# Patient Record
Sex: Female | Born: 1985 | Race: Black or African American | Hispanic: No | Marital: Single | Smoking: Former smoker
Health system: Southern US, Community
[De-identification: ages and names within clinical notes are randomized; demographics above are authoritative.]

## PROBLEM LIST (undated history)

## (undated) ENCOUNTER — Inpatient Hospital Stay (HOSPITAL_COMMUNITY): Payer: No Typology Code available for payment source

## (undated) DIAGNOSIS — G43909 Migraine, unspecified, not intractable, without status migrainosus: Secondary | ICD-10-CM

## (undated) DIAGNOSIS — D649 Anemia, unspecified: Secondary | ICD-10-CM

## (undated) DIAGNOSIS — Z8709 Personal history of other diseases of the respiratory system: Secondary | ICD-10-CM

## (undated) DIAGNOSIS — A630 Anogenital (venereal) warts: Secondary | ICD-10-CM

## (undated) DIAGNOSIS — A749 Chlamydial infection, unspecified: Secondary | ICD-10-CM

## (undated) DIAGNOSIS — I10 Essential (primary) hypertension: Secondary | ICD-10-CM

## (undated) DIAGNOSIS — K219 Gastro-esophageal reflux disease without esophagitis: Secondary | ICD-10-CM

## (undated) DIAGNOSIS — F329 Major depressive disorder, single episode, unspecified: Secondary | ICD-10-CM

## (undated) DIAGNOSIS — F32A Depression, unspecified: Secondary | ICD-10-CM

## (undated) DIAGNOSIS — K469 Unspecified abdominal hernia without obstruction or gangrene: Secondary | ICD-10-CM

## (undated) DIAGNOSIS — Z8669 Personal history of other diseases of the nervous system and sense organs: Secondary | ICD-10-CM

## (undated) DIAGNOSIS — O139 Gestational [pregnancy-induced] hypertension without significant proteinuria, unspecified trimester: Secondary | ICD-10-CM

## (undated) DIAGNOSIS — F419 Anxiety disorder, unspecified: Secondary | ICD-10-CM

## (undated) DIAGNOSIS — D573 Sickle-cell trait: Secondary | ICD-10-CM

## (undated) DIAGNOSIS — A599 Trichomoniasis, unspecified: Secondary | ICD-10-CM

## (undated) DIAGNOSIS — G479 Sleep disorder, unspecified: Secondary | ICD-10-CM

## (undated) DIAGNOSIS — E785 Hyperlipidemia, unspecified: Secondary | ICD-10-CM

## (undated) DIAGNOSIS — R87619 Unspecified abnormal cytological findings in specimens from cervix uteri: Secondary | ICD-10-CM

## (undated) DIAGNOSIS — B009 Herpesviral infection, unspecified: Secondary | ICD-10-CM

## (undated) DIAGNOSIS — R7989 Other specified abnormal findings of blood chemistry: Secondary | ICD-10-CM

## (undated) HISTORY — DX: Migraine, unspecified, not intractable, without status migrainosus: G43.909

## (undated) HISTORY — DX: Sleep disorder, unspecified: G47.9

## (undated) HISTORY — DX: Unspecified abnormal cytological findings in specimens from cervix uteri: R87.619

## (undated) HISTORY — DX: Trichomoniasis, unspecified: A59.9

## (undated) HISTORY — PX: INTRAUTERINE DEVICE (IUD) INSERTION: SHX5877

## (undated) HISTORY — DX: Other specified abnormal findings of blood chemistry: R79.89

## (undated) HISTORY — DX: Gastro-esophageal reflux disease without esophagitis: K21.9

## (undated) HISTORY — DX: Anogenital (venereal) warts: A63.0

## (undated) HISTORY — DX: Unspecified abdominal hernia without obstruction or gangrene: K46.9

## (undated) HISTORY — DX: Hyperlipidemia, unspecified: E78.5

## (undated) HISTORY — DX: Anemia, unspecified: D64.9

## (undated) HISTORY — PX: EYE SURGERY: SHX253

## (undated) HISTORY — DX: Chlamydial infection, unspecified: A74.9

## (undated) HISTORY — DX: Herpesviral infection, unspecified: B00.9

---
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
## 2006-01-19 ENCOUNTER — Emergency Department (HOSPITAL_COMMUNITY): Admission: EM | Admit: 2006-01-19 | Discharge: 2006-01-19 | Payer: Self-pay | Admitting: Emergency Medicine

## 2006-05-12 ENCOUNTER — Other Ambulatory Visit: Admission: RE | Admit: 2006-05-12 | Discharge: 2006-05-12 | Payer: Self-pay | Admitting: Obstetrics and Gynecology

## 2006-06-18 ENCOUNTER — Emergency Department (HOSPITAL_COMMUNITY): Admission: EM | Admit: 2006-06-18 | Discharge: 2006-06-18 | Payer: Self-pay | Admitting: Emergency Medicine

## 2006-08-15 ENCOUNTER — Ambulatory Visit (HOSPITAL_COMMUNITY): Admission: RE | Admit: 2006-08-15 | Discharge: 2006-08-15 | Payer: Self-pay | Admitting: Obstetrics and Gynecology

## 2006-10-28 ENCOUNTER — Ambulatory Visit (HOSPITAL_COMMUNITY): Admission: RE | Admit: 2006-10-28 | Discharge: 2006-10-28 | Payer: Self-pay | Admitting: Obstetrics and Gynecology

## 2006-11-10 ENCOUNTER — Ambulatory Visit (HOSPITAL_COMMUNITY): Admission: RE | Admit: 2006-11-10 | Discharge: 2006-11-10 | Payer: Self-pay | Admitting: Obstetrics and Gynecology

## 2006-12-14 ENCOUNTER — Emergency Department (HOSPITAL_COMMUNITY): Admission: EM | Admit: 2006-12-14 | Discharge: 2006-12-15 | Payer: Self-pay | Admitting: Emergency Medicine

## 2007-01-03 ENCOUNTER — Inpatient Hospital Stay (HOSPITAL_COMMUNITY): Admission: RE | Admit: 2007-01-03 | Discharge: 2007-01-10 | Payer: Self-pay | Admitting: Obstetrics and Gynecology

## 2008-01-27 ENCOUNTER — Emergency Department (HOSPITAL_COMMUNITY): Admission: EM | Admit: 2008-01-27 | Discharge: 2008-01-27 | Payer: Self-pay | Admitting: Emergency Medicine

## 2008-08-02 ENCOUNTER — Emergency Department (HOSPITAL_COMMUNITY): Admission: EM | Admit: 2008-08-02 | Discharge: 2008-08-02 | Payer: Self-pay | Admitting: Emergency Medicine

## 2008-12-02 ENCOUNTER — Emergency Department (HOSPITAL_COMMUNITY): Admission: EM | Admit: 2008-12-02 | Discharge: 2008-12-02 | Payer: Self-pay | Admitting: Emergency Medicine

## 2008-12-26 ENCOUNTER — Inpatient Hospital Stay (HOSPITAL_COMMUNITY): Admission: AD | Admit: 2008-12-26 | Discharge: 2008-12-27 | Payer: Self-pay | Admitting: Family Medicine

## 2008-12-28 ENCOUNTER — Ambulatory Visit (HOSPITAL_COMMUNITY): Admission: AD | Admit: 2008-12-28 | Discharge: 2008-12-28 | Payer: Self-pay | Admitting: Obstetrics & Gynecology

## 2008-12-29 ENCOUNTER — Ambulatory Visit (HOSPITAL_COMMUNITY): Admission: AD | Admit: 2008-12-29 | Discharge: 2008-12-29 | Payer: Self-pay | Admitting: Obstetrics & Gynecology

## 2009-01-08 ENCOUNTER — Inpatient Hospital Stay (HOSPITAL_COMMUNITY): Admission: RE | Admit: 2009-01-08 | Discharge: 2009-01-08 | Payer: Self-pay | Admitting: Obstetrics & Gynecology

## 2009-03-09 ENCOUNTER — Inpatient Hospital Stay (HOSPITAL_COMMUNITY): Admission: AD | Admit: 2009-03-09 | Discharge: 2009-03-09 | Payer: Self-pay | Admitting: Obstetrics and Gynecology

## 2009-04-13 ENCOUNTER — Inpatient Hospital Stay (HOSPITAL_COMMUNITY): Admission: AD | Admit: 2009-04-13 | Discharge: 2009-04-13 | Payer: Self-pay | Admitting: Obstetrics and Gynecology

## 2009-07-20 ENCOUNTER — Ambulatory Visit: Payer: Self-pay | Admitting: Physician Assistant

## 2009-07-20 ENCOUNTER — Inpatient Hospital Stay (HOSPITAL_COMMUNITY): Admission: AD | Admit: 2009-07-20 | Discharge: 2009-07-20 | Payer: Self-pay | Admitting: Obstetrics and Gynecology

## 2009-08-27 ENCOUNTER — Inpatient Hospital Stay (HOSPITAL_COMMUNITY): Admission: AD | Admit: 2009-08-27 | Discharge: 2009-08-27 | Payer: Self-pay | Admitting: Obstetrics & Gynecology

## 2009-08-27 ENCOUNTER — Inpatient Hospital Stay (HOSPITAL_COMMUNITY): Admission: AD | Admit: 2009-08-27 | Discharge: 2009-08-30 | Payer: Self-pay | Admitting: Obstetrics and Gynecology

## 2009-08-31 ENCOUNTER — Inpatient Hospital Stay (HOSPITAL_COMMUNITY): Admission: AD | Admit: 2009-08-31 | Discharge: 2009-09-03 | Payer: Self-pay | Admitting: Obstetrics and Gynecology

## 2009-09-22 ENCOUNTER — Inpatient Hospital Stay (HOSPITAL_COMMUNITY): Admission: AD | Admit: 2009-09-22 | Discharge: 2009-09-22 | Payer: Self-pay | Admitting: Obstetrics & Gynecology

## 2009-11-11 ENCOUNTER — Encounter: Admission: RE | Admit: 2009-11-11 | Discharge: 2009-11-11 | Payer: Self-pay | Admitting: Gastroenterology

## 2010-02-15 HISTORY — PX: OVARIAN CYST REMOVAL: SHX89

## 2010-03-01 ENCOUNTER — Inpatient Hospital Stay (HOSPITAL_COMMUNITY)
Admission: AD | Admit: 2010-03-01 | Discharge: 2010-03-01 | Payer: Self-pay | Source: Home / Self Care | Attending: Obstetrics & Gynecology | Admitting: Obstetrics & Gynecology

## 2010-03-02 LAB — URINALYSIS, ROUTINE W REFLEX MICROSCOPIC
Bilirubin Urine: NEGATIVE
Hgb urine dipstick: NEGATIVE
Ketones, ur: NEGATIVE mg/dL
Nitrite: NEGATIVE
Protein, ur: NEGATIVE mg/dL
Specific Gravity, Urine: 1.02 (ref 1.005–1.030)
Urine Glucose, Fasting: NEGATIVE mg/dL
Urobilinogen, UA: 0.2 mg/dL (ref 0.0–1.0)
pH: 5.5 (ref 5.0–8.0)

## 2010-03-02 LAB — POCT PREGNANCY, URINE: Preg Test, Ur: NEGATIVE

## 2010-03-26 ENCOUNTER — Encounter (HOSPITAL_COMMUNITY)
Admission: RE | Admit: 2010-03-26 | Discharge: 2010-03-26 | Disposition: A | Discharge status: Home / Self Care | Payer: Medicaid Other | Source: Ambulatory Visit | Attending: Obstetrics and Gynecology | Admitting: Obstetrics and Gynecology

## 2010-03-26 DIAGNOSIS — Z01812 Encounter for preprocedural laboratory examination: Secondary | ICD-10-CM | POA: Insufficient documentation

## 2010-03-26 LAB — SURGICAL PCR SCREEN
MRSA, PCR: NEGATIVE
Staphylococcus aureus: NEGATIVE

## 2010-03-26 LAB — COMPREHENSIVE METABOLIC PANEL
ALT: 14 U/L (ref 0–35)
AST: 16 U/L (ref 0–37)
Albumin: 3.6 g/dL (ref 3.5–5.2)
Alkaline Phosphatase: 62 U/L (ref 39–117)
BUN: 14 mg/dL (ref 6–23)
CO2: 25 mEq/L (ref 19–32)
Calcium: 8.7 mg/dL (ref 8.4–10.5)
Chloride: 108 mEq/L (ref 96–112)
Creatinine, Ser: 0.74 mg/dL (ref 0.4–1.2)
GFR calc Af Amer: >60 mL/min (ref >60)
GFR calc non Af Amer: >60 mL/min (ref >60)
Glucose, Bld: 75 mg/dL (ref 70–99)
Potassium: 3.9 mEq/L (ref 3.5–5.1)
Sodium: 138 mEq/L (ref 135–145)
Total Bilirubin: 0.5 mg/dL (ref 0.3–1.2)
Total Protein: 6.7 g/dL (ref 6.0–8.3)

## 2010-03-26 LAB — CBC
HCT: 35.9 % — ABNORMAL LOW (ref 36.0–46.0)
Hemoglobin: 11.7 g/dL — ABNORMAL LOW (ref 12.0–15.0)
MCH: 26.9 pg (ref 26.0–34.0)
MCHC: 32.6 g/dL (ref 30.0–36.0)
MCV: 82.5 fL (ref 78.0–100.0)
Platelets: 279 10*3/uL (ref 150–400)
RBC: 4.35 MIL/uL (ref 3.87–5.11)
RDW: 15.5 % (ref 11.5–15.5)
WBC: 5.4 10*3/uL (ref 4.0–10.5)

## 2010-03-26 LAB — URINALYSIS, ROUTINE W REFLEX MICROSCOPIC
Bilirubin Urine: NEGATIVE
Hgb urine dipstick: NEGATIVE
Ketones, ur: NEGATIVE mg/dL
Nitrite: NEGATIVE
Protein, ur: NEGATIVE mg/dL
Specific Gravity, Urine: 1.01 (ref 1.005–1.030)
Urine Glucose, Fasting: NEGATIVE mg/dL
Urobilinogen, UA: 0.2 mg/dL (ref 0.0–1.0)
pH: 6.5 (ref 5.0–8.0)

## 2010-03-27 ENCOUNTER — Inpatient Hospital Stay (INDEPENDENT_AMBULATORY_CARE_PROVIDER_SITE_OTHER)
Admission: RE | Admit: 2010-03-27 | Discharge: 2010-03-27 | Disposition: A | Discharge status: Home / Self Care | Payer: Medicaid Other | Source: Ambulatory Visit | Attending: Family Medicine | Admitting: Family Medicine

## 2010-03-27 ENCOUNTER — Inpatient Hospital Stay (HOSPITAL_COMMUNITY)
Admission: RE | Admit: 2010-03-27 | Discharge: 2010-03-27 | Disposition: A | Discharge status: Home / Self Care | Payer: Medicaid Other | Source: Ambulatory Visit

## 2010-03-27 DIAGNOSIS — J029 Acute pharyngitis, unspecified: Secondary | ICD-10-CM

## 2010-03-27 LAB — POCT RAPID STREP A (OFFICE): Streptococcus, Group A Screen (Direct): NEGATIVE

## 2010-03-30 ENCOUNTER — Ambulatory Visit (HOSPITAL_COMMUNITY)
Admission: RE | Admit: 2010-03-30 | Payer: Medicaid Other | Source: Ambulatory Visit | Admitting: Obstetrics and Gynecology

## 2010-04-07 ENCOUNTER — Ambulatory Visit (HOSPITAL_COMMUNITY)
Admission: RE | Admit: 2010-04-07 | Discharge: 2010-04-07 | Disposition: A | Discharge status: Home / Self Care | Payer: Medicaid Other | Source: Ambulatory Visit | Attending: Obstetrics and Gynecology | Admitting: Obstetrics and Gynecology

## 2010-04-07 ENCOUNTER — Other Ambulatory Visit: Payer: Self-pay | Admitting: Obstetrics and Gynecology

## 2010-04-07 DIAGNOSIS — D279 Benign neoplasm of unspecified ovary: Secondary | ICD-10-CM | POA: Insufficient documentation

## 2010-04-07 LAB — PREGNANCY, URINE: Preg Test, Ur: NEGATIVE

## 2010-04-24 NOTE — Op Note (Signed)
NAME:  Toni Warren, SPAID NO.:  [id number]  MEDICAL RECORD NO.:  [id number]           PATIENT TYPE:  O  LOCATION:  [facility]                          [facility]  PHYSICIAN:  Randye Lobo, M.D.   DATE OF BIRTH:  07-13-1985  DATE OF PROCEDURE:  04/07/2010 DATE OF DISCHARGE:  04/07/2010                              OPERATIVE REPORT   PREOPERATIVE DIAGNOSIS:  Right ovarian dermoid cyst.  POSTOPERATIVE DIAGNOSIS:  Right ovarian dermoid cyst.  PROCEDURE:  Laparoscopic right ovarian cystectomy.  SURGEON:  Randye Lobo, MD  ASSISTANT:  Luvenia Redden, MD  ANESTHESIA:  General endotracheal, local with 0.25% Marcaine.  IV FLUIDS:  1100 mL Ringer lactate.  ESTIMATED BLOOD LOSS:  100 mL.  URINE OUTPUT:  200 mL.  COMPLICATIONS:  None.  INDICATIONS FOR THE PROCEDURE:  The patient is a 25 year old para 2 African American female, who presents with right lower quadrant pain and a CT scan and a pelvic ultrasound demonstrating a complex right adnexal mass consistent with a 5-cm dermoid cyst.  The patient has had intermittent pain, and she now presents for removal of the dermoid cyst laparoscopically after risks, benefits, and alternatives have been reviewed.  FINDINGS:  Laparoscopy demonstrated a 5-cm right ovarian dermoid cyst. There was also a 1-cm right fallopian tube hydatid cyst.  The uterus, left tube and ovary, appendix, liver, gallbladder all appeared to be normal.  There was no evidence of any adhesive disease nor endometriosis in the abdomen or pelvis.  SPECIMENS:  The right ovarian cyst was sent to Pathology.  PROCEDURE IN DETAIL:  The patient was reidentified in the preoperative hold area.  She received clindamycin 900 mg IV for antibiotic prophylaxis and TED hose for DVT prophylaxis.  In the operating room, general endotracheal anesthesia was induced and the patient was then placed in the dorsal lithotomy position.  The abdomen, vagina, and  perineum were sterilely prepped and a Foley catheter was placed inside the bladder.  A speculum was placed inside the vagina and the single-tooth tenaculum was placed on the anterior cervical lip.  This was replaced with a Hulka tenaculum and the remaining vaginal instruments were removed.  The procedure began by creating a 1-cm infraumbilical incision with a scalpel.  A 10-mm trocar was inserted directly into the peritoneal cavity without difficulty.  The laparoscope confirmed proper placement. A CO2 pneumoperitoneum was achieved, and the patient was placed in the Trendelenburg position.  A 5-mm right and left lower quadrant incisions were then created with a scalpel and 5-mm trocars were placed under direct visualization of the laparoscope.  An inspection of the pelvic and abdominal organs was performed.  The procedure began by grasping the utero-ovarian ligament and then using monopolar cautery to score the ovarian cortex overlying the ovarian cyst.  With a combination of sharp and blunt dissection and monopolar cautery, the ovarian cyst was then shelled out of the normal ovarian tissue.  The cyst remained intact.  Hemostasis at the hilum was created with the Kleppinger bipolar forceps.  The right lower quadrant incision was then extended to a 10 and 11 mm length and  10 and 11-mm disposable trocar was then placed in the right lower quadrant under visualization of the laparoscope.  An EndoCatch bag was placed inside the peritoneal cavity and the specimen was placed inside the bag and was then drawn up and out through the right lower quadrant incision after the cyst was opened to drain the sebaceous material.  The right lower quadrant trocar was then replaced under visualization of the laparoscope and the abdomen was then irrigated and suctioned and hemostasis was good at the operative site.  Intercede was then placed intraperitoneally and wrapped around the right adnexal  structures.  The lower abdominal trocars were removed under visualization of the laparoscope.  The pneumoperitoneum was released, and the umbilical trocar and the 10-mm laparoscope were removed from the umbilicus simultaneously.  The umbilical and right lower quadrant fascial closures were performed with figure-of-eight sutures of 0 Vicryl.  All of the skin incisions were then closed with a subcuticular sutures of 3-0 plain.  Marcaine 0.25% was injected at all of the incisions.  Octylseal was then placed over the right and left lower quadrant incisions and a pressure bandage was placed over the umbilical trocar site.  The tenaculum and the Foley catheter were removed.  I left the operating room at this point when the Anesthesia team began waking up the patient.  I was called back as the patient was noted to have vaginal bleeding.  I placed in a sterile speculum inside the vagina and wiped any remaining blood.  There was no evidence of any active bleeding.  I believe that the bleeding may have been from the tenaculum site, but this was now not showing any signs of active bleeding.  I removed all of these vaginal instruments.  There were no complications to the procedure.  Needle, instrument, sponge counts were correct.  The patient was escorted to the recovery room in stable and awake condition.     Randye Lobo, M.D.     BES/MEDQ  D:  04/07/2010  T:  04/08/2010  Job:  [id number]  Electronically Signed by Conley Simmonds M.D. on 04/23/2010 10:58:15 AM

## 2010-05-01 LAB — URINALYSIS, ROUTINE W REFLEX MICROSCOPIC
Bilirubin Urine: NEGATIVE
Glucose, UA: NEGATIVE mg/dL
Ketones, ur: NEGATIVE mg/dL
Nitrite: NEGATIVE
Protein, ur: NEGATIVE mg/dL
Specific Gravity, Urine: 1.02 (ref 1.005–1.030)
Urobilinogen, UA: 0.2 mg/dL (ref 0.0–1.0)
pH: 5.5 (ref 5.0–8.0)

## 2010-05-01 LAB — URINE MICROSCOPIC-ADD ON

## 2010-05-02 LAB — COMPREHENSIVE METABOLIC PANEL
ALT: 93 U/L — ABNORMAL HIGH (ref 0–35)
ALT: 93 U/L — ABNORMAL HIGH (ref 0–35)
ALT: 94 U/L — ABNORMAL HIGH (ref 0–35)
AST: 122 U/L — ABNORMAL HIGH (ref 0–37)
AST: 133 U/L — ABNORMAL HIGH (ref 0–37)
AST: 88 U/L — ABNORMAL HIGH (ref 0–37)
Albumin: 2.6 g/dL — ABNORMAL LOW (ref 3.5–5.2)
Albumin: 2.8 g/dL — ABNORMAL LOW (ref 3.5–5.2)
Albumin: 3 g/dL — ABNORMAL LOW (ref 3.5–5.2)
Alkaline Phosphatase: 226 U/L — ABNORMAL HIGH (ref 39–117)
Alkaline Phosphatase: 228 U/L — ABNORMAL HIGH (ref 39–117)
Alkaline Phosphatase: 246 U/L — ABNORMAL HIGH (ref 39–117)
BUN: 10 mg/dL (ref 6–23)
BUN: 11 mg/dL (ref 6–23)
BUN: 12 mg/dL (ref 6–23)
CO2: 22 mEq/L (ref 19–32)
CO2: 24 mEq/L (ref 19–32)
CO2: 26 mEq/L (ref 19–32)
Calcium: 6.4 mg/dL — CL (ref 8.4–10.5)
Calcium: 7.7 mg/dL — ABNORMAL LOW (ref 8.4–10.5)
Calcium: 8.7 mg/dL (ref 8.4–10.5)
Chloride: 105 mEq/L (ref 96–112)
Chloride: 106 mEq/L (ref 96–112)
Chloride: 107 mEq/L (ref 96–112)
Creatinine, Ser: 0.67 mg/dL (ref 0.4–1.2)
Creatinine, Ser: 0.68 mg/dL (ref 0.4–1.2)
Creatinine, Ser: 0.71 mg/dL (ref 0.4–1.2)
GFR calc Af Amer: >60 mL/min (ref >60)
GFR calc Af Amer: >60 mL/min (ref >60)
GFR calc Af Amer: >60 mL/min (ref >60)
GFR calc non Af Amer: >60 mL/min (ref >60)
GFR calc non Af Amer: >60 mL/min (ref >60)
GFR calc non Af Amer: >60 mL/min (ref >60)
Glucose, Bld: 103 mg/dL — ABNORMAL HIGH (ref 70–99)
Glucose, Bld: 80 mg/dL (ref 70–99)
Glucose, Bld: 86 mg/dL (ref 70–99)
Potassium: 3.9 mEq/L (ref 3.5–5.1)
Potassium: 3.9 mEq/L (ref 3.5–5.1)
Potassium: 4.2 mEq/L (ref 3.5–5.1)
Sodium: 137 mEq/L (ref 135–145)
Sodium: 137 mEq/L (ref 135–145)
Sodium: 138 mEq/L (ref 135–145)
Total Bilirubin: 0.3 mg/dL (ref 0.3–1.2)
Total Bilirubin: 0.3 mg/dL (ref 0.3–1.2)
Total Bilirubin: 0.5 mg/dL (ref 0.3–1.2)
Total Protein: 5.9 g/dL — ABNORMAL LOW (ref 6.0–8.3)
Total Protein: 6 g/dL (ref 6.0–8.3)
Total Protein: 6.4 g/dL (ref 6.0–8.3)

## 2010-05-02 LAB — CBC
HCT: 18.5 % — ABNORMAL LOW (ref 36.0–46.0)
HCT: 24.1 % — ABNORMAL LOW (ref 36.0–46.0)
HCT: 24.3 % — ABNORMAL LOW (ref 36.0–46.0)
HCT: 24.8 % — ABNORMAL LOW (ref 36.0–46.0)
Hemoglobin: 5.8 g/dL — CL (ref 12.0–15.0)
Hemoglobin: 7.7 g/dL — ABNORMAL LOW (ref 12.0–15.0)
Hemoglobin: 7.8 g/dL — ABNORMAL LOW (ref 12.0–15.0)
Hemoglobin: 7.9 g/dL — ABNORMAL LOW (ref 12.0–15.0)
MCH: 21.6 pg — ABNORMAL LOW (ref 26.0–34.0)
MCH: 22 pg — ABNORMAL LOW (ref 26.0–34.0)
MCH: 22.1 pg — ABNORMAL LOW (ref 26.0–34.0)
MCH: 22.3 pg — ABNORMAL LOW (ref 26.0–34.0)
MCHC: 31.3 g/dL (ref 30.0–36.0)
MCHC: 31.7 g/dL (ref 30.0–36.0)
MCHC: 32 g/dL (ref 30.0–36.0)
MCHC: 32.1 g/dL (ref 30.0–36.0)
MCV: 68.9 fL — ABNORMAL LOW (ref 78.0–100.0)
MCV: 69.1 fL — ABNORMAL LOW (ref 78.0–100.0)
MCV: 69.3 fL — ABNORMAL LOW (ref 78.0–100.0)
MCV: 69.8 fL — ABNORMAL LOW (ref 78.0–100.0)
Platelets: 272 10*3/uL (ref 150–400)
Platelets: 438 10*3/uL — ABNORMAL HIGH (ref 150–400)
Platelets: 444 10*3/uL — ABNORMAL HIGH (ref 150–400)
Platelets: 450 10*3/uL — ABNORMAL HIGH (ref 150–400)
RBC: 2.67 MIL/uL — ABNORMAL LOW (ref 3.87–5.11)
RBC: 3.48 MIL/uL — ABNORMAL LOW (ref 3.87–5.11)
RBC: 3.49 MIL/uL — ABNORMAL LOW (ref 3.87–5.11)
RBC: 3.59 MIL/uL — ABNORMAL LOW (ref 3.87–5.11)
RDW: 21.3 % — ABNORMAL HIGH (ref 11.5–15.5)
RDW: 21.8 % — ABNORMAL HIGH (ref 11.5–15.5)
RDW: 22.1 % — ABNORMAL HIGH (ref 11.5–15.5)
RDW: 23.1 % — ABNORMAL HIGH (ref 11.5–15.5)
WBC: 10.5 10*3/uL (ref 4.0–10.5)
WBC: 10.9 10*3/uL — ABNORMAL HIGH (ref 4.0–10.5)
WBC: 11.3 10*3/uL — ABNORMAL HIGH (ref 4.0–10.5)
WBC: 9.7 10*3/uL (ref 4.0–10.5)

## 2010-05-02 LAB — URINALYSIS, ROUTINE W REFLEX MICROSCOPIC
Bilirubin Urine: NEGATIVE
Glucose, UA: NEGATIVE mg/dL
Ketones, ur: NEGATIVE mg/dL
Nitrite: NEGATIVE
Protein, ur: NEGATIVE mg/dL
Specific Gravity, Urine: 1.01 (ref 1.005–1.030)
Urobilinogen, UA: 0.2 mg/dL (ref 0.0–1.0)
pH: 7.5 (ref 5.0–8.0)

## 2010-05-02 LAB — URINE MICROSCOPIC-ADD ON

## 2010-05-02 LAB — LACTATE DEHYDROGENASE
LDH: 339 U/L — ABNORMAL HIGH (ref 94–250)
LDH: 365 U/L — ABNORMAL HIGH (ref 94–250)
LDH: 384 U/L — ABNORMAL HIGH (ref 94–250)

## 2010-05-02 LAB — MAGNESIUM
Magnesium: 5.8 mg/dL — ABNORMAL HIGH (ref 1.5–2.5)
Magnesium: 7.1 mg/dL (ref 1.5–2.5)

## 2010-05-02 LAB — URIC ACID
Uric Acid, Serum: 5.7 mg/dL (ref 2.4–7.0)
Uric Acid, Serum: 6 mg/dL (ref 2.4–7.0)
Uric Acid, Serum: 6.2 mg/dL (ref 2.4–7.0)

## 2010-05-02 LAB — MRSA PCR SCREENING: MRSA by PCR: NEGATIVE

## 2010-05-03 LAB — PROTEIN / CREATININE RATIO, URINE
Creatinine, Urine: 72.5 mg/dL
Protein Creatinine Ratio: 0.25 — ABNORMAL HIGH (ref 0.00–0.15)
Total Protein, Urine: 18 mg/dL

## 2010-05-03 LAB — COMPREHENSIVE METABOLIC PANEL
ALT: 18 U/L (ref 0–35)
AST: 31 U/L (ref 0–37)
Albumin: 2.6 g/dL — ABNORMAL LOW (ref 3.5–5.2)
Alkaline Phosphatase: 242 U/L — ABNORMAL HIGH (ref 39–117)
BUN: 8 mg/dL (ref 6–23)
CO2: 21 mEq/L (ref 19–32)
Calcium: 8.6 mg/dL (ref 8.4–10.5)
Chloride: 108 mEq/L (ref 96–112)
Creatinine, Ser: 0.57 mg/dL (ref 0.4–1.2)
GFR calc Af Amer: >60 mL/min (ref >60)
GFR calc non Af Amer: >60 mL/min (ref >60)
Glucose, Bld: 93 mg/dL (ref 70–99)
Potassium: 4.2 mEq/L (ref 3.5–5.1)
Sodium: 136 mEq/L (ref 135–145)
Total Bilirubin: 0.6 mg/dL (ref 0.3–1.2)
Total Protein: 5.3 g/dL — ABNORMAL LOW (ref 6.0–8.3)

## 2010-05-03 LAB — URIC ACID: Uric Acid, Serum: 4.5 mg/dL (ref 2.4–7.0)

## 2010-05-03 LAB — URINALYSIS, ROUTINE W REFLEX MICROSCOPIC
Bilirubin Urine: NEGATIVE
Bilirubin Urine: NEGATIVE
Glucose, UA: NEGATIVE mg/dL
Glucose, UA: NEGATIVE mg/dL
Hgb urine dipstick: NEGATIVE
Hgb urine dipstick: NEGATIVE
Ketones, ur: NEGATIVE mg/dL
Ketones, ur: NEGATIVE mg/dL
Nitrite: NEGATIVE
Nitrite: NEGATIVE
Protein, ur: NEGATIVE mg/dL
Protein, ur: NEGATIVE mg/dL
Specific Gravity, Urine: 1.01 (ref 1.005–1.030)
Specific Gravity, Urine: 1.02 (ref 1.005–1.030)
Urobilinogen, UA: 0.2 mg/dL (ref 0.0–1.0)
Urobilinogen, UA: 0.2 mg/dL (ref 0.0–1.0)
pH: 6 (ref 5.0–8.0)
pH: 6 (ref 5.0–8.0)

## 2010-05-03 LAB — CBC
HCT: 25.3 % — ABNORMAL LOW (ref 36.0–46.0)
HCT: 25.7 % — ABNORMAL LOW (ref 36.0–46.0)
Hemoglobin: 8 g/dL — ABNORMAL LOW (ref 12.0–15.0)
Hemoglobin: 8.2 g/dL — ABNORMAL LOW (ref 12.0–15.0)
MCH: 21.4 pg — ABNORMAL LOW (ref 26.0–34.0)
MCH: 21.8 pg — ABNORMAL LOW (ref 26.0–34.0)
MCHC: 31.7 g/dL (ref 30.0–36.0)
MCHC: 32 g/dL (ref 30.0–36.0)
MCV: 67.6 fL — ABNORMAL LOW (ref 78.0–100.0)
MCV: 68.1 fL — ABNORMAL LOW (ref 78.0–100.0)
Platelets: 301 10*3/uL (ref 150–400)
Platelets: 305 10*3/uL (ref 150–400)
RBC: 3.74 MIL/uL — ABNORMAL LOW (ref 3.87–5.11)
RBC: 3.77 MIL/uL — ABNORMAL LOW (ref 3.87–5.11)
RDW: 21.3 % — ABNORMAL HIGH (ref 11.5–15.5)
RDW: 21.5 % — ABNORMAL HIGH (ref 11.5–15.5)
WBC: 6.6 10*3/uL (ref 4.0–10.5)
WBC: 6.6 10*3/uL (ref 4.0–10.5)

## 2010-05-03 LAB — LACTATE DEHYDROGENASE: LDH: 244 U/L (ref 94–250)

## 2010-05-03 LAB — RPR: RPR Ser Ql: NONREACTIVE

## 2010-05-04 LAB — URINALYSIS, ROUTINE W REFLEX MICROSCOPIC
Bilirubin Urine: NEGATIVE
Glucose, UA: NEGATIVE mg/dL
Hgb urine dipstick: NEGATIVE
Ketones, ur: NEGATIVE mg/dL
Nitrite: NEGATIVE
Protein, ur: NEGATIVE mg/dL
Specific Gravity, Urine: 1.015 (ref 1.005–1.030)
Urobilinogen, UA: 0.2 mg/dL (ref 0.0–1.0)
pH: 5.5 (ref 5.0–8.0)

## 2010-05-04 LAB — WET PREP, GENITAL
Clue Cells Wet Prep HPF POC: NONE SEEN
Trich, Wet Prep: NONE SEEN
Yeast Wet Prep HPF POC: NONE SEEN

## 2010-05-20 LAB — GC/CHLAMYDIA PROBE AMP, GENITAL
Chlamydia, DNA Probe: NEGATIVE
GC Probe Amp, Genital: NEGATIVE

## 2010-05-20 LAB — URINALYSIS, ROUTINE W REFLEX MICROSCOPIC
Bilirubin Urine: NEGATIVE
Glucose, UA: NEGATIVE mg/dL
Hgb urine dipstick: NEGATIVE
Ketones, ur: NEGATIVE mg/dL
Nitrite: NEGATIVE
Protein, ur: NEGATIVE mg/dL
Specific Gravity, Urine: 1.015 (ref 1.005–1.030)
Urobilinogen, UA: 0.2 mg/dL (ref 0.0–1.0)
pH: 5 (ref 5.0–8.0)

## 2010-05-20 LAB — WET PREP, GENITAL
Clue Cells Wet Prep HPF POC: NONE SEEN
Trich, Wet Prep: NONE SEEN
Yeast Wet Prep HPF POC: NONE SEEN

## 2010-05-20 LAB — HCG, QUANTITATIVE, PREGNANCY
hCG, Beta Chain, Quant, S: 2972 m[IU]/mL — ABNORMAL HIGH (ref <5)
hCG, Beta Chain, Quant, S: 947 m[IU]/mL — ABNORMAL HIGH (ref <5)

## 2010-07-03 NOTE — Discharge Summary (Signed)
Toni Warren, Toni Warren                ACCOUNT NO.:  [id number]   MEDICAL RECORD NO.:  [id number]          PATIENT TYPE:  INP   LOCATION:  9107                          [facility]ILITY:  [facility]   PHYSICIAN:  Rudy Jew. [name], M.D.DATE OF BIRTH:  [birth year]   DATE OF ADMISSION:  01/03/2007  DATE OF DISCHARGE:  01/10/2007                               DISCHARGE SUMMARY   DISCHARGE DIAGNOSES:  1. Intrauterine pregnancy at 25 weeks' gestation, delivered.  2. Sickle trait.  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  5. Preeclampsia - improved.   OPERATIONS AND PROCEDURES:  OB delivery with episiotomy, episiorrhaphy.   CONSULTATIONS:  Dr. Eustaquio Boyden. (MFM).   DISCHARGE MEDICATIONS:  1. Labetalol 300 mg p.o. b.i.d.  2. Prenatal vitamins.   HISTORY AND PHYSICAL:  This is a 25 year old gravida 2, para 0, AB 1 at  [redacted] weeks gestation.  Prenatal care was complicate by the aforementioned  diagnoses.  The patient admitted for induction, secondary to advanced  cervical changes, musculoskeletal discomfort.  Initial blood pressures  were in the 149 to 153 systolic over 88 diastolic range.  Initial  cervical examination by me revealed a cervical dilatation of 4 cm with  75% effacement, -2 station, vertex presentation.   HOSPITAL COURSE:  The patient was admitted to [facility] of  [place].  Admission laboratory studies were drawn.  Artificial  rupture of membranes was accomplished and intrauterine pressure catheter  placed.  The patient was given Pitocin.  She went on to labor and  deliver on January 03, 2007.  The infant was a 7-pound, 2-ounce female,  Apgars 8 at 1 minute, 9 at 5 minutes,  sent to the newborn nursery.  Delivery was accomplished by Dr. Sylvester Harder over second-degree  midline episiotomy.  There were no lacerations.  On January 05, 2007,  the patient was noted to have some blood pressures which were borderline  elevated.  PIH panel was obtained and revealed  modestly-elevated SGOT  and SGPT.  The patient was given IV magnesium sulfate prophylaxis.  After approximately 24 hours, the magnesium sulfate was discontinued.  Blood pressures continued to be modestly elevated, and the SGOT and PT  continued to rise modestly.  The patient was maintained in the hospital,  and labetalol was given an effort to optimize the blood pressure.  The  case was discussed with Dr. Eustaquio Boyden on January 08, 2007.  By January 10, 2007, the SGOT and SGPT were noted to be falling.  The blood  pressure was 134/93.  The patient was felt to be a candidate for  discharge and was discharged home on labetalol 300 mg b.i.d., afebrile  and in satisfactory condition.   DISPOSITION:  The patient is to return to [facility] gynecology and  obstetrics on December 1, for further evaluation therapy.      Toni Warren, M.D.  Electronically Signed     JAM/MEDQ  D:  01/25/2007  T:  01/26/2007  Job:  [id number]

## 2010-08-17 ENCOUNTER — Encounter (INDEPENDENT_AMBULATORY_CARE_PROVIDER_SITE_OTHER): Admitting: Surgery

## 2010-11-24 LAB — COMPREHENSIVE METABOLIC PANEL
ALT: 25
ALT: 42 — ABNORMAL HIGH
ALT: 51 — ABNORMAL HIGH
ALT: 52 — ABNORMAL HIGH
ALT: 71 — ABNORMAL HIGH
ALT: 83 — ABNORMAL HIGH
AST: 116 — ABNORMAL HIGH
AST: 35
AST: 57 — ABNORMAL HIGH
AST: 59 — ABNORMAL HIGH
AST: 62 — ABNORMAL HIGH
AST: 88 — ABNORMAL HIGH
Albumin: 2.4 — ABNORMAL LOW
Albumin: 2.5 — ABNORMAL LOW
Albumin: 2.5 — ABNORMAL LOW
Albumin: 2.5 — ABNORMAL LOW
Albumin: 2.7 — ABNORMAL LOW
Albumin: 3 — ABNORMAL LOW
Alkaline Phosphatase: 147 — ABNORMAL HIGH
Alkaline Phosphatase: 148 — ABNORMAL HIGH
Alkaline Phosphatase: 154 — ABNORMAL HIGH
Alkaline Phosphatase: 154 — ABNORMAL HIGH
Alkaline Phosphatase: 157 — ABNORMAL HIGH
Alkaline Phosphatase: 165 — ABNORMAL HIGH
BUN: 10
BUN: 10
BUN: 5 — ABNORMAL LOW
BUN: 8
BUN: 9
BUN: 9
CO2: 20
CO2: 23
CO2: 23
CO2: 24
CO2: 24
CO2: 27
Calcium: 7.7 — ABNORMAL LOW
Calcium: 8.1 — ABNORMAL LOW
Calcium: 8.9
Calcium: 8.9
Calcium: 9
Calcium: 9.1
Chloride: 105
Chloride: 105
Chloride: 105
Chloride: 107
Chloride: 108
Chloride: 108
Creatinine, Ser: 0.65
Creatinine, Ser: 0.72
Creatinine, Ser: 0.73
Creatinine, Ser: 0.75
Creatinine, Ser: 0.79
Creatinine, Ser: 0.81
GFR calc Af Amer: >60
GFR calc Af Amer: >60
GFR calc Af Amer: >60
GFR calc Af Amer: >60
GFR calc Af Amer: >60
GFR calc Af Amer: >60
GFR calc non Af Amer: >60
GFR calc non Af Amer: >60
GFR calc non Af Amer: >60
GFR calc non Af Amer: >60
GFR calc non Af Amer: >60
GFR calc non Af Amer: >60
Glucose, Bld: 107 — ABNORMAL HIGH
Glucose, Bld: 69 — ABNORMAL LOW
Glucose, Bld: 76
Glucose, Bld: 82
Glucose, Bld: 90
Glucose, Bld: 92
Potassium: 3.9
Potassium: 4
Potassium: 4.1
Potassium: 4.2
Potassium: 4.2
Potassium: 4.3
Sodium: 135
Sodium: 137
Sodium: 138
Sodium: 139
Sodium: 139
Sodium: 140
Total Bilirubin: 0.5
Total Bilirubin: 0.5
Total Bilirubin: 0.6
Total Bilirubin: 0.6
Total Bilirubin: 0.8
Total Bilirubin: 0.8
Total Protein: 5.2 — ABNORMAL LOW
Total Protein: 5.3 — ABNORMAL LOW
Total Protein: 5.5 — ABNORMAL LOW
Total Protein: 5.8 — ABNORMAL LOW
Total Protein: 6.2
Total Protein: 6.7

## 2010-11-24 LAB — CBC
HCT: 27.6 — ABNORMAL LOW
HCT: 29.2 — ABNORMAL LOW
HCT: 29.7 — ABNORMAL LOW
HCT: 29.7 — ABNORMAL LOW
HCT: 30.2 — ABNORMAL LOW
HCT: 33.2 — ABNORMAL LOW
HCT: 35.2 — ABNORMAL LOW
Hemoglobin: 10.9 — ABNORMAL LOW
Hemoglobin: 11.5 — ABNORMAL LOW
Hemoglobin: 9.1 — ABNORMAL LOW
Hemoglobin: 9.5 — ABNORMAL LOW
Hemoglobin: 9.6 — ABNORMAL LOW
Hemoglobin: 9.9 — ABNORMAL LOW
Hemoglobin: 9.9 — ABNORMAL LOW
MCHC: 32.3
MCHC: 32.6
MCHC: 32.7
MCHC: 32.7
MCHC: 32.9
MCHC: 32.9
MCHC: 33.1
MCV: 73.5 — ABNORMAL LOW
MCV: 74 — ABNORMAL LOW
MCV: 74.1 — ABNORMAL LOW
MCV: 74.4 — ABNORMAL LOW
MCV: 74.4 — ABNORMAL LOW
MCV: 74.7 — ABNORMAL LOW
MCV: 74.7 — ABNORMAL LOW
Platelets: 350
Platelets: 389
Platelets: 394
Platelets: 423 — ABNORMAL HIGH
Platelets: 468 — ABNORMAL HIGH
Platelets: 472 — ABNORMAL HIGH
Platelets: 491 — ABNORMAL HIGH
RBC: 3.73 — ABNORMAL LOW
RBC: 3.9
RBC: 3.97
RBC: 3.99
RBC: 4.06
RBC: 4.52
RBC: 4.75
RDW: 18.1 — ABNORMAL HIGH
RDW: 18.4 — ABNORMAL HIGH
RDW: 18.5 — ABNORMAL HIGH
RDW: 18.7 — ABNORMAL HIGH
RDW: 18.7 — ABNORMAL HIGH
RDW: 18.9 — ABNORMAL HIGH
RDW: 19 — ABNORMAL HIGH
WBC: 12.5 — ABNORMAL HIGH
WBC: 6.1
WBC: 7.7
WBC: 7.7
WBC: 8.1
WBC: 9.1
WBC: 9.6

## 2010-11-24 LAB — HEPATIC FUNCTION PANEL
ALT: 74 — ABNORMAL HIGH
AST: 91 — ABNORMAL HIGH
Albumin: 2.8 — ABNORMAL LOW
Alkaline Phosphatase: 141 — ABNORMAL HIGH
Bilirubin, Direct: <0.1
Total Bilirubin: 0.7
Total Protein: 6.4

## 2010-11-24 LAB — DIFFERENTIAL
Basophils Absolute: 0
Basophils Absolute: 0
Basophils Relative: 0
Basophils Relative: 1
Eosinophils Absolute: 0.3
Eosinophils Absolute: 0.4
Eosinophils Relative: 4
Eosinophils Relative: 6 — ABNORMAL HIGH
Lymphocytes Relative: 24
Lymphocytes Relative: 25
Lymphs Abs: 1.8
Lymphs Abs: 1.9
Monocytes Absolute: 0.7
Monocytes Absolute: 0.7
Monocytes Relative: 9
Monocytes Relative: 9
Neutro Abs: 4.7
Neutro Abs: 4.8
Neutrophils Relative %: 61
Neutrophils Relative %: 63

## 2010-11-24 LAB — HEPATITIS PANEL, ACUTE
HCV Ab: NEGATIVE
Hep A IgM: NEGATIVE
Hep B C IgM: NEGATIVE
Hepatitis B Surface Ag: NEGATIVE

## 2010-11-24 LAB — URINE MICROSCOPIC-ADD ON

## 2010-11-24 LAB — LACTATE DEHYDROGENASE
LDH: 243
LDH: 245
LDH: 246
LDH: 260 — ABNORMAL HIGH
LDH: 284 — ABNORMAL HIGH
LDH: 298 — ABNORMAL HIGH

## 2010-11-24 LAB — URINALYSIS, ROUTINE W REFLEX MICROSCOPIC
Bilirubin Urine: NEGATIVE
Glucose, UA: NEGATIVE
Ketones, ur: NEGATIVE
Leukocytes, UA: NEGATIVE
Nitrite: NEGATIVE
Protein, ur: NEGATIVE
Specific Gravity, Urine: <1.005 — ABNORMAL LOW
Urobilinogen, UA: 0.2
pH: 7

## 2010-11-24 LAB — URIC ACID
Uric Acid, Serum: 3.7
Uric Acid, Serum: 5
Uric Acid, Serum: 5.1
Uric Acid, Serum: 5.5
Uric Acid, Serum: 5.6
Uric Acid, Serum: 5.8

## 2010-11-24 LAB — ABO/RH: ABO/RH(D): O POS

## 2010-11-24 LAB — URINALYSIS, DIPSTICK ONLY
Bilirubin Urine: NEGATIVE
Glucose, UA: NEGATIVE
Ketones, ur: NEGATIVE
Nitrite: NEGATIVE
Protein, ur: NEGATIVE
Specific Gravity, Urine: <1.005 — ABNORMAL LOW
Urobilinogen, UA: 0.2
pH: 6

## 2010-11-24 LAB — RPR: RPR Ser Ql: NONREACTIVE

## 2010-11-24 LAB — TYPE AND SCREEN
ABO/RH(D): O POS
Antibody Screen: NEGATIVE

## 2010-11-25 LAB — CBC
HCT: 30.1 — ABNORMAL LOW
Hemoglobin: 9.8 — ABNORMAL LOW
MCHC: 32.7
MCV: 75.4 — ABNORMAL LOW
Platelets: 438 — ABNORMAL HIGH
RBC: 3.99
RDW: 18 — ABNORMAL HIGH
WBC: 6.9

## 2010-11-25 LAB — URINALYSIS, ROUTINE W REFLEX MICROSCOPIC
Bilirubin Urine: NEGATIVE
Glucose, UA: NEGATIVE
Hgb urine dipstick: NEGATIVE
Ketones, ur: NEGATIVE
Nitrite: NEGATIVE
Protein, ur: NEGATIVE
Specific Gravity, Urine: 1.02
Urobilinogen, UA: 0.2
pH: 6

## 2010-11-25 LAB — COMPREHENSIVE METABOLIC PANEL
ALT: <8
AST: 26
Albumin: 2.6 — ABNORMAL LOW
Alkaline Phosphatase: 151 — ABNORMAL HIGH
BUN: 10
CO2: 25
Calcium: 9.3
Chloride: 107
Creatinine, Ser: 0.61
GFR calc Af Amer: >60
GFR calc non Af Amer: >60
Glucose, Bld: 103 — ABNORMAL HIGH
Potassium: 4.1
Sodium: 137
Total Bilirubin: 0.4
Total Protein: 5.6 — ABNORMAL LOW

## 2010-11-25 LAB — URINE MICROSCOPIC-ADD ON

## 2010-11-25 LAB — PROTIME-INR
INR: 1
Prothrombin Time: 13.4

## 2010-11-25 LAB — APTT: aPTT: 26

## 2011-05-21 ENCOUNTER — Telehealth (INDEPENDENT_AMBULATORY_CARE_PROVIDER_SITE_OTHER): Payer: Self-pay | Admitting: Surgery

## 2011-05-21 NOTE — Telephone Encounter (Signed)
Patient is now ready to schedule surgery with Dr. Luisa Hart and Dr. Kelly Splinter for diastasis. Please call

## 2011-06-14 ENCOUNTER — Encounter (INDEPENDENT_AMBULATORY_CARE_PROVIDER_SITE_OTHER): Payer: Self-pay

## 2011-06-15 ENCOUNTER — Ambulatory Visit (INDEPENDENT_AMBULATORY_CARE_PROVIDER_SITE_OTHER): Admitting: Surgery

## 2011-06-15 ENCOUNTER — Encounter (INDEPENDENT_AMBULATORY_CARE_PROVIDER_SITE_OTHER): Payer: Self-pay | Admitting: Surgery

## 2011-06-15 VITALS — BP 132/84 | HR 71 | Temp 97.2°F | Resp 16 | Ht 62.0 in | Wt 151.2 lb

## 2011-06-15 DIAGNOSIS — K439 Ventral hernia without obstruction or gangrene: Secondary | ICD-10-CM

## 2011-06-15 NOTE — Progress Notes (Signed)
Patient ID: Toni Warren, female   DOB: [birth year], 26 y.o.   MRN: [id number]  No chief complaint on file.   HPI Toni Warren is a 25 y.o. female.   HPI The patient returns to clinic for follow up of her abdominal wall diastases. She is having no abdominal distention and abdominal discomfort now. She denies any nausea or vomiting. The pain is gone nature made worse when she stands and putspressure her abdominal wall.  Past Medical History  Diagnosis Date  . Anemia   . GERD (gastroesophageal reflux disease)   . Hyperlipidemia   . Gestational HTN     Past Surgical History  Procedure Date  . Ovarian cyst removal 2012  . Eye surgery     No family history on file.  Social History History  Substance Use Topics  . Smoking status: Passive Smoker  . Smokeless tobacco: Not on file  . Alcohol Use: No    Allergies  Allergen Reactions  . Percocet (Oxycodone-Acetaminophen) Itching    Current Outpatient Prescriptions  Medication Sig Dispense Refill  . ALPRAZolam (XANAX) 0.25 MG tablet Take 0.25 mg by mouth at bedtime as needed.      . citalopram (CELEXA) 10 MG tablet Take 10 mg by mouth daily.      Marland Kitchen levonorgestrel-ethinyl estradiol (SEASONALE,INTROVALE,JOLESSA) 0.15-0.03 MG tablet Take 1 tablet by mouth daily.        Review of Systems Review of Systems  Constitutional: Negative for fever, chills and unexpected weight change.  HENT: Negative for hearing loss, congestion, sore throat, trouble swallowing and voice change.   Eyes: Negative for visual disturbance.  Respiratory: Negative for cough and wheezing.   Cardiovascular: Negative for chest pain, palpitations and leg swelling.  Gastrointestinal: Positive for abdominal pain and abdominal distention. Negative for nausea, vomiting, diarrhea, constipation, blood in stool and anal bleeding.  Genitourinary: Negative for hematuria, vaginal bleeding and difficulty urinating.  Musculoskeletal: Negative for arthralgias.  Skin:  Negative for rash and wound.  Neurological: Negative for seizures, syncope and headaches.  Hematological: Negative for adenopathy. Does not bruise/bleed easily.  Psychiatric/Behavioral: Negative for confusion.    Blood pressure 132/84, pulse 71, temperature 97.2 F (36.2 C), temperature source Temporal, resp. rate 16, height 5\' 2"  (1.575 m), weight 151 lb 3.2 oz (68.584 kg).  Physical Exam Physical Exam  Constitutional: She is oriented to person, place, and time. She appears well-developed and well-nourished.  HENT:  Head: Normocephalic and atraumatic.  Eyes: EOM are normal. Pupils are equal, round, and reactive to light.  Neck: Normal range of motion. Neck supple.  Cardiovascular: Normal rate and regular rhythm.   Pulmonary/Chest: Effort normal and breath sounds normal.  Abdominal:    Musculoskeletal: Normal range of motion.  Neurological: She is alert and oriented to person, place, and time.  Skin: Skin is warm and dry.  Psychiatric: She has a normal mood and affect. Her behavior is normal. Judgment and thought content normal.    Data Reviewed   Assessment    Ventral hernia and diastasis recti    Plan    Will need complex repair of hernia with plastic surgery.  She is seeing Dr Kelly Splinter today to discuss further but her condition has worsened since I last saw her.  She now has a hernia and more pain.The risk of hernia repair include bleeding,  Infection,   Recurrence of the hernia,  Mesh use, chronic pain,  Organ injury,  Bowel injury,  Bladder injury,   nerve injury with numbness  around the incision,  Death,  and worsening of preexisting  medical problems.  The alternatives to surgery have been discussed as well..  Long term expectations of both operative and non operative treatments have been discussed.   The patient agrees to proceed.       Manya Balash A. 06/15/2011, 10:16 AM

## 2011-06-15 NOTE — Patient Instructions (Signed)
Hernia  A hernia occurs when an internal organ pushes out through a weak spot in the abdominal wall. Hernias most commonly occur in the groin and around the navel. Hernias often can be pushed back into place (reduced). Most hernias tend to get worse over time. Some abdominal hernias can get stuck in the opening (irreducible or incarcerated hernia) and cannot be reduced. An irreducible abdominal hernia which is tightly squeezed into the opening is at risk for impaired blood supply (strangulated hernia). A strangulated hernia is a medical emergency. Because of the risk for an irreducible or strangulated hernia, surgery may be recommended to repair a hernia.  CAUSES    Heavy lifting.   Prolonged coughing.   Straining to have a bowel movement.   A cut (incision) made during an abdominal surgery.  HOME CARE INSTRUCTIONS    Bed rest is not required. You may continue your normal activities.   Avoid lifting more than 10 pounds (4.5 kg) or straining.   Cough gently. If you are a smoker it is best to stop. Even the best hernia repair can break down with the continual strain of coughing. Even if you do not have your hernia repaired, a cough will continue to aggravate the problem.   Do not wear anything tight over your hernia. Do not try to keep it in with an outside bandage or truss. These can damage abdominal contents if they are trapped within the hernia sac.   Eat a normal diet.   Avoid constipation. Straining over long periods of time will increase hernia size and encourage breakdown of repairs. If you cannot do this with diet alone, stool softeners may be used.  SEEK IMMEDIATE MEDICAL CARE IF:    You have a fever.   You develop increasing abdominal pain.   You feel nauseous or vomit.   Your hernia is stuck outside the abdomen, looks discolored, feels hard, or is tender.   You have any changes in your bowel habits or in the hernia that are unusual for you.   You have increased pain or swelling around the  hernia.   You cannot push the hernia back in place by applying gentle pressure while lying down.  MAKE SURE YOU:    Understand these instructions.   Will watch your condition.   Will get help right away if you are not doing well or get worse.  Document Released: 02/01/2005 Document Revised: 01/21/2011 Document Reviewed: 09/21/2007  [employer] Patient Information 2012 [employer].

## 2011-06-22 DIAGNOSIS — M6208 Separation of muscle (nontraumatic), other site: Secondary | ICD-10-CM | POA: Insufficient documentation

## 2011-07-22 ENCOUNTER — Telehealth (INDEPENDENT_AMBULATORY_CARE_PROVIDER_SITE_OTHER): Payer: Self-pay | Admitting: General Surgery

## 2011-07-22 NOTE — Telephone Encounter (Signed)
Pt asking general question about recovery and restrictions after surgery.  Her sister is to be married about 10 days after surgery and she is considering rescheduling the procedure.  Answered her questions and she will discuss with her family.

## 2011-08-08 IMAGING — US US PELVIS COMPLETE
1 series · 13 of 25 positions shown · non-contrast
Comparison: CT abdomen pelvis 11/11/2009.

CLINICAL DATA: 24-year-old G3 P2, LMP 02/06/2010, presenting with
pelvic pain and dysfunctional uterine bleeding.

TRANSABDOMINAL ULTRASOUND OF PELVIS 03/01/2010:
TECHNIQUE: Transabdominal ultrasound examination of the pelvis was
performed including evaluation of the uterus, ovaries, adnexal
regions, and pelvic cul-de-sac.

[Series 1: us pelvis complete · 13 of 75 slices shown]
[im 1/75]
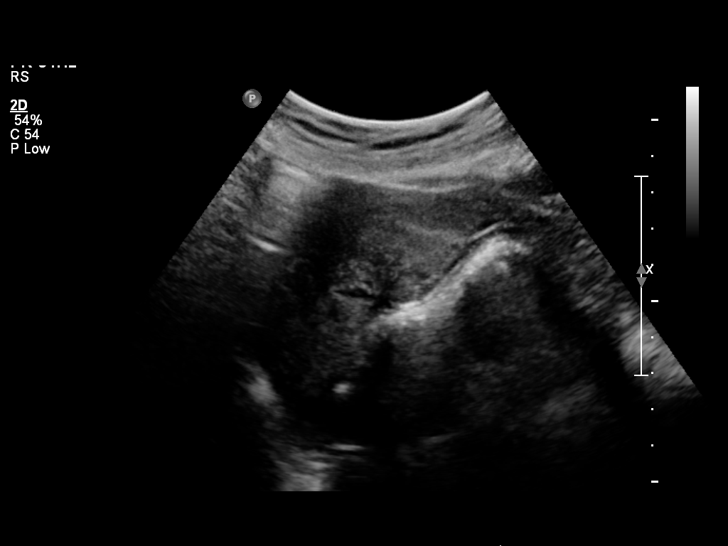
[im 7/75]
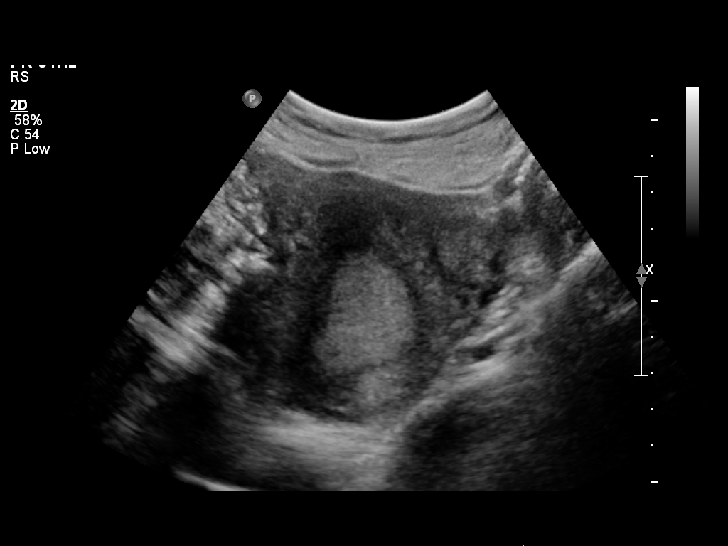
[im 13/75]
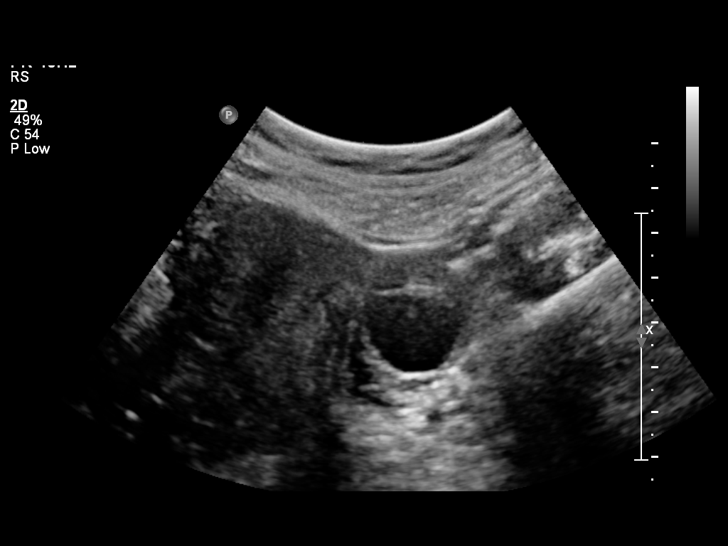
[im 19/75]
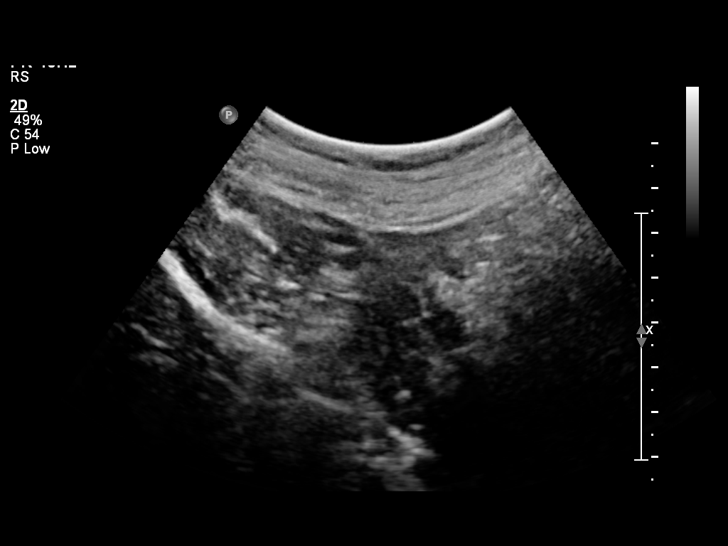
[im 25/75]
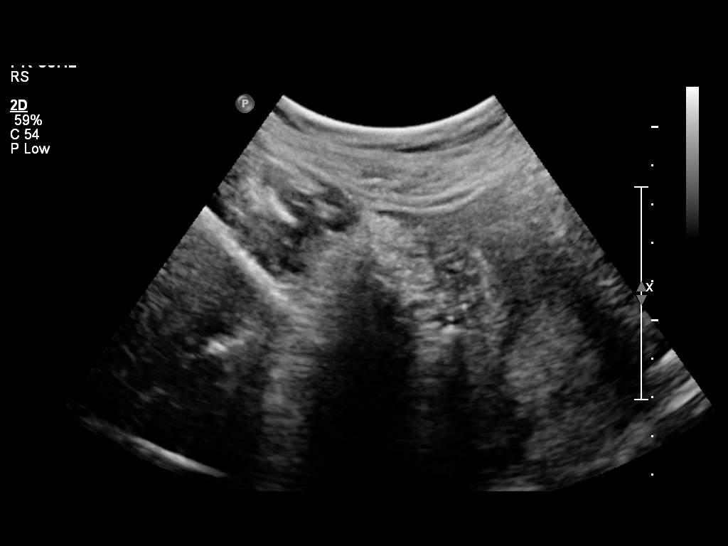
[im 31/75]
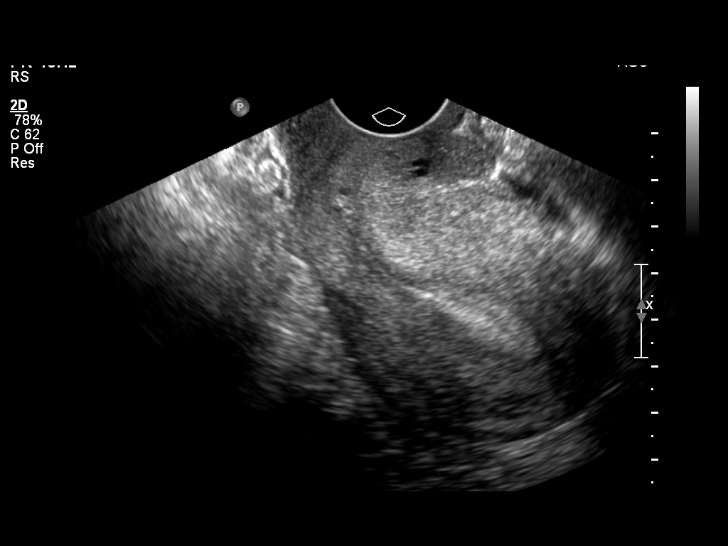
[im 38/75]
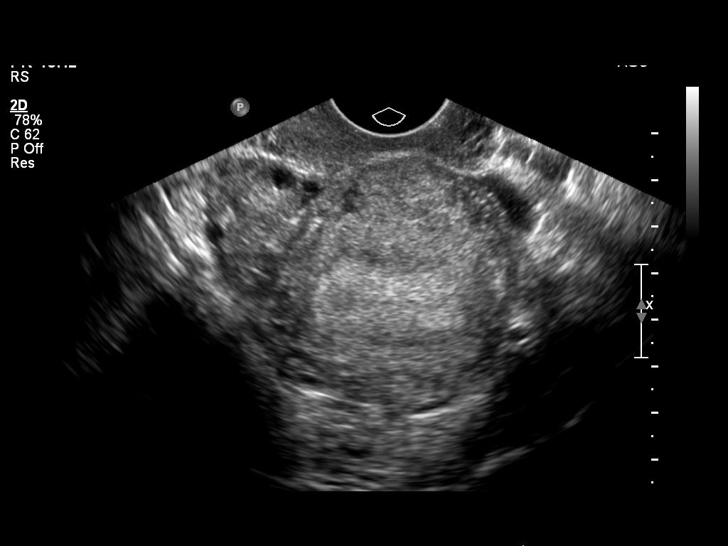
[im 44/75]
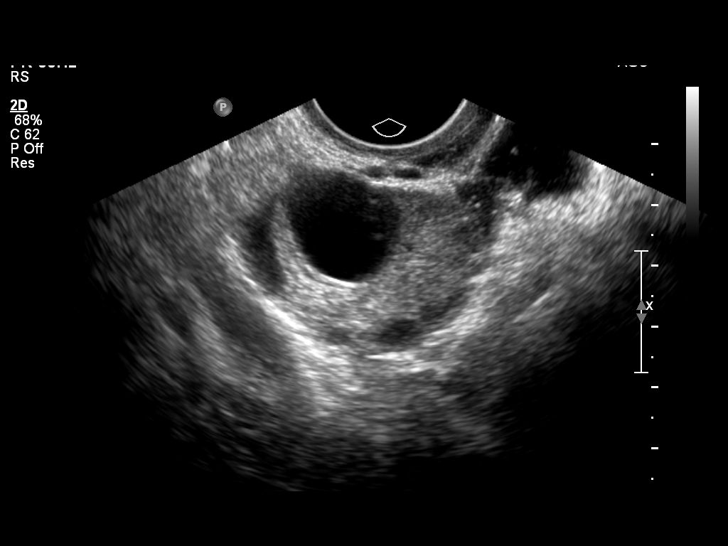
[im 50/75]
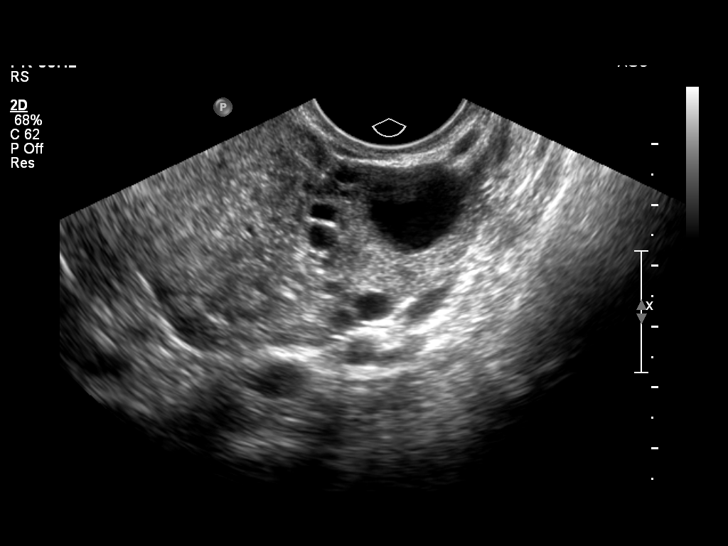
[im 56/75]
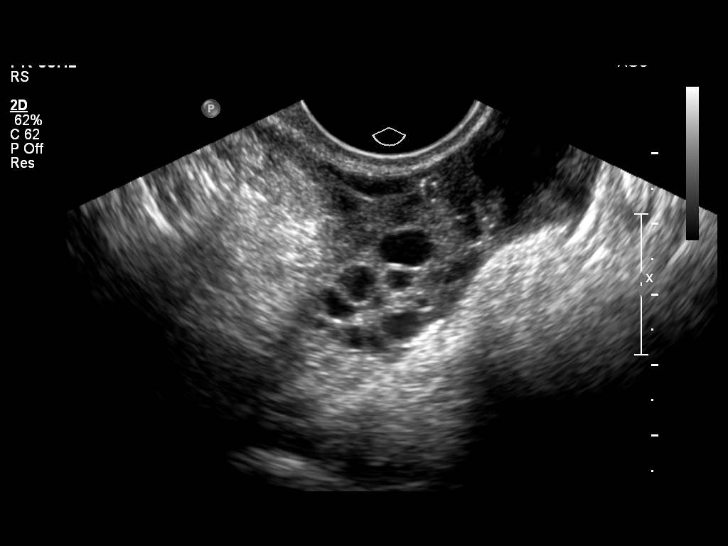
[im 62/75]
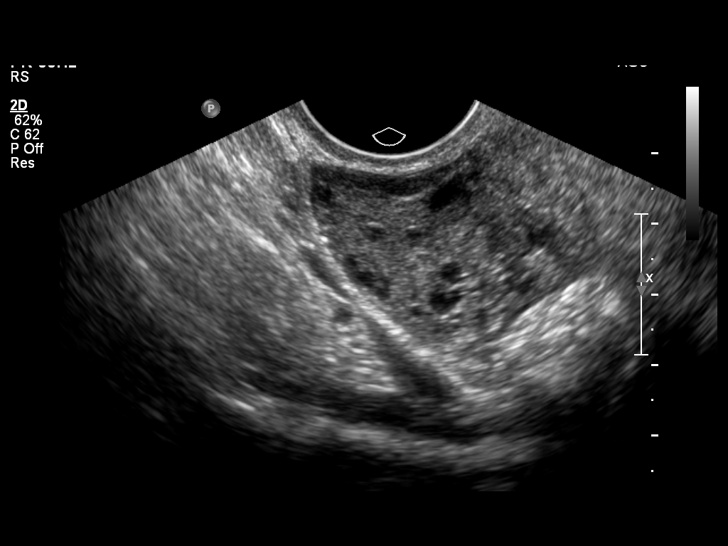
[im 68/75]
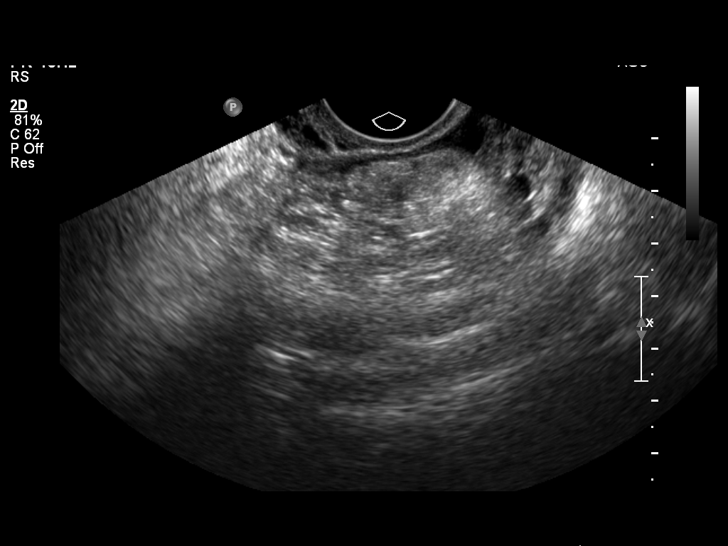
[im 75/75]
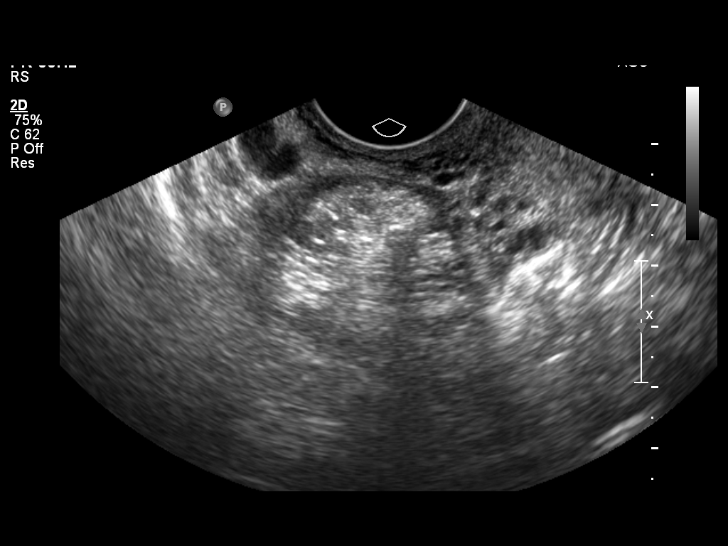

[13 of 25 positions shown; findings below may reference images not displayed]

FINDINGS: Uterus retroverted and normal in size measuring approximately 8.8 x
5.4 x 6.3 cm.  No focal myometrial abnormality.

Endometrium normal and trilaminar in appearance with no evidence of
endometrial fluid or mass.  Approximately 10 mm in thickness.

Right Ovary contains a large dermoid as noted on the prior CT,
measuring approximately 6.0 x 4.7 x 4.2 cm.  Small follicular cysts
on the remainder of the ovary which is normal in size and
appearance.

Left Ovary normal in size appearance, containing several follicles,
the largest of which measures approximately 2.0 cm.  No dominant
cyst or solid mass.

Other Findings:  No adnexal masses.  Minimal likely physiologic
free fluid in the cul-de-sac.
IMPRESSION: 1.  Right ovarian dermoid as identified on prior examinations.
2.  Otherwise normal pelvic ultrasound.

## 2011-09-09 ENCOUNTER — Inpatient Hospital Stay (HOSPITAL_COMMUNITY): Admission: RE | Admit: 2011-09-09 | Discharge: 2011-09-09 | Source: Ambulatory Visit

## 2011-09-09 NOTE — Pre-Procedure Instructions (Signed)
20 Toni Warren  09/09/2011   Your procedure is scheduled on:  August 14th  Report to [facility] at 0630 AM.  Call this number if you have problems the morning of surgery: [phone]   Remember:   Do not eat food or drink:After Midnight.  Take these medicines the morning of surgery with A SIP OF WATER: Celexa   Do not wear jewelry, make-up or nail polish.  Do not wear lotions, powders, or perfumes.   Do not shave 48 hours prior to surgery. Men may shave face and neck.  Do not bring valuables to the hospital.  Contacts, dentures or bridgework may not be worn into surgery.  Leave suitcase in the car. After surgery it may be brought to your room.  For patients admitted to the hospital, checkout time is 11:00 AM the day of discharge.   Patients discharged the day of surgery will not be allowed to drive home.  Special Instructions: CHG Shower Use Special Wash: 1/2 bottle night before surgery and 1/2 bottle morning of surgery.   Please read over the following fact sheets that you were given: Pain Booklet, Coughing and Deep Breathing, MRSA Information and Surgical Site Infection Prevention

## 2011-09-13 ENCOUNTER — Encounter (HOSPITAL_COMMUNITY): Payer: Self-pay | Admitting: Pharmacy Technician

## 2011-09-20 ENCOUNTER — Telehealth (INDEPENDENT_AMBULATORY_CARE_PROVIDER_SITE_OTHER): Payer: Self-pay

## 2011-09-20 ENCOUNTER — Encounter (HOSPITAL_COMMUNITY)
Admission: RE | Admit: 2011-09-20 | Discharge: 2011-09-20 | Disposition: A | Discharge status: Home / Self Care | Source: Ambulatory Visit | Attending: Surgery | Admitting: Surgery

## 2011-09-20 ENCOUNTER — Encounter (HOSPITAL_COMMUNITY): Payer: Self-pay

## 2011-09-20 HISTORY — DX: Depression, unspecified: F32.A

## 2011-09-20 HISTORY — DX: Essential (primary) hypertension: I10

## 2011-09-20 HISTORY — DX: Sickle-cell trait: D57.3

## 2011-09-20 HISTORY — DX: Personal history of other diseases of the respiratory system: Z87.09

## 2011-09-20 HISTORY — DX: Major depressive disorder, single episode, unspecified: F32.9

## 2011-09-20 HISTORY — DX: Anxiety disorder, unspecified: F41.9

## 2011-09-20 HISTORY — DX: Personal history of other diseases of the nervous system and sense organs: Z86.69

## 2011-09-20 LAB — SURGICAL PCR SCREEN
MRSA, PCR: NEGATIVE
Staphylococcus aureus: NEGATIVE

## 2011-09-20 LAB — CBC
HCT: 40.9 % (ref 36.0–46.0)
Hemoglobin: 14.1 g/dL (ref 12.0–15.0)
MCH: 29 pg (ref 26.0–34.0)
MCHC: 34.5 g/dL (ref 30.0–36.0)
MCV: 84 fL (ref 78.0–100.0)
Platelets: 369 10*3/uL (ref 150–400)
RBC: 4.87 MIL/uL (ref 3.87–5.11)
RDW: 13 % (ref 11.5–15.5)
WBC: 6.6 10*3/uL (ref 4.0–10.5)

## 2011-09-20 LAB — HCG, SERUM, QUALITATIVE: Preg, Serum: NEGATIVE

## 2011-09-20 NOTE — Progress Notes (Signed)
Pt doesn't have  A cardiologist  Denies ever having a stress test/echo/heart cath  Pt doesn't have a medical MD  Denies recent ekg or cxr

## 2011-09-20 NOTE — Telephone Encounter (Signed)
Pt needs pre-op appointment.

## 2011-09-20 NOTE — Telephone Encounter (Signed)
[facility] from short stay called regarding surgery orders of this patient. They need a consent and any other orders/labs patient needs for surgery.

## 2011-09-20 NOTE — Pre-Procedure Instructions (Signed)
20 NELANI SCHMELZLE  09/20/2011   Your procedure is scheduled on:  Wed, Aug 14 @ 8:30 AM  Report to [facility] at 6:30 AM.  Call this number if you have problems the morning of surgery: [phone]   Remember:   Do not eat food:After Midnight.               Do not wear jewelry, make-up or nail polish.  Do not wear lotions, powders, or perfumes.   Do not shave 48 hours prior to surgery.   Do not bring valuables to the hospital.  Contacts, dentures or bridgework may not be worn into surgery.  Leave suitcase in the car. After surgery it may be brought to your room.  For patients admitted to the hospital, checkout time is 11:00 AM the day of discharge.   Patients discharged the day of surgery will not be allowed to drive home.  Special Instructions: CHG Shower Use Special Wash: 1/2 bottle night before surgery and 1/2 bottle morning of surgery.   Please read over the following fact sheets that you were given: Pain Booklet, Coughing and Deep Breathing, MRSA Information and Surgical Site Infection Prevention

## 2011-09-22 ENCOUNTER — Telehealth (INDEPENDENT_AMBULATORY_CARE_PROVIDER_SITE_OTHER): Payer: Self-pay | Admitting: General Surgery

## 2011-09-22 NOTE — Progress Notes (Signed)
Spoke with nurse at [facility] requesting orders.

## 2011-09-22 NOTE — Telephone Encounter (Signed)
[place] from short stay was calling to get orders put in by Dr. Luisa Hart on this pt whose surgery is 8/14.  Informed them that I would relay the message to him.

## 2011-09-23 ENCOUNTER — Ambulatory Visit (INDEPENDENT_AMBULATORY_CARE_PROVIDER_SITE_OTHER): Admitting: Surgery

## 2011-09-23 ENCOUNTER — Encounter (INDEPENDENT_AMBULATORY_CARE_PROVIDER_SITE_OTHER): Payer: Self-pay | Admitting: Surgery

## 2011-09-23 VITALS — BP 126/68 | HR 96 | Temp 98.1°F | Resp 16 | Ht 62.0 in | Wt 150.2 lb

## 2011-09-23 DIAGNOSIS — K439 Ventral hernia without obstruction or gangrene: Secondary | ICD-10-CM

## 2011-09-23 NOTE — Progress Notes (Signed)
Patient ID: Toni Warren, female   DOB: Apr 22, 1985, 26 y.o.   MRN: [id number]  Chief Complaint  Patient presents with  . Pre-op Exam    vent hernia    HPI Toni Warren is a 26 y.o. female.   HPI The patient returns to clinic for follow up of her abdominal wall diastases. She is having no abdominal distention and abdominal discomfort now. She denies any nausea or vomiting. The pain is gone nature made worse when she stands and putspressure her abdominal wall.  Past Medical History  Diagnosis Date  . Anemia   . Hyperlipidemia     postpartum 2+yrs ago  . Hypertension     postpartum 2+yrs ago  . History of bronchitis 3+yrs ago  . History of migraine 09/20/11-last one  . GERD (gastroesophageal reflux disease)     only takes OTC meds  . Sickle cell trait   . Anxiety   . Depression     but doesn't require meds    Past Surgical History  Procedure Date  . Ovarian cyst removal 2012  . Eye surgery   . Eye surgery 1990's    History reviewed. No pertinent family history.  Social History History  Substance Use Topics  . Smoking status: Passive Smoker  . Smokeless tobacco: Not on file  . Alcohol Use: No    Allergies  Allergen Reactions  . Percocet (Oxycodone-Acetaminophen) Itching    Current Outpatient Prescriptions  Medication Sig Dispense Refill  . acetaminophen (TYLENOL) 500 MG tablet Take 1,000 mg by mouth every 6 (six) hours as needed. For pain      . aspirin-acetaminophen-caffeine (EXCEDRIN MIGRAINE) 250-250-65 MG per tablet Take 1 tablet by mouth daily as needed. For migraines        Review of Systems Review of Systems  Constitutional: Negative for fever, chills and unexpected weight change.  HENT: Negative for hearing loss, congestion, sore throat, trouble swallowing and voice change.   Eyes: Negative for visual disturbance.  Respiratory: Negative for cough and wheezing.   Cardiovascular: Negative for chest pain, palpitations and leg swelling.    Gastrointestinal: Positive for abdominal pain and abdominal distention. Negative for nausea, vomiting, diarrhea, constipation, blood in stool and anal bleeding.  Genitourinary: Negative for hematuria, vaginal bleeding and difficulty urinating.  Musculoskeletal: Negative for arthralgias.  Skin: Negative for rash and wound.  Neurological: Negative for seizures, syncope and headaches.  Hematological: Negative for adenopathy. Does not bruise/bleed easily.  Psychiatric/Behavioral: Negative for confusion.    Blood pressure 126/68, pulse 96, temperature 98.1 F (36.7 C), temperature source Temporal, resp. rate 16, height 5\' 2"  (1.575 m), weight 150 lb 3.2 oz (68.13 kg), last menstrual period 09/07/2011.  Physical Exam Physical Exam  Constitutional: She is oriented to person, place, and time. She appears well-developed and well-nourished.  HENT:  Head: Normocephalic and atraumatic.  Eyes: EOM are normal. Pupils are equal, round, and reactive to light.  Neck: Normal range of motion. Neck supple.  Cardiovascular: Normal rate and regular rhythm.   Pulmonary/Chest: Effort normal and breath sounds normal.  Abdominal: large midline diastases with periumbilical ventral hernia component measuring 4 x 6 cm reducible.   Musculoskeletal: Normal range of motion.  Neurological: She is alert and oriented to person, place, and time.  Skin: Skin is warm and dry.  Psychiatric: She has a normal mood and affect. Her behavior is normal. Judgment and thought content normal.       Assessment    Ventral hernia and diastasis recti  Plan    Will need complex repair of hernia with plastic surgery.  She is seeing Dr Kelly Splinter today to discuss further but her condition has worsened since I last saw her.  She now has a hernia and more pain.The risk of hernia repair include bleeding,  Infection,   Recurrence of the hernia,  Mesh use, chronic pain,  Organ injury,  Bowel injury,  Bladder injury,   nerve injury with  numbness around the incision,  Death,  and worsening of preexisting  medical problems.  The alternatives to surgery have been discussed as well..  Long term expectations of both operative and non operative treatments have been discussed.   The patient agrees to proceed.       CORNETT,THOMAS A. 09/23/2011, 11:03 AM

## 2011-09-23 NOTE — Patient Instructions (Signed)
Hernia Repair Care After These instructions give you information on caring for yourself after your procedure. Your doctor may also give you more specific instructions. Call your doctor if you have any problems or questions after your procedure. HOME CARE   You may have changes in your poops (bowel movements).   You may have loose or watery poop (diarrhea).   You may be not able to poop.   Your bowels will slowly get back to normal.   Do not eat any food that makes you sick to your stomach (nauseous). Eat small meals 4 to 6 times a day instead of 3 large ones.   Do not drink pop. It will give you gas.   Do not drink alcohol.   Do not lift anything heavier than 10 pounds. This is about the weight of a gallon of milk.   Do not do anything that makes you very tired for at least 6 weeks.   Do not get your wound wet for 2 days.   You may take a sponge bath during this time.   After 2 days you may take a shower. Gently pat your surgical cut (incision) dry with a towel. Do not rub it.   For men: You may have been given an athletic supporter (scrotal support) before you left the hospital. It holds your scrotum and testicles closer to your body so there is no strain on your wound. Wear the supporter until your doctor tells you that you do not need it anymore.  GET HELP RIGHT AWAY IF:  You have watery poop, or cannot poop for more than 3 days.   You feel sick to your stomach or throw up (vomit) more than 2 or 3 times.   You have temperature by mouth above 102 F (38.9 C).   You see redness or puffiness (swelling) around your wound.   You see yellowish white fluid (pus) coming from your wound.   You see a bulge or bump in your lower belly (abdomen) or near your groin.   You develop a rash, trouble breathing, or any other symptoms from medicines taken.  MAKE SURE YOU:  Understand these instructions.   Will watch your condition.   Will get help right away if your are not doing  well or get worse.  Document Released: 01/15/2008 Document Revised: 01/21/2011 Document Reviewed: 01/15/2008 Exit[employer] Patient Information 2012 [employer]. 

## 2011-09-24 ENCOUNTER — Other Ambulatory Visit: Payer: Self-pay | Admitting: Plastic Surgery

## 2011-09-28 MED ORDER — DEXTROSE 5 % IV SOLN
3.0000 g | INTRAVENOUS | Status: AC
  Administered 2011-09-29: 3 g via INTRAVENOUS
  Filled 2011-09-28 (×2): qty 3000

## 2011-09-29 ENCOUNTER — Ambulatory Visit (HOSPITAL_COMMUNITY)

## 2011-09-29 ENCOUNTER — Ambulatory Visit (HOSPITAL_COMMUNITY)
Admission: RE | Admit: 2011-09-29 | Discharge: 2011-09-30 | Disposition: A | Discharge status: Home / Self Care | Source: Ambulatory Visit | Attending: Plastic Surgery | Admitting: Plastic Surgery

## 2011-09-29 ENCOUNTER — Encounter (HOSPITAL_COMMUNITY): Payer: Self-pay | Admitting: Anesthesiology

## 2011-09-29 ENCOUNTER — Ambulatory Visit (HOSPITAL_COMMUNITY): Admitting: Anesthesiology

## 2011-09-29 ENCOUNTER — Encounter (HOSPITAL_COMMUNITY): Payer: Self-pay

## 2011-09-29 ENCOUNTER — Encounter (HOSPITAL_COMMUNITY)
Admission: RE | Disposition: A | Discharge status: Home / Self Care | Payer: Self-pay | Source: Ambulatory Visit | Attending: Surgery

## 2011-09-29 DIAGNOSIS — D573 Sickle-cell trait: Secondary | ICD-10-CM | POA: Insufficient documentation

## 2011-09-29 DIAGNOSIS — K439 Ventral hernia without obstruction or gangrene: Secondary | ICD-10-CM | POA: Insufficient documentation

## 2011-09-29 DIAGNOSIS — L909 Atrophic disorder of skin, unspecified: Secondary | ICD-10-CM | POA: Insufficient documentation

## 2011-09-29 DIAGNOSIS — K219 Gastro-esophageal reflux disease without esophagitis: Secondary | ICD-10-CM | POA: Insufficient documentation

## 2011-09-29 DIAGNOSIS — M6200 Separation of muscle (nontraumatic), unspecified site: Secondary | ICD-10-CM | POA: Insufficient documentation

## 2011-09-29 DIAGNOSIS — Z01812 Encounter for preprocedural laboratory examination: Secondary | ICD-10-CM | POA: Insufficient documentation

## 2011-09-29 DIAGNOSIS — K469 Unspecified abdominal hernia without obstruction or gangrene: Secondary | ICD-10-CM | POA: Diagnosis present

## 2011-09-29 HISTORY — PX: ABDOMINOPLASTY: SHX5355

## 2011-09-29 HISTORY — PX: VENTRAL HERNIA REPAIR: SHX424

## 2011-09-29 LAB — COMPREHENSIVE METABOLIC PANEL
ALT: 28 U/L (ref 0–35)
AST: 23 U/L (ref 0–37)
Albumin: 3.9 g/dL (ref 3.5–5.2)
Alkaline Phosphatase: 69 U/L (ref 39–117)
BUN: 13 mg/dL (ref 6–23)
CO2: 26 mEq/L (ref 19–32)
Calcium: 9.3 mg/dL (ref 8.4–10.5)
Chloride: 105 mEq/L (ref 96–112)
Creatinine, Ser: 0.75 mg/dL (ref 0.50–1.10)
GFR calc Af Amer: >90 mL/min (ref >90)
GFR calc non Af Amer: >90 mL/min (ref >90)
Glucose, Bld: 101 mg/dL — ABNORMAL HIGH (ref 70–99)
Potassium: 3.9 mEq/L (ref 3.5–5.1)
Sodium: 141 mEq/L (ref 135–145)
Total Bilirubin: 0.4 mg/dL (ref 0.3–1.2)
Total Protein: 7.1 g/dL (ref 6.0–8.3)

## 2011-09-29 SURGERY — REPAIR, HERNIA, VENTRAL
Anesthesia: General | Site: Abdomen | Wound class: Clean

## 2011-09-29 MED ORDER — DEXAMETHASONE SODIUM PHOSPHATE 4 MG/ML IJ SOLN
INTRAMUSCULAR | Status: DC | PRN
  Administered 2011-09-29: 4 mg via INTRAVENOUS

## 2011-09-29 MED ORDER — POTASSIUM CHLORIDE IN NACL 20-0.45 MEQ/L-% IV SOLN
INTRAVENOUS | Status: DC
  Administered 2011-09-30: 02:00:00 via INTRAVENOUS
  Filled 2011-09-29 (×3): qty 1000

## 2011-09-29 MED ORDER — LACTATED RINGERS IV SOLN
INTRAVENOUS | Status: DC | PRN
  Administered 2011-09-29 (×3): via INTRAVENOUS

## 2011-09-29 MED ORDER — ONDANSETRON HCL 4 MG/2ML IJ SOLN
4.0000 mg | Freq: Four times a day (QID) | INTRAMUSCULAR | Status: DC | PRN
  Administered 2011-09-29: 4 mg via INTRAVENOUS
  Filled 2011-09-29: qty 2

## 2011-09-29 MED ORDER — MIDAZOLAM HCL 5 MG/5ML IJ SOLN
INTRAMUSCULAR | Status: DC | PRN
  Administered 2011-09-29: 2 mg via INTRAVENOUS

## 2011-09-29 MED ORDER — HYDROMORPHONE HCL PF 1 MG/ML IJ SOLN
0.2500 mg | INTRAMUSCULAR | Status: DC | PRN
  Administered 2011-09-29 (×4): 0.5 mg via INTRAVENOUS

## 2011-09-29 MED ORDER — HYDROMORPHONE HCL PF 1 MG/ML IJ SOLN
INTRAMUSCULAR | Status: AC
  Filled 2011-09-29: qty 1

## 2011-09-29 MED ORDER — MORPHINE SULFATE 2 MG/ML IJ SOLN
1.0000 mg | INTRAMUSCULAR | Status: DC | PRN
  Administered 2011-09-29 – 2011-09-30 (×2): 1 mg via INTRAVENOUS
  Filled 2011-09-29 (×2): qty 1

## 2011-09-29 MED ORDER — NEOSTIGMINE METHYLSULFATE 1 MG/ML IJ SOLN
INTRAMUSCULAR | Status: DC | PRN
  Administered 2011-09-29: 4 mg via INTRAVENOUS

## 2011-09-29 MED ORDER — 0.9 % SODIUM CHLORIDE (POUR BTL) OPTIME
TOPICAL | Status: DC | PRN
  Administered 2011-09-29 (×2): 1000 mL

## 2011-09-29 MED ORDER — LIDOCAINE HCL (CARDIAC) 20 MG/ML IV SOLN
INTRAVENOUS | Status: DC | PRN
  Administered 2011-09-29: 100 mg via INTRAVENOUS

## 2011-09-29 MED ORDER — GLYCOPYRROLATE 0.2 MG/ML IJ SOLN
INTRAMUSCULAR | Status: DC | PRN
  Administered 2011-09-29: .5 mg via INTRAVENOUS

## 2011-09-29 MED ORDER — HYDROCODONE-ACETAMINOPHEN 5-325 MG PO TABS
1.0000 | ORAL_TABLET | Freq: Four times a day (QID) | ORAL | Status: DC | PRN
  Administered 2011-09-29 – 2011-09-30 (×4): 1 via ORAL
  Filled 2011-09-29 (×3): qty 1

## 2011-09-29 MED ORDER — PNEUMOCOCCAL VAC POLYVALENT 25 MCG/0.5ML IJ INJ
0.5000 mL | INJECTION | INTRAMUSCULAR | Status: DC
  Filled 2011-09-29: qty 0.5

## 2011-09-29 MED ORDER — SENNOSIDES-DOCUSATE SODIUM 8.6-50 MG PO TABS: 1.0000 | ORAL_TABLET | Freq: Every evening | ORAL | Status: DC | PRN

## 2011-09-29 MED ORDER — ONDANSETRON HCL 4 MG/2ML IJ SOLN
4.0000 mg | Freq: Once | INTRAMUSCULAR | Status: DC | PRN
Start: 2011-09-29 — End: 2011-09-29

## 2011-09-29 MED ORDER — BISACODYL 5 MG PO TBEC: 5.0000 mg | DELAYED_RELEASE_TABLET | Freq: Every day | ORAL | Status: DC | PRN

## 2011-09-29 MED ORDER — FENTANYL CITRATE 0.05 MG/ML IJ SOLN
INTRAMUSCULAR | Status: DC | PRN
  Administered 2011-09-29 (×2): 150 ug via INTRAVENOUS
  Administered 2011-09-29: 50 ug via INTRAVENOUS
  Administered 2011-09-29: 100 ug via INTRAVENOUS
  Administered 2011-09-29: 50 ug via INTRAVENOUS

## 2011-09-29 MED ORDER — SODIUM CHLORIDE 0.9 % IV SOLN
3.0000 g | Freq: Four times a day (QID) | INTRAVENOUS | Status: DC
  Administered 2011-09-30 (×2): 3 g via INTRAVENOUS
  Filled 2011-09-29 (×5): qty 3

## 2011-09-29 MED ORDER — SODIUM CHLORIDE 0.9 % IR SOLN
Status: DC | PRN
  Administered 2011-09-29: 11:00:00

## 2011-09-29 MED ORDER — ONDANSETRON HCL 4 MG/2ML IJ SOLN
INTRAMUSCULAR | Status: DC | PRN
  Administered 2011-09-29: 4 mg via INTRAVENOUS

## 2011-09-29 MED ORDER — DOCUSATE SODIUM 100 MG PO CAPS
100.0000 mg | ORAL_CAPSULE | Freq: Three times a day (TID) | ORAL | Status: DC
  Administered 2011-09-29: 100 mg via ORAL
  Filled 2011-09-29: qty 1

## 2011-09-29 MED ORDER — PANTOPRAZOLE SODIUM 40 MG IV SOLR
40.0000 mg | Freq: Every day | INTRAVENOUS | Status: DC
  Administered 2011-09-29: 40 mg via INTRAVENOUS
  Filled 2011-09-29 (×2): qty 40

## 2011-09-29 MED ORDER — VECURONIUM BROMIDE 10 MG IV SOLR
INTRAVENOUS | Status: DC | PRN
  Administered 2011-09-29: 7 mg via INTRAVENOUS
  Administered 2011-09-29: 2 mg via INTRAVENOUS
  Administered 2011-09-29: 1 mg via INTRAVENOUS
  Administered 2011-09-29: 2 mg via INTRAVENOUS

## 2011-09-29 MED ORDER — HYDROCODONE-ACETAMINOPHEN 5-325 MG PO TABS
ORAL_TABLET | ORAL | Status: AC
  Filled 2011-09-29: qty 1

## 2011-09-29 MED ORDER — PROPOFOL 10 MG/ML IV BOLUS
INTRAVENOUS | Status: DC | PRN
  Administered 2011-09-29: 150 mg via INTRAVENOUS

## 2011-09-29 SURGICAL SUPPLY — 86 items
ADH SKN CLS APL DERMABOND .7 (GAUZE/BANDAGES/DRESSINGS) ×1
APPLIER CLIP 9.375 MED OPEN (MISCELLANEOUS)
APR CLP MED 9.3 20 MLT OPN (MISCELLANEOUS)
BAG DECANTER FOR FLEXI CONT (MISCELLANEOUS) ×2 IMPLANT
BINDER BREAST MEDIUM (GAUZE/BANDAGES/DRESSINGS) ×2 IMPLANT
BLADE SURG ROTATE 9660 (MISCELLANEOUS) ×2 IMPLANT
CANISTER SUCTION 2500CC (MISCELLANEOUS) ×2 IMPLANT
CHLORAPREP W/TINT 26ML (MISCELLANEOUS) ×2 IMPLANT
CLIP APPLIE 9.375 MED OPEN (MISCELLANEOUS) IMPLANT
CLOTH BEACON ORANGE TIMEOUT ST (SAFETY) ×2 IMPLANT
CORDS BIPOLAR (ELECTRODE) IMPLANT
COVER SURGICAL LIGHT HANDLE (MISCELLANEOUS) ×4 IMPLANT
DERMABOND ADVANCED (GAUZE/BANDAGES/DRESSINGS) ×1
DERMABOND ADVANCED .7 DNX12 (GAUZE/BANDAGES/DRESSINGS) ×1 IMPLANT
DRAIN CHANNEL 19F RND (DRAIN) ×4 IMPLANT
DRAPE LAPAROSCOPIC ABDOMINAL (DRAPES) IMPLANT
DRAPE ORTHO SPLIT 77X108 STRL (DRAPES) ×4
DRAPE PROXIMA HALF (DRAPES) IMPLANT
DRAPE SURG 17X11 SM STRL (DRAPES) ×4 IMPLANT
DRAPE SURG ORHT 6 SPLT 77X108 (DRAPES) ×2 IMPLANT
DRAPE UTILITY 15X26 W/TAPE STR (DRAPE) ×4 IMPLANT
DRAPE WARM FLUID 44X44 (DRAPE) ×2 IMPLANT
DRSG PAD ABDOMINAL 8X10 ST (GAUZE/BANDAGES/DRESSINGS) ×8 IMPLANT
ELECT BLADE 6.5 EXT (BLADE) IMPLANT
ELECT CAUTERY BLADE 6.4 (BLADE) ×2 IMPLANT
ELECT REM PT RETURN 9FT ADLT (ELECTROSURGICAL) ×2
ELECTRODE REM PT RTRN 9FT ADLT (ELECTROSURGICAL) ×1 IMPLANT
EVACUATOR SILICONE 100CC (DRAIN) ×4 IMPLANT
GLOVE BIO SURGEON STRL SZ 6.5 (GLOVE) ×4 IMPLANT
GLOVE BIO SURGEON STRL SZ7 (GLOVE) ×2 IMPLANT
GLOVE BIO SURGEON STRL SZ7.5 (GLOVE) ×2 IMPLANT
GLOVE BIO SURGEON STRL SZ8 (GLOVE) ×2 IMPLANT
GLOVE BIOGEL PI IND STRL 7.0 (GLOVE) ×1 IMPLANT
GLOVE BIOGEL PI IND STRL 8 (GLOVE) ×1 IMPLANT
GLOVE BIOGEL PI INDICATOR 7.0 (GLOVE) ×1
GLOVE BIOGEL PI INDICATOR 8 (GLOVE) ×1
GLOVE SS BIOGEL STRL SZ 6.5 (GLOVE) ×1 IMPLANT
GLOVE SUPERSENSE BIOGEL SZ 6.5 (GLOVE) ×1
GLOVE SURG ORTHO 7.0 STRL STRW (GLOVE) ×2 IMPLANT
GLOVE SURG SS PI 6.5 STRL IVOR (GLOVE) ×4 IMPLANT
GOWN STRL NON-REIN LRG LVL3 (GOWN DISPOSABLE) ×10 IMPLANT
KIT BASIN OR (CUSTOM PROCEDURE TRAY) ×4 IMPLANT
KIT ROOM TURNOVER OR (KITS) ×2 IMPLANT
MARKER SKIN DUAL TIP RULER LAB (MISCELLANEOUS) ×2 IMPLANT
MICROMATRIX 500MG (Tissue) ×2 IMPLANT
NS IRRIG 1000ML POUR BTL (IV SOLUTION) ×4 IMPLANT
PACK GENERAL/GYN (CUSTOM PROCEDURE TRAY) ×2 IMPLANT
PAD ARMBOARD 7.5X6 YLW CONV (MISCELLANEOUS) ×2 IMPLANT
PEN SKIN MARKING BROAD (MISCELLANEOUS) IMPLANT
PIN SAFETY STERILE (MISCELLANEOUS) ×2 IMPLANT
SOLUTION PARTIC MCRMTRX 500MG (Tissue) ×1 IMPLANT
SPONGE GAUZE 4X4 12PLY (GAUZE/BANDAGES/DRESSINGS) IMPLANT
SPONGE LAP 18X18 X RAY DECT (DISPOSABLE) ×10 IMPLANT
STAPLER VISISTAT 35W (STAPLE) IMPLANT
STRIP CLOSURE SKIN 1/2X4 (GAUZE/BANDAGES/DRESSINGS) IMPLANT
SUT ETHIBOND 2 0 SH (SUTURE) IMPLANT
SUT ETHIBOND NAB CT1 #1 30IN (SUTURE) ×10 IMPLANT
SUT ETHIBOND NAB CTX #1 30IN (SUTURE) IMPLANT
SUT ETHILON 2 0 FS 18 (SUTURE) IMPLANT
SUT MNCRL AB 3-0 PS2 18 (SUTURE) IMPLANT
SUT MNCRL AB 4-0 PS2 18 (SUTURE) IMPLANT
SUT MON AB 5-0 PS2 18 (SUTURE) ×8 IMPLANT
SUT NOVA 1 T20/GS 25DT (SUTURE) IMPLANT
SUT PDS AB 1 TP1 96 (SUTURE) IMPLANT
SUT PDS AB 2-0 CT1 27 (SUTURE) IMPLANT
SUT PDS AB 3-0 SH 27 (SUTURE) IMPLANT
SUT PROLENE 1 CT (SUTURE) IMPLANT
SUT SILK 3 0 PS 1 (SUTURE) ×6 IMPLANT
SUT VIC AB 2-0 CT1 18 (SUTURE) ×2 IMPLANT
SUT VIC AB 3-0 54X BRD REEL (SUTURE) IMPLANT
SUT VIC AB 3-0 BRD 54 (SUTURE)
SUT VIC AB 3-0 PS2 18 (SUTURE)
SUT VIC AB 3-0 PS2 18XBRD (SUTURE) IMPLANT
SUT VIC AB 3-0 SH 18 (SUTURE) ×2 IMPLANT
SUT VIC AB 3-0 SH 27 (SUTURE)
SUT VIC AB 3-0 SH 27XBRD (SUTURE) IMPLANT
SUT VIC AB 3-0 SH 8-18 (SUTURE) ×6 IMPLANT
SUT VIC AB 4-0 PS2 27 (SUTURE) ×10 IMPLANT
SUT VIC AB 5-0 PS2 18 (SUTURE) ×10 IMPLANT
SUT VICRYL 4-0 PS2 18IN ABS (SUTURE) IMPLANT
SUT VICRYL AB 2 0 TIES (SUTURE) ×2 IMPLANT
SYR BULB IRRIGATION 50ML (SYRINGE) ×2 IMPLANT
TOWEL OR 17X24 6PK STRL BLUE (TOWEL DISPOSABLE) ×4 IMPLANT
TOWEL OR 17X26 10 PK STRL BLUE (TOWEL DISPOSABLE) ×4 IMPLANT
TRAY FOLEY CATH 14FRSI W/METER (CATHETERS) ×2 IMPLANT
WATER STERILE IRR 1000ML POUR (IV SOLUTION) IMPLANT

## 2011-09-29 NOTE — Anesthesia Procedure Notes (Signed)
Procedure Name: Intubation Date/Time: 09/29/2011 8:53 AM Performed by: Marena Chancy Pre-anesthesia Checklist: Emergency Drugs available, Patient identified, Timeout performed, Suction available and Patient being monitored Patient Re-evaluated:Patient Re-evaluated prior to inductionOxygen Delivery Method: Circle system utilized Preoxygenation: Pre-oxygenation with 100% oxygen Intubation Type: IV induction Ventilation: Mask ventilation without difficulty Laryngoscope Size: Miller and 2 Grade View: Grade I Tube type: Oral Number of attempts: 1 Placement Confirmation: ETT inserted through vocal cords under direct vision,  positive ETCO2 and breath sounds checked- equal and bilateral Secured at: 20 cm Tube secured with: Tape Dental Injury: Teeth and Oropharynx as per pre-operative assessment

## 2011-09-29 NOTE — Transfer of Care (Signed)
Immediate Anesthesia Transfer of Care Note  Patient: Toni Warren  Procedure(s) Performed: Procedure(s) (LRB): HERNIA REPAIR VENTRAL ADULT (N/A) ABDOMINOPLASTY (N/A)  Patient Location: PACU  Anesthesia Type: General  Level of Consciousness: awake, alert  and oriented  Airway & Oxygen Therapy: Patient Spontanous Breathing and Patient connected to nasal cannula oxygen  Post-op Assessment: Report given to PACU RN, Post -op Vital signs reviewed and stable and Patient moving all extremities X 4  Post vital signs: Reviewed and stable  Complications: No apparent anesthesia complications

## 2011-09-29 NOTE — H&P (Signed)
Toni Warren is an 26 y.o. female.   Chief Complaint: abdominal hernia HPI: The patient is a 26 yrs old bf here for repair of her abdominal hernia and rectus diastasis with abdominoplasty.    Past Medical History  Diagnosis Date  . Anemia   . Hyperlipidemia     postpartum 2+yrs ago  . Hypertension     postpartum 2+yrs ago  . History of bronchitis 3+yrs ago  . History of migraine 09/20/11-last one  . GERD (gastroesophageal reflux disease)     only takes OTC meds  . Sickle cell trait   . Anxiety   . Depression     but doesn't require meds    Past Surgical History  Procedure Date  . Ovarian cyst removal 2012  . Eye surgery   . Eye surgery 1990's    History reviewed. No pertinent family history. Social History:  reports that she has been passively smoking.  She has never used smokeless tobacco. She reports that she does not drink alcohol or use illicit drugs.  Allergies:  Allergies  Allergen Reactions  . Percocet (Oxycodone-Acetaminophen) Itching    Medications Prior to Admission  Medication Sig Dispense Refill  . acetaminophen (TYLENOL) 500 MG tablet Take 1,000 mg by mouth every 6 (six) hours as needed. For pain      . aspirin-acetaminophen-caffeine (EXCEDRIN MIGRAINE) 250-250-65 MG per tablet Take 1 tablet by mouth daily as needed. For migraines        Results for orders placed during the hospital encounter of 09/29/11 (from the past 48 hour(s))  COMPREHENSIVE METABOLIC PANEL     Status: Abnormal   Collection Time   09/29/11  7:20 AM      Component Value Range Comment   Sodium 141  135 - 145 mEq/L    Potassium 3.9  3.5 - 5.1 mEq/L    Chloride 105  96 - 112 mEq/L    CO2 26  19 - 32 mEq/L    Glucose, Bld 101 (*) 70 - 99 mg/dL    BUN 13  6 - 23 mg/dL    Creatinine, Ser 0.98  0.50 - 1.10 mg/dL    Calcium 9.3  8.4 - 11.9 mg/dL    Total Protein 7.1  6.0 - 8.3 g/dL    Albumin 3.9  3.5 - 5.2 g/dL    AST 23  0 - 37 U/L    ALT 28  0 - 35 U/L    Alkaline Phosphatase  69  39 - 117 U/L    Total Bilirubin 0.4  0.3 - 1.2 mg/dL    GFR calc non Af Amer >90  >90 mL/min    GFR calc Af Amer >90  >90 mL/min    Chest 2 View  09/29/2011  *RADIOLOGY REPORT*  Clinical Data:  Preoperative respiratory exam for ventral hernia repair and abdominoplasty.  CHEST - 2 VIEW  Comparison: None  Findings: The heart size and mediastinal contours are within normal limits.  Both lungs are clear.  The visualized skeletal structures are unremarkable.  IMPRESSION: No active disease.  Original Report Authenticated By: Reola Calkins, M.D.    Review of Systems  Constitutional: Negative.   HENT: Negative.   Eyes: Negative.   Respiratory: Negative.   Cardiovascular: Negative.   Gastrointestinal: Negative.   Genitourinary: Negative.   Musculoskeletal: Negative.   Skin: Negative.   Neurological: Negative.   Endo/Heme/Allergies: Negative.   Psychiatric/Behavioral: Negative.     Blood pressure 129/90, pulse 95, temperature 98.1  F (36.7 C), resp. rate 20, SpO2 99.00%. Physical Exam  Constitutional: She appears well-developed and well-nourished.  HENT:  Head: Normocephalic and atraumatic.  Right Ear: External ear normal.  Left Ear: External ear normal.  Eyes: EOM are normal. Pupils are equal, round, and reactive to light.  Cardiovascular: Normal rate.   Respiratory: Effort normal. No respiratory distress.  GI: Soft. There is no tenderness.  Skin: Skin is warm.  Psychiatric: She has a normal mood and affect. Her behavior is normal. Judgment and thought content normal.     Assessment/Plan Abdominoplasty and repair of rectus diastasis.  Consent was signed and risks and complications were reviewed and include bleeding, pain, scar and risk of anesthesia.  SANGER,CLAIRE 09/29/2011, 8:37 AM

## 2011-09-29 NOTE — Progress Notes (Signed)
Pt states she had been experiencing warm sensation on top of right foot lasting about 3 seconds and it reoccurs several times throughout the day.  Instructed her to let Dr. Luisa Hart and Dr. Kelly Splinter know about this when she speaks to them in holding area.

## 2011-09-29 NOTE — H&P (View-Only) (Signed)
Patient ID: Toni Warren, female   DOB: 04/04/1985, 26 y.o.   MRN: [id number]  Chief Complaint  Patient presents with  . Pre-op Exam    vent hernia    HPI Toni Warren is a 26 y.o. female.   HPI The patient returns to clinic for follow up of her abdominal wall diastases. She is having no abdominal distention and abdominal discomfort now. She denies any nausea or vomiting. The pain is gone nature made worse when she stands and putspressure her abdominal wall.  Past Medical History  Diagnosis Date  . Anemia   . Hyperlipidemia     postpartum 2+yrs ago  . Hypertension     postpartum 2+yrs ago  . History of bronchitis 3+yrs ago  . History of migraine 09/20/11-last one  . GERD (gastroesophageal reflux disease)     only takes OTC meds  . Sickle cell trait   . Anxiety   . Depression     but doesn't require meds    Past Surgical History  Procedure Date  . Ovarian cyst removal 2012  . Eye surgery   . Eye surgery 1990's    History reviewed. No pertinent family history.  Social History History  Substance Use Topics  . Smoking status: Passive Smoker  . Smokeless tobacco: Not on file  . Alcohol Use: No    Allergies  Allergen Reactions  . Percocet (Oxycodone-Acetaminophen) Itching    Current Outpatient Prescriptions  Medication Sig Dispense Refill  . acetaminophen (TYLENOL) 500 MG tablet Take 1,000 mg by mouth every 6 (six) hours as needed. For pain      . aspirin-acetaminophen-caffeine (EXCEDRIN MIGRAINE) 250-250-65 MG per tablet Take 1 tablet by mouth daily as needed. For migraines        Review of Systems Review of Systems  Constitutional: Negative for fever, chills and unexpected weight change.  HENT: Negative for hearing loss, congestion, sore throat, trouble swallowing and voice change.   Eyes: Negative for visual disturbance.  Respiratory: Negative for cough and wheezing.   Cardiovascular: Negative for chest pain, palpitations and leg swelling.    Gastrointestinal: Positive for abdominal pain and abdominal distention. Negative for nausea, vomiting, diarrhea, constipation, blood in stool and anal bleeding.  Genitourinary: Negative for hematuria, vaginal bleeding and difficulty urinating.  Musculoskeletal: Negative for arthralgias.  Skin: Negative for rash and wound.  Neurological: Negative for seizures, syncope and headaches.  Hematological: Negative for adenopathy. Does not bruise/bleed easily.  Psychiatric/Behavioral: Negative for confusion.    Blood pressure 126/68, pulse 96, temperature 98.1 F (36.7 C), temperature source Temporal, resp. rate 16, height 5' 2" (1.575 m), weight 150 lb 3.2 oz (68.13 kg), last menstrual period 09/07/2011.  Physical Exam Physical Exam  Constitutional: She is oriented to person, place, and time. She appears well-developed and well-nourished.  HENT:  Head: Normocephalic and atraumatic.  Eyes: EOM are normal. Pupils are equal, round, and reactive to light.  Neck: Normal range of motion. Neck supple.  Cardiovascular: Normal rate and regular rhythm.   Pulmonary/Chest: Effort normal and breath sounds normal.  Abdominal: large midline diastases with periumbilical ventral hernia component measuring 4 x 6 cm reducible.   Musculoskeletal: Normal range of motion.  Neurological: She is alert and oriented to person, place, and time.  Skin: Skin is warm and dry.  Psychiatric: She has a normal mood and affect. Her behavior is normal. Judgment and thought content normal.       Assessment    Ventral hernia and diastasis recti      Plan    Will need complex repair of hernia with plastic surgery.  She is seeing Dr Sanger today to discuss further but her condition has worsened since I last saw her.  She now has a hernia and more pain.The risk of hernia repair include bleeding,  Infection,   Recurrence of the hernia,  Mesh use, chronic pain,  Organ injury,  Bowel injury,  Bladder injury,   nerve injury with  numbness around the incision,  Death,  and worsening of preexisting  medical problems.  The alternatives to surgery have been discussed as well..  Long term expectations of both operative and non operative treatments have been discussed.   The patient agrees to proceed.       CORNETT,THOMAS A. 09/23/2011, 11:03 AM    

## 2011-09-29 NOTE — Anesthesia Preprocedure Evaluation (Signed)
Anesthesia Evaluation  Patient identified by MRN, date of birth, ID band Patient awake    Reviewed: Allergy & Precautions, H&P , NPO status , Patient's Chart, lab work & pertinent test results  Airway Mallampati: I TM Distance: >3 FB Neck ROM: Full    Dental  (+) Teeth Intact and Dental Advisory Given   Pulmonary  breath sounds clear to auscultation        Cardiovascular hypertension, Pt. on medications Rhythm:Regular Rate:Normal     Neuro/Psych    GI/Hepatic   Endo/Other    Renal/GU      Musculoskeletal   Abdominal   Peds  Hematology   Anesthesia Other Findings   Reproductive/Obstetrics                           Anesthesia Physical Anesthesia Plan  ASA: II  Anesthesia Plan: General   Post-op Pain Management:    Induction: Intravenous  Airway Management Planned: Oral ETT  Additional Equipment:   Intra-op Plan:   Post-operative Plan: Extubation in OR  Informed Consent: I have reviewed the patients History and Physical, chart, labs and discussed the procedure including the risks, benefits and alternatives for the proposed anesthesia with the patient or authorized representative who has indicated his/her understanding and acceptance.   Dental advisory given  Plan Discussed with: CRNA, Anesthesiologist and Surgeon  Anesthesia Plan Comments:         Anesthesia Quick Evaluation  

## 2011-09-29 NOTE — Preoperative (Signed)
Beta Blockers   Reason not to administer Beta Blockers:Not Applicable 

## 2011-09-29 NOTE — Anesthesia Postprocedure Evaluation (Signed)
  Anesthesia Post-op Note  Patient: Toni Warren  Procedure(s) Performed: Procedure(s) (LRB): HERNIA REPAIR VENTRAL ADULT (N/A) ABDOMINOPLASTY (N/A)  Patient Location: PACU  Anesthesia Type: General  Level of Consciousness: awake, alert  and oriented  Airway and Oxygen Therapy: Patient Spontanous Breathing and Patient connected to nasal cannula oxygen  Post-op Pain: mild  Post-op Assessment: Post-op Vital signs reviewed  Post-op Vital Signs: Reviewed  Complications: No apparent anesthesia complications

## 2011-09-29 NOTE — Op Note (Signed)
Preoperative diagnosis: 6 x 8 cm ventral  periumbilical hernia with abdominal wall diastases  Postoperative diagnosis: Same  Procedure: Primary repair of ventral  periumbilical hernia and diastases  Surgeon: Harriette Bouillon MD  Co-surgeon: Dr. Shella Spearing DO   Anesthesia: Gen. Endotracheal anesthesia  Indications for procedure: The patient presents with severe abdominal wall diastases and suspected ventral hernia in the periumbilical region. She is scheduled for an abdominal plasty and assessment of her abdominal wall with repair of hernia and plication of abdominal wall. She was seen in consultation by myself and Dr. Kelly Splinter. Risks, benefits and alternative therapies discussed with her.The risk of hernia repair include bleeding,  Infection,   Recurrence of the hernia,  Mesh use, chronic pain,  Organ injury,  Bowel injury,  Bladder injury,   nerve injury with numbness around the incision,  Death,  and worsening of preexisting  medical problems.  The alternatives to surgery have been discussed as well..  Long term expectations of both operative and non operative treatments have been discussed.   The patient agrees to proceed.  Description of procedure: The patient was met in the holding area. Questions were answered. She's also seen by Dr. Kelly Splinter in the abdominal wall was marked by her. She's taken back to the operating room and placed supine on the operating room table. After induction of general anesthesia, the abdomen was prepped and draped in sterile fashion and Foley catheter was placed under sterile conditions. Timeout was done. She received preoperative Ancef 2 g IV. A transverse abdominal incision was made in the inferior fold of her pannus as marked by Dr. Kelly Splinter. Dissection was carried down to the abdominal wall. The flap was then raised at this point by Dr. Kelly Splinter. Please see her operative details of this. Upon inspection of the area umbilical fascia there is significant thinning of the linea  alba and separation of both rectus muscles in this area by almost 7 cm. This abdominal wall defect was closed with interrupted #1 Ethibond. There was an extremely thin layer of fascia that was present therefore we felt that pulling The rectus abdominis muscles together in this area would be best. Acell t was used for the incision to prevent keloid formation. The remainder the procedure was performed by Dr. Kelly Splinter. Please see her note for details. At this point the case all counts are found to be correct.

## 2011-09-29 NOTE — Brief Op Note (Signed)
09/29/2011  12:26 PM  PATIENT:  Suzan Slick Oxendine  26 y.o. female  PRE-OPERATIVE DIAGNOSIS:  ventral hernia, abdominal diastasis  POST-OPERATIVE DIAGNOSIS:  ventral hernia, abdominal diastasis  PROCEDURE:  Procedure(s) (LRB): HERNIA REPAIR VENTRAL ADULT (N/A) ABDOMINOPLASTY (N/A)  SURGEON:  Surgeon(s) and Role: Panel 1:    * Thomas A. Cornett, MD - Primary  Panel 2:    * Claire Sanger, DO - Primary  PHYSICIAN ASSISTANT: none  ASSISTANTS: Harrie Foreman, LPN   ANESTHESIA:   general  EBL:  Total I/O In: 2000 [I.V.:2000] Out: 230 [Urine:140; Blood:90]  BLOOD ADMINISTERED:none  DRAINS: (2) Jackson-Pratt drain(s) with closed bulb suction in the abdominal wall   LOCAL MEDICATIONS USED:  NONE  SPECIMEN:  No Specimen  DISPOSITION OF SPECIMEN:  N/A  COUNTS:  YES  TOURNIQUET:  * No tourniquets in log *  DICTATION: dictated  PLAN OF CARE: Discharge to home after PACU  PATIENT DISPOSITION:  PACU - hemodynamically stable.   Delay start of Pharmacological VTE agent (>24hrs) due to surgical blood loss or risk of bleeding: no

## 2011-09-29 NOTE — Interval H&P Note (Signed)
History and Physical Interval Note:  09/29/2011 8:18 AM  Toni Warren  has presented today for surgery, with the diagnosis of ventral hernia  The various methods of treatment have been discussed with the patient and family. After consideration of risks, benefits and other options for treatment, the patient has consented to  Procedure(s) (LRB): HERNIA REPAIR VENTRAL ADULT (N/A) ABDOMINOPLASTY (N/A) as a surgical intervention .  The patient's history has been reviewed, patient examined, no change in status, stable for surgery.  I have reviewed the patient's chart and labs.  Questions were answered to the patient's satisfaction.     CORNETT,THOMAS A.

## 2011-09-30 ENCOUNTER — Telehealth (INDEPENDENT_AMBULATORY_CARE_PROVIDER_SITE_OTHER): Payer: Self-pay | Admitting: General Surgery

## 2011-09-30 ENCOUNTER — Encounter (HOSPITAL_COMMUNITY): Payer: Self-pay | Admitting: Surgery

## 2011-09-30 MED ORDER — SUCRALFATE 1 GM/10ML PO SUSP
1.0000 g | Freq: Once | ORAL | Status: AC
  Administered 2011-09-30: 1 g via ORAL
  Filled 2011-09-30: qty 10

## 2011-09-30 MED ORDER — PANTOPRAZOLE SODIUM 40 MG IV SOLR
40.0000 mg | Freq: Every day | INTRAVENOUS | Status: DC
  Filled 2011-09-30: qty 40

## 2011-09-30 NOTE — Op Note (Signed)
NAMEMarland Kitchen  MELONEY, FELD NO.:  [id number]  MEDICAL RECORD NO.:  [id number]  LOCATION: [facility]             FACILITY:  [facility]  PHYSICIAN:  Wayland Denis, DO      DATE OF BIRTH:  October 26, 1985  DATE OF PROCEDURE:  09/29/2011 DATE OF DISCHARGE:                              OPERATIVE REPORT   PREOPERATIVE DIAGNOSIS:  Rectus diastasis with excess abdominal skin.  POSTOPERATIVE DIAGNOSIS:  Rectus diastasis with excess abdominal skin.  PROCEDURE:  Abdominoplasty.  ATTENDING SURGEON:  Wayland Denis, DO  ANESTHESIA:  General.  ASSISTANT:  Harrie Foreman, LPN  INDICATION FOR PROCEDURE:  The patient is a 26 year old female who has had an increase in her abdominal wall laxity since her pregnancy to the point of concern for abdominal hernia.  Risks and complications were discussed for repair and abdominoplasty including bleeding, pain, scar, risk of anesthesia, the patient was seen in holding, progress was discussed.  Consent was signed.  The patient was taken to the operating room.  DESCRIPTION OF PROCEDURE:  Once in the operating room, the patient was placed on the operating room table in supine position.  General anesthesia was administered.  Once adequate, a time-out was called.  All information was confirmed to be correct.  The patient was prepped and draped in the usual sterile fashion.  General Surgery began their portion of the case with an incision of the lower marking of the abdominal wall that was marked up the abdominal skin, which was marked preoperatively.  The abdominal soft tissue and skin was lifted off the rectus muscle and then hemostasis was achieved with electrocautery. Once the rectus diastasis was prepared by General Surgery, the patient was rendered to the Plastic Surgery Service.  The soft tissue was lifted from the sternal apex centrally and costal margin and hemostasis was achieved with electrocautery.  The abdominal rectus muscle was  plicated on both sides using 1-0 Ethibond, a running stitch was used to reapproximate the skin edges.  The area was irrigated with antibiotic solution and hemostasis was achieved with electrocautery.  Skin was then split down the portion of the umbilicus and hemostasis was achieved with electrocautery.  The fascia was closed with 3-0 Vicryl followed by 4-0 Vicryl and a running subcuticular 5-0 Monocryl was used to close the Skin.  A drain was placed on each side prior to closure. It was secured to the skin with 3-0 silk.  The umbilicus was recreated with a V-type  Incision at the location of the anterior iliac spine and location of the original umbilicus.  The skin of the abdominal wall was tethered with 5-0 Vicryl to help recreate a inward-looking umbilicus. The skin edges were reapproximated with 5-0 Monocryl.  Dermabond was applied with ABDs and an abdominal binder.  The patient tolerated the procedure well and there were no complications.  She was then awakened and taken to the recovery room in stable condition.     Wayland Denis, DO     CS/MEDQ  D:  09/29/2011  T:  09/30/2011  Job:  [id number]

## 2011-09-30 NOTE — Telephone Encounter (Signed)
Pt called having low grade fevers.  No nausea or vomiting or severe abd pain.  Instructed her to talk to Dr Leonie Green office in case they want to look at her wound.

## 2011-09-30 NOTE — Discharge Summary (Signed)
Physician Discharge Summary  Patient ID: JODILYN GIESE MRN: [id number] DOB/AGE: Sep 02, 1985 26 y.o.  Admit date: 09/29/2011 Discharge date: 09/30/2011  Admission Diagnoses:  Discharge Diagnoses:  Active Problems:  Abdominal hernia   Discharged Condition: good  Hospital Course: The patient was admitted and taken to the operating room for repair of her abdominal hernia and rectus diastasis.  In addition she underwent an abdominoplasty.  She tolerated the procedure well and did not have any complications.  She was managed in the surgical unit postoperatively. She was voiding, tolerating food well and her pain was well controlled.  She was ambulating without difficulty.  Consults: None  Significant Diagnostic Studies: labs  Treatments: surgery: abdominal hernia repair and abdominoplasty  Discharge Exam: Blood pressure 128/76, pulse 88, temperature 98.2 F (36.8 C), temperature source Oral, resp. rate 20, height 5\' 2"  (1.575 m), weight 68.12 kg (150 lb 2.8 oz), SpO2 99.00%. General appearance: alert, cooperative and no distress Incisions dry and intact. Disposition: 01-Home or Self Care  Discharge Orders    Future Appointments: Provider: Department: Dept Phone: Center:   10/15/2011 11:20 AM Maisie Fus A. Cornett, MD [facility] [phone] None     Medication List  As of 09/30/2011  6:32 AM   TAKE these medications         acetaminophen 500 MG tablet   Commonly known as: TYLENOL   Take 1,000 mg by mouth every 6 (six) hours as needed. For pain      aspirin-acetaminophen-caffeine 250-250-65 MG per tablet   Commonly known as: EXCEDRIN MIGRAINE   Take 1 tablet by mouth daily as needed. For migraines           Follow-up Information    Follow up with [facility], DO in 1 week.   Contact information:   [address] [place] [address] [phone]          Signed: Wayland Denis 09/30/2011, 6:32 AM

## 2011-09-30 NOTE — Progress Notes (Signed)
Patient discharged to home with instructions with return demonstration, answered questions and verbalized understanding.

## 2011-10-15 ENCOUNTER — Encounter (INDEPENDENT_AMBULATORY_CARE_PROVIDER_SITE_OTHER): Admitting: Surgery

## 2013-03-27 ENCOUNTER — Inpatient Hospital Stay (HOSPITAL_COMMUNITY)
Admission: AD | Admit: 2013-03-27 | Discharge: 2013-03-27 | Disposition: A | Discharge status: Home / Self Care | Payer: BC Managed Care – PPO | Source: Ambulatory Visit | Attending: Obstetrics & Gynecology | Admitting: Obstetrics & Gynecology

## 2013-03-27 DIAGNOSIS — R109 Unspecified abdominal pain: Secondary | ICD-10-CM | POA: Insufficient documentation

## 2013-03-27 DIAGNOSIS — N921 Excessive and frequent menstruation with irregular cycle: Secondary | ICD-10-CM | POA: Insufficient documentation

## 2013-03-27 DIAGNOSIS — Z3202 Encounter for pregnancy test, result negative: Secondary | ICD-10-CM | POA: Diagnosis not present

## 2013-03-27 LAB — URINALYSIS, ROUTINE W REFLEX MICROSCOPIC
Bilirubin Urine: NEGATIVE
Glucose, UA: NEGATIVE mg/dL
Ketones, ur: NEGATIVE mg/dL
Leukocytes, UA: NEGATIVE
Nitrite: NEGATIVE
Protein, ur: NEGATIVE mg/dL
Specific Gravity, Urine: <1.005 — ABNORMAL LOW (ref 1.005–1.030)
Urobilinogen, UA: 0.2 mg/dL (ref 0.0–1.0)
pH: 6 (ref 5.0–8.0)

## 2013-03-27 LAB — CBC
HCT: 41 % (ref 36.0–46.0)
Hemoglobin: 14 g/dL (ref 12.0–15.0)
MCH: 29 pg (ref 26.0–34.0)
MCHC: 34.1 g/dL (ref 30.0–36.0)
MCV: 84.9 fL (ref 78.0–100.0)
Platelets: 359 10*3/uL (ref 150–400)
RBC: 4.83 MIL/uL (ref 3.87–5.11)
RDW: 13.2 % (ref 11.5–15.5)
WBC: 7.5 10*3/uL (ref 4.0–10.5)

## 2013-03-27 LAB — POCT PREGNANCY, URINE: Preg Test, Ur: NEGATIVE

## 2013-03-27 LAB — URINE MICROSCOPIC-ADD ON

## 2013-03-27 MED ORDER — NORGESTIMATE-ETH ESTRADIOL 0.25-35 MG-MCG PO TABS
ORAL_TABLET | ORAL | Status: DC
Start: 2013-03-27 — End: 2013-08-03

## 2013-03-27 NOTE — MAU Provider Note (Signed)
S: 28 y.o. presents to MAU with abdominal cramping, irregular bleeding and spotting every day x2 weeks, and hx of irregular menses.  She recently stopped taking birth control pills and is concerned she could be pregnant.  She denies vaginal itching/burning, urinary symptoms, h/a, dizziness, n/v, or fever/chills.    O: BP 140/96  Pulse 73  Temp(Src) 98.3 F (36.8 C) (Oral)  Resp 16  Ht 5' 1.5" (1.562 m)  Wt 70.489 kg (155 lb 6.4 oz)  BMI 28.89 kg/m2  SpO2 100%  LMP 03/07/2013  Physical Examination: General appearance - alert, well appearing, and in no distress and oriented to person, place, and time Respirations unlabored  Results for orders placed during the hospital encounter of 03/27/13 (from the past 24 hour(s))  URINALYSIS, ROUTINE W REFLEX MICROSCOPIC     Status: Abnormal   Collection Time    03/27/13  6:55 PM      Result Value Range   Color, Urine STRAW (*) YELLOW   APPearance CLEAR  CLEAR   Specific Gravity, Urine <1.005 (*) 1.005 - 1.030   pH 6.0  5.0 - 8.0   Glucose, UA NEGATIVE  NEGATIVE mg/dL   Hgb urine dipstick TRACE (*) NEGATIVE   Bilirubin Urine NEGATIVE  NEGATIVE   Ketones, ur NEGATIVE  NEGATIVE mg/dL   Protein, ur NEGATIVE  NEGATIVE mg/dL   Urobilinogen, UA 0.2  0.0 - 1.0 mg/dL   Nitrite NEGATIVE  NEGATIVE   Leukocytes, UA NEGATIVE  NEGATIVE  URINE MICROSCOPIC-ADD ON     Status: None   Collection Time    03/27/13  6:55 PM      Result Value Range   Squamous Epithelial / LPF RARE  RARE   WBC, UA 0-2  <3 WBC/hpf   RBC / HPF 0-2  <3 RBC/hpf   Bacteria, UA RARE  RARE  POCT PREGNANCY, URINE     Status: None   Collection Time    03/27/13  7:10 PM      Result Value Range   Preg Test, Ur NEGATIVE  NEGATIVE  CBC     Status: None   Collection Time    03/27/13  7:40 PM      Result Value Range   WBC 7.5  4.0 - 10.5 K/uL   RBC 4.83  3.87 - 5.11 MIL/uL   Hemoglobin 14.0  12.0 - 15.0 g/dL   HCT 41.0  36.0 - 46.0 %   MCV 84.9  78.0 - 100.0 fL   MCH 29.0  26.0  - 34.0 pg   MCHC 34.1  30.0 - 36.0 g/dL   RDW 13.2  11.5 - 15.5 %   Platelets 359  150 - 400 K/uL    A: Negative pregnancy test Metrorrhagia  P: D/C home Reviewed normal labs with pt Appt in clinic on Thursday OCPs (take 2/day x3 days, then daily to stop bleeding) Return to MAU as needed  Fatima Blank Certified [place] Certified Nurse-Midwife

## 2013-03-27 NOTE — MAU Note (Signed)
Pt to follow up in clinic on Thursday per Leftwich-Kirby,CNM

## 2013-03-27 NOTE — MAU Note (Signed)
Patient states she had her last period on 1-21. States she had spotting for a few days then stopped but has had slight bleeding and passing clots for the past 8 days. Having a lower abdominal pulling and pressure for about 8 days. Denies vomiting, has nausea. Denies S/S of the flu.

## 2013-03-28 NOTE — MAU Provider Note (Signed)
Attestation of Attending Supervision of Advanced Practitioner (CNM/NP): Evaluation and management procedures were performed by the Advanced Practitioner under my supervision and collaboration. I have reviewed the Advanced Practitioner's note and chart, and I agree with the management and plan.  LEGGETT,KELLY H. 6:55 AM

## 2013-03-29 ENCOUNTER — Encounter: Payer: Self-pay | Admitting: Obstetrics & Gynecology

## 2013-03-29 ENCOUNTER — Ambulatory Visit (INDEPENDENT_AMBULATORY_CARE_PROVIDER_SITE_OTHER): Payer: BC Managed Care – PPO | Admitting: Obstetrics & Gynecology

## 2013-03-29 VITALS — BP 135/93 | HR 97 | Temp 98.8°F | Ht 62.0 in | Wt 154.6 lb

## 2013-03-29 DIAGNOSIS — Z3009 Encounter for other general counseling and advice on contraception: Secondary | ICD-10-CM

## 2013-03-29 DIAGNOSIS — N920 Excessive and frequent menstruation with regular cycle: Secondary | ICD-10-CM | POA: Insufficient documentation

## 2013-03-29 DIAGNOSIS — Z719 Counseling, unspecified: Secondary | ICD-10-CM | POA: Insufficient documentation

## 2013-03-29 MED ORDER — LEVONORGEST-ETH ESTRAD 91-DAY 0.15-0.03 &0.01 MG PO TABS: 1.0000 | ORAL_TABLET | Freq: Every day | ORAL | Status: DC

## 2013-03-29 NOTE — Patient Instructions (Signed)
Oral Contraception Information  Oral contraceptive pills (OCPs) are medicines taken to prevent pregnancy. OCPs work by preventing the ovaries from releasing eggs. The hormones in OCPs also cause the cervical mucus to thicken, preventing the sperm from entering the uterus. The hormones also cause the uterine lining to become thin, not allowing a fertilized egg to attach to the inside of the uterus. OCPs are highly effective when taken exactly as prescribed. However, OCPs do not prevent sexually transmitted diseases (STDs). Safe sex practices, such as using condoms along with the pill, can help prevent STDs.   Before taking the pill, you may have a physical exam and Pap test. Your health care provider may order blood tests. The health care provider will make sure you are a good candidate for oral contraception. Discuss with your health care provider the possible side effects of the OCP you may be prescribed. When starting an OCP, it can take 2 to 3 months for the body to adjust to the changes in hormone levels in your body.   TYPES OF ORAL CONTRACEPTION  · The combination pill This pill contains estrogen and progestin (synthetic progesterone) hormones. The combination pill comes in 21-day, 28-day, or 91-day packs. Some types of combination pills are meant to be taken continuously (365-day pills). With 21-day packs, you do not take pills for 7 days after the last pill. With 28-day packs, the pill is taken every day. The last 7 pills are without hormones. Certain types of pills have more than 21 hormone-containing pills. With 91-day packs, the first 84 pills contain both hormones, and the last 7 pills contain no hormones or contain estrogen only.  · The minipill This pill contains the progesterone hormone only. The pill is taken every day continuously. It is very important to take the pill at the same time each day. The minipill comes in packs of 28 pills. All 28 pills contain the hormone.    ADVANTAGES OF ORAL  CONTRACEPTIVE PILLS  · Decreases premenstrual symptoms.    · Treats menstrual period cramps.    · Regulates the menstrual cycle.    · Decreases a heavy menstrual flow.    · May treat acne, depending on the type of pill.    · Treats abnormal uterine bleeding.    · Treats polycystic ovarian syndrome.    · Treats endometriosis.    · Can be used as emergency contraception.    THINGS THAT CAN MAKE ORAL CONTRACEPTIVE PILLS LESS EFFECTIVE  OCPs can be less effective if:   · You forget to take the pill at the same time every day.    · You have a stomach or intestinal disease that lessens the absorption of the pill.    · You take OCPs with other medicines that make OCPs less effective, such as antibiotics, certain HIV medicines, and some seizure medicines.    · You take expired OCPs.    · You forget to restart the pill on day 7, when using the packs of 21 pills.    RISKS ASSOCIATED WITH ORAL CONTRACEPTIVE PILLS   Oral contraceptive pills can sometimes cause side effects, such as:  · Headache.  · Nausea.  · Breast tenderness.  · Irregular bleeding or spotting.  Combination pills are also associated with a small increased risk of:  · Blood clots.  · Heart attack.  · Stroke.  Document Released: 04/24/2002 Document Revised: 11/22/2012 Document Reviewed: 07/23/2012  [employer]® Patient Information ©2014 [employer].

## 2013-03-30 NOTE — Progress Notes (Signed)
Patient ID: Toni Warren, female   DOB: Dec 07, 1985, 28 y.o.   MRN: [id number]   Original Note by Elvera Maria, CNM (Certified Nurse Midwife) filed at 03/27/2013 9:07 PM    S:  28 y.o. presents to MAU with abdominal cramping, irregular bleeding and spotting every day x2 weeks, and hx of irregular menses. She recently stopped taking birth control pills and is concerned she could be pregnant. She denies vaginal itching/burning, urinary symptoms, h/a, dizziness, n/v, or fever/chills.  O:  BP 140/96  Pulse 73  Temp(Src) 98.3 F (36.8 C) (Oral)  Resp 16  Ht 5' 1.5" (1.562 m)  Wt 70.489 kg (155 lb 6.4 oz)  BMI 28.89 kg/m2  SpO2 100%  LMP 03/07/2013  Physical Examination: General appearance - alert, well appearing, and in no distress and oriented to person, place, and time  Respirations unlabored     Results for orders placed during the hospital encounter of 03/27/13 (from the past 24 hour(s))     URINALYSIS, ROUTINE W REFLEX MICROSCOPIC Status: Abnormal      Collection Time      03/27/13 6:55 PM     Result  Value  Range      Color, Urine  STRAW (*)  YELLOW      APPearance  CLEAR  CLEAR      Specific Gravity, Urine  <1.005 (*)  1.005 - 1.030      pH  6.0  5.0 - 8.0      Glucose, UA  NEGATIVE  NEGATIVE mg/dL      Hgb urine dipstick  TRACE (*)  NEGATIVE      Bilirubin Urine  NEGATIVE  NEGATIVE      Ketones, ur  NEGATIVE  NEGATIVE mg/dL      Protein, ur  NEGATIVE  NEGATIVE mg/dL      Urobilinogen, UA  0.2  0.0 - 1.0 mg/dL      Nitrite  NEGATIVE  NEGATIVE      Leukocytes, UA  NEGATIVE  NEGATIVE     URINE MICROSCOPIC-ADD ON Status: None      Collection Time      03/27/13 6:55 PM     Result  Value  Range      Squamous Epithelial / LPF  RARE  RARE      WBC, UA  0-2  <3 WBC/hpf      RBC / HPF  0-2  <3 RBC/hpf      Bacteria, UA  RARE  RARE     POCT PREGNANCY, URINE Status: None      Collection Time      03/27/13 7:10 PM     Result  Value  Range      Preg Test, Ur  NEGATIVE   NEGATIVE     CBC Status: None      Collection Time      03/27/13 7:40 PM     Result  Value  Range      WBC  7.5  4.0 - 10.5 K/uL      RBC  4.83  3.87 - 5.11 MIL/uL      Hemoglobin  14.0  12.0 - 15.0 g/dL      HCT  41.0  36.0 - 46.0 %      MCV  84.9  78.0 - 100.0 fL      MCH  29.0  26.0 - 34.0 pg      MCHC  34.1  30.0 - 36.0 g/dL  RDW  13.2  11.5 - 15.5 %      Platelets  359  150 - 400 K/uL     A:  Negative pregnancy test  Metrorrhagia  P:  D/C home  Reviewed normal labs with pt  Appt in clinic on Thursday  OCPs (take 2/day x3 days, then daily to stop bleeding)  Return to MAU as needed  Fatima Blank  Certified [place]  Certified Nurse-Midwife    Reviewed recent MAU visit. Would like to restart OCP which gave good cycle control in the past.  Exam of pelvis deferred today. Rx OCP, f/u routine in 1 year.  03/30/2013

## 2013-04-26 ENCOUNTER — Encounter: Admitting: Obstetrics & Gynecology

## 2013-06-03 ENCOUNTER — Emergency Department (HOSPITAL_COMMUNITY)
Admission: EM | Admit: 2013-06-03 | Discharge: 2013-06-03 | Disposition: A | Discharge status: Home / Self Care | Payer: BC Managed Care – PPO | Attending: Emergency Medicine | Admitting: Emergency Medicine

## 2013-06-03 ENCOUNTER — Emergency Department (HOSPITAL_COMMUNITY): Payer: BC Managed Care – PPO

## 2013-06-03 ENCOUNTER — Encounter (HOSPITAL_COMMUNITY): Payer: Self-pay | Admitting: Emergency Medicine

## 2013-06-03 DIAGNOSIS — M25469 Effusion, unspecified knee: Secondary | ICD-10-CM | POA: Insufficient documentation

## 2013-06-03 DIAGNOSIS — Z8709 Personal history of other diseases of the respiratory system: Secondary | ICD-10-CM | POA: Insufficient documentation

## 2013-06-03 DIAGNOSIS — Z862 Personal history of diseases of the blood and blood-forming organs and certain disorders involving the immune mechanism: Secondary | ICD-10-CM | POA: Insufficient documentation

## 2013-06-03 DIAGNOSIS — Z8719 Personal history of other diseases of the digestive system: Secondary | ICD-10-CM | POA: Insufficient documentation

## 2013-06-03 DIAGNOSIS — Z8639 Personal history of other endocrine, nutritional and metabolic disease: Secondary | ICD-10-CM | POA: Insufficient documentation

## 2013-06-03 DIAGNOSIS — Z8659 Personal history of other mental and behavioral disorders: Secondary | ICD-10-CM | POA: Insufficient documentation

## 2013-06-03 DIAGNOSIS — I10 Essential (primary) hypertension: Secondary | ICD-10-CM | POA: Insufficient documentation

## 2013-06-03 DIAGNOSIS — Z79899 Other long term (current) drug therapy: Secondary | ICD-10-CM | POA: Insufficient documentation

## 2013-06-03 DIAGNOSIS — M25569 Pain in unspecified knee: Secondary | ICD-10-CM | POA: Insufficient documentation

## 2013-06-03 DIAGNOSIS — G43909 Migraine, unspecified, not intractable, without status migrainosus: Secondary | ICD-10-CM | POA: Insufficient documentation

## 2013-06-03 DIAGNOSIS — M25561 Pain in right knee: Secondary | ICD-10-CM

## 2013-06-03 MED ORDER — NAPROXEN 500 MG PO TABS: 500.0000 mg | ORAL_TABLET | Freq: Two times a day (BID) | ORAL | Status: DC

## 2013-06-03 NOTE — ED Provider Notes (Signed)
CSN: [id number]     Arrival date & time 06/03/13  1631 History  This chart was scribed for non-physician practitioner working with Toni Dessert, MD by Mercy Moore, ED Scribe. This patient was seen in room TR06C/TR06C and the patient's care was started at 5:43 PM.   Chief Complaint  Patient presents with  . Knee Injury    Right knee      The history is provided by the patient. No language interpreter was used.   HPI Comments: Toni Warren is a 28 y.o. female who presents to the Emergency Department complaining of worsening right knee pain and swelling ongoing for three weeks. Patient reports that last night when walking and straightening her leg she heard clicking. Patient reports her knee pain is exacerbated with movement and alleviated at rest. Patient is uncertain of causative trauma or injury or onset of the pain. However she works at [employer] and does a lot of bending and squatting throughout her work day. Patient has not attempted any forms of treatment, but says she rests the knee whenever possible. Patient denies pain in her right ankle.   Past Medical History  Diagnosis Date  . Anemia   . Hyperlipidemia     postpartum 2+yrs ago  . Hypertension     postpartum 2+yrs ago  . History of bronchitis 3+yrs ago  . History of migraine 09/20/11-last one  . GERD (gastroesophageal reflux disease)     only takes OTC meds  . Sickle cell trait   . Anxiety   . Depression     but doesn't require meds   Past Surgical History  Procedure Laterality Date  . Ovarian cyst removal  2012  . Eye surgery    . Eye surgery  1990's  . Ventral hernia repair  09/29/2011    Procedure: HERNIA REPAIR VENTRAL ADULT;  Surgeon: Joyice Faster. Cornett, MD;  Location: [place];  Service: General;  Laterality: N/A;  . Abdominoplasty  09/29/2011    Procedure: ABDOMINOPLASTY;  Surgeon: Theodoro Kos, DO;  Location: [place];  Service: Plastics;  Laterality: N/A;  ABDOMINOPLASTY FOR REPAIR OF RECTUS  DIASTASIS   No family history on file. History  Substance Use Topics  . Smoking status: Passive Smoke Exposure - Never Smoker  . Smokeless tobacco: Never Used  . Alcohol Use: No   OB History   Grav Para Term Preterm Abortions TAB SAB Ect Mult Living   1         2     Review of Systems  Constitutional: Negative for fever and chills.  Musculoskeletal: Positive for arthralgias. Negative for back pain and joint swelling.      Allergies  Percocet  Home Medications   Prior to Admission medications   Medication Sig Start Date End Date Taking? Authorizing Provider  acetaminophen (TYLENOL) 500 MG tablet Take 1,000 mg by mouth every 6 (six) hours as needed. For pain    Historical Provider, MD  aspirin-acetaminophen-caffeine (EXCEDRIN MIGRAINE) [phone] MG per tablet Take 1 tablet by mouth daily as needed. For migraines    Historical Provider, MD  Levonorgestrel-Ethinyl Estradiol (AMETHIA,CAMRESE) 0.15-0.03 &0.01 MG tablet Take 1 tablet by mouth daily. 03/29/13   Woodroe Mode, MD  norgestimate-ethinyl estradiol (ORTHO-CYCLEN,SPRINTEC,PREVIFEM) 0.25-35 MG-MCG tablet Take two tablets today, two tablets tomorrow, two tablets on day 3, then one tablet daily for the rest of the pack. 03/27/13   Kathie Dike Leftwich-Kirby, CNM   Triage Vitals: BP 132/85  Pulse 96  Temp(Src) 98.2  F (36.8 C) (Oral)  Resp 18  Ht 5\' 2"  (1.575 m)  Wt 157 lb (71.215 kg)  BMI 28.71 kg/m2  SpO2 99%  LMP 06/03/2013 Physical Exam  Nursing note and vitals reviewed. Constitutional: She is oriented to person, place, and time. She appears well-developed and well-nourished. No distress.  HENT:  Head: Normocephalic and atraumatic.  Eyes: EOM are normal.  Neck: Neck supple. No tracheal deviation present.  Cardiovascular: Normal rate.   Pulmonary/Chest: Effort normal. No respiratory distress.  Musculoskeletal: Normal range of motion.  Normal appearing right knee. Tender to palpation over anterior patella tendon. Also  some tenderness over medial joint. Full range of motion of the knee. Pain with active extension against resistance. Negative anterior posterior drawer signs. No laxity or pain with medial lateral stress.  Neurological: She is alert and oriented to person, place, and time.  Skin: Skin is warm and dry.  Psychiatric: She has a normal mood and affect. Her behavior is normal.    ED Course  Procedures (including critical care time) DIAGNOSTIC STUDIES: Oxygen Saturation is 99% on room air, normal by my interpretation.    COORDINATION OF CARE: 5:45 PM- Will order X-ray. Discussed treatment plan with patient at bedside and patient agreed to plan.     Labs Review Labs Reviewed - No data to display  Imaging Review Dg Knee Complete 4 Views Right  06/03/2013   CLINICAL DATA:  Right knee pain and inability to bear weight.  EXAM: RIGHT KNEE - COMPLETE 4+ VIEW  COMPARISON:  None.  FINDINGS: There is no evidence of fracture, dislocation, or joint effusion. There is no evidence of arthropathy or other focal bone abnormality. Soft tissues are unremarkable.  IMPRESSION: Negative.   Electronically Signed   By: Earle Gell M.D.   On: 06/03/2013 18:45     EKG Interpretation None      MDM   Final diagnoses:  None   Patient here with a nontraumatic right knee pain. Full range of motion of the joint. Joint is stable. X-ray obtained and is negative. Given exam findings, most likely patella tendinitis. Patient states she felt some clicking in her knee joint, cannot rule out meniscal injury. I will discharge her home, and is provided, anti-inflammatories at home, followup with orthopedic specialist.   Filed Vitals:   06/03/13 1650 06/03/13 1921  BP: 132/85 119/78  Pulse: 96 81  Temp: 98.2 F (36.8 C) 98.2 F (36.8 C)  TempSrc: Oral Oral  Resp: 18 19  Height: 5\' 2"  (1.575 m)   Weight: 157 lb (71.215 kg)   SpO2: 99% 99%   I personally performed the services described in this documentation, which  was scribed in my presence. The recorded information has been reviewed and is accurate.   Renold Genta, PA-C 06/04/13 [phone]

## 2013-06-03 NOTE — ED Notes (Addendum)
Pt c/o right knee pain ongoing for a couple weeks. Pt cannot recall any injury or exactly when pain began. Pt ambulatory to triage with a limp. Pt reports a "clicking sound and feeling" with ambulation.

## 2013-06-03 NOTE — ED Notes (Signed)
Pt c/o r knee pain for three weeks. Denies injury. No deformity noted.

## 2013-06-03 NOTE — Discharge Instructions (Signed)
Naprosyn for inflammation and pain. Wear knee sleeve for support. Ice. Elevated. Follow up with orthopedics specialist.   Knee Pain Knee pain can be a result of an injury or other medical conditions. Treatment will depend on the cause of your pain. HOME CARE  Only take medicine as told by your doctor.  Keep a healthy weight. Being overweight can make the knee hurt more.  Stretch before exercising or playing sports.  If there is constant knee pain, change the way you exercise. Ask your doctor for advice.  Make sure shoes fit well. Choose the right shoe for the sport or activity.  Protect your knees. Wear kneepads if needed.  Rest when you are tired. GET HELP RIGHT AWAY IF:   Your knee pain does not stop.  Your knee pain does not get better.  Your knee joint feels hot to the touch.  You have a fever. MAKE SURE YOU:   Understand these instructions.  Will watch this condition.  Will get help right away if you are not doing well or get worse. Document Released: 04/30/2008 Document Revised: 04/26/2011 Document Reviewed: 04/30/2008 [facility] Patient Information 2014 [place], [place]. Knee Exercises EXERCISES RANGE OF MOTION(ROM) AND STRETCHING EXERCISES These exercises may help you when beginning to rehabilitate your injury. Your symptoms may resolve with or without further involvement from your physician, physical therapist or athletic trainer. While completing these exercises, remember:   Restoring tissue flexibility helps normal motion to return to the joints. This allows healthier, less painful movement and activity.  An effective stretch should be held for at least 30 seconds.  A stretch should never be painful. You should only feel a gentle lengthening or release in the stretched tissue. STRETCH - Knee Extension, Prone  Lie on your stomach on a firm surface, such as a bed or countertop. Place your right / left knee and leg just beyond the edge of the surface. You may  wish to place a towel under the far end of your right / left thigh for comfort.  Relax your leg muscles and allow gravity to straighten your knee. Your clinician may advise you to add an ankle weight if more resistance is helpful for you.  You should feel a stretch in the back of your right / left knee. Hold this position for __________ seconds. Repeat __________ times. Complete this stretch __________ times per day. * Your physician, physical therapist or athletic trainer may ask you to add ankle weight to enhance your stretch.  RANGE OF MOTION - Knee Flexion, Active  Lie on your back with both knees straight. (If this causes back discomfort, bend your opposite knee, placing your foot flat on the floor.)  Slowly slide your heel back toward your buttocks until you feel a gentle stretch in the front of your knee or thigh.  Hold for __________ seconds. Slowly slide your heel back to the starting position. Repeat __________ times. Complete this exercise __________ times per day.  STRETCH - Quadriceps, Prone   Lie on your stomach on a firm surface, such as a bed or padded floor.  Bend your right / left knee and grasp your ankle. If you are unable to reach, your ankle or pant leg, use a belt around your foot to lengthen your reach.  Gently pull your heel toward your buttocks. Your knee should not slide out to the side. You should feel a stretch in the front of your thigh and/or knee.  Hold this position for __________ seconds. Repeat __________  times. Complete this stretch __________ times per day.  STRETCH  Hamstrings, Supine   Lie on your back. Loop a belt or towel over the ball of your right / left foot.  Straighten your right / left knee and slowly pull on the belt to raise your leg. Do not allow the right / left knee to bend. Keep your opposite leg flat on the floor.  Raise the leg until you feel a gentle stretch behind your right / left knee or thigh. Hold this position for __________  seconds. Repeat __________ times. Complete this stretch __________ times per day.  STRENGTHENING EXERCISES These exercises may help you when beginning to rehabilitate your injury. They may resolve your symptoms with or without further involvement from your physician, physical therapist or athletic trainer. While completing these exercises, remember:   Muscles can gain both the endurance and the strength needed for everyday activities through controlled exercises.  Complete these exercises as instructed by your physician, physical therapist or athletic trainer. Progress the resistance and repetitions only as guided.  You may experience muscle soreness or fatigue, but the pain or discomfort you are trying to eliminate should never worsen during these exercises. If this pain does worsen, stop and make certain you are following the directions exactly. If the pain is still present after adjustments, discontinue the exercise until you can discuss the trouble with your clinician. STRENGTH - Quadriceps, Isometrics  Lie on your back with your right / left leg extended and your opposite knee bent.  Gradually tense the muscles in the front of your right / left thigh. You should see either your knee cap slide up toward your hip or increased dimpling just above the knee. This motion will push the back of the knee down toward the floor/mat/bed on which you are lying.  Hold the muscle as tight as you can without increasing your pain for __________ seconds.  Relax the muscles slowly and completely in between each repetition. Repeat __________ times. Complete this exercise __________ times per day.  STRENGTH - Quadriceps, Short Arcs   Lie on your back. Place a __________ inch towel roll under your knee so that the knee slightly bends.  Raise only your lower leg by tightening the muscles in the front of your thigh. Do not allow your thigh to rise.  Hold this position for __________ seconds. Repeat  __________ times. Complete this exercise __________ times per day.  OPTIONAL ANKLE WEIGHTS: Begin with ____________________, but DO NOT exceed ____________________. Increase in 1 pound/0.5 kilogram increments.  STRENGTH - Quadriceps, Straight Leg Raises  Quality counts! Watch for signs that the quadriceps muscle is working to insure you are strengthening the correct muscles and not "cheating" by substituting with healthier muscles.  Lay on your back with your right / left leg extended and your opposite knee bent.  Tense the muscles in the front of your right / left thigh. You should see either your knee cap slide up or increased dimpling just above the knee. Your thigh may even quiver.  Tighten these muscles even more and raise your leg 4 to 6 inches off the floor. Hold for __________ seconds.  Keeping these muscles tense, lower your leg.  Relax the muscles slowly and completely in between each repetition. Repeat __________ times. Complete this exercise __________ times per day.  STRENGTH - Hamstring, Curls  Lay on your stomach with your legs extended. (If you lay on a bed, your feet may hang over the edge.)  Tighten the  muscles in the back of your thigh to bend your right / left knee up to 90 degrees. Keep your hips flat on the bed/floor.  Hold this position for __________ seconds.  Slowly lower your leg back to the starting position. Repeat __________ times. Complete this exercise __________ times per day.  OPTIONAL ANKLE WEIGHTS: Begin with ____________________, but DO NOT exceed ____________________. Increase in 1 pound/0.5 kilogram increments.  STRENGTH  Quadriceps, Squats  Stand in a door frame so that your feet and knees are in line with the frame.  Use your hands for balance, not support, on the frame.  Slowly lower your weight, bending at the hips and knees. Keep your lower legs upright so that they are parallel with the door frame. Squat only within the range that does not  increase your knee pain. Never let your hips drop below your knees.  Slowly return upright, pushing with your legs, not pulling with your hands. Repeat __________ times. Complete this exercise __________ times per day.  STRENGTH - Quadriceps, Wall Slides  Follow guidelines for form closely. Increased knee pain often results from poorly placed feet or knees.  Lean against a smooth wall or door and walk your feet out 18-24 inches. Place your feet hip-width apart.  Slowly slide down the wall or door until your knees bend __________ degrees.* Keep your knees over your heels, not your toes, and in line with your hips, not falling to either side.  Hold for __________ seconds. Stand up to rest for __________ seconds in between each repetition. Repeat __________ times. Complete this exercise __________ times per day. * Your physician, physical therapist or athletic trainer will alter this angle based on your symptoms and progress. Document Released: 12/16/2004 Document Revised: 04/26/2011 Document Reviewed: 05/16/2008 [facility] Patient Information 2014 [place], [place].

## 2013-06-06 NOTE — ED Provider Notes (Signed)
Medical screening examination/treatment/procedure(s) were performed by non-physician practitioner and as supervising physician I was immediately available for consultation/collaboration.   EKG Interpretation None        [name], MD 06/06/13 1529

## 2013-07-19 ENCOUNTER — Ambulatory Visit: Payer: BC Managed Care – PPO | Admitting: Physical Therapy

## 2013-07-24 ENCOUNTER — Ambulatory Visit: Payer: BC Managed Care – PPO | Attending: Sports Medicine | Admitting: Physical Therapy

## 2013-08-03 ENCOUNTER — Ambulatory Visit (INDEPENDENT_AMBULATORY_CARE_PROVIDER_SITE_OTHER): Payer: BC Managed Care – PPO | Admitting: Obstetrics and Gynecology

## 2013-08-03 ENCOUNTER — Encounter: Payer: Self-pay | Admitting: Obstetrics and Gynecology

## 2013-08-03 VITALS — BP 100/80 | HR 76 | Resp 18 | Ht 63.0 in | Wt 162.0 lb

## 2013-08-03 DIAGNOSIS — Z01419 Encounter for gynecological examination (general) (routine) without abnormal findings: Secondary | ICD-10-CM

## 2013-08-03 DIAGNOSIS — Z309 Encounter for contraceptive management, unspecified: Secondary | ICD-10-CM

## 2013-08-03 DIAGNOSIS — Z113 Encounter for screening for infections with a predominantly sexual mode of transmission: Secondary | ICD-10-CM

## 2013-08-03 NOTE — Progress Notes (Signed)
GYNECOLOGY VISIT  PCP: Patient searching for new PCP  Referring provider:   HPI: 28 y.o.   Divorced  [place] American  female   [phone] with No LMP recorded. Patient is not currently having periods (Reason: Oral contraceptives).   here for   Gynecological Annual Exam Wants STD check.  Wants no cycle and no more pregnancies.  Menses are 8 days and are heavy. Has used Seasonale and Seasonique since 2013 and like the amenorrhea.  Used Depo Provera in past and and had breakthrough bleeding.  Has history of HSV I and II.   Hgb:   Urine:    GYNECOLOGIC HISTORY: No LMP recorded. Patient is not currently having periods (Reason: Oral contraceptives). Sexually active:  No Partner preference: Female Contraception: Seasonique  Menopausal hormone therapy: No DES exposure: No   Blood transfusions: No   Sexually transmitted diseases: yes, HSV I & II , HPV +   GYN procedures and prior surgeries:  No Last mammogram: Never                 Last pap and high risk HPV testing: 06/2012 - Normal - Per pt History of abnormal pap smear:  Yes, 2005   OB History   Grav Para Term Preterm Abortions TAB SAB Ect Mult Living   3 2   1 1    2        LIFESTYLE: Exercise: yes, Gym and walk x everyday              Tobacco: Former  Alcohol: No Drug use:  No  OTHER HEALTH MAINTENANCE: Tetanus/TDap: 2012 Per pt Gardisil: No Influenza:  No Zostavax: No  Bone density: Never Colonoscopy: Never  Cholesterol check: 2013  Family History  Problem Relation Age of Onset  . Hypertension Father     Patient Active Problem List   Diagnosis Date Noted  . Menorrhagia 03/29/2013  . General counseling for prescription of oral contraceptives 03/29/2013  . Abdominal hernia 09/29/2011   Past Medical History  Diagnosis Date  . Anemia   . Hyperlipidemia     postpartum 2+yrs ago  . Hypertension     postpartum 2+yrs ago  . History of bronchitis 3+yrs ago  . History of migraine 09/20/11-last one  . GERD  (gastroesophageal reflux disease)     only takes OTC meds  . Sickle cell trait   . Anxiety   . Depression     but doesn't require meds    Past Surgical History  Procedure Laterality Date  . Ovarian cyst removal  2012  . Eye surgery  1990's  . Ventral hernia repair  09/29/2011    Procedure: HERNIA REPAIR VENTRAL ADULT;  Surgeon: Joyice Faster. Cornett, MD;  Location: [facility];  Service: General;  Laterality: N/A;  . Abdominoplasty  09/29/2011    Procedure: ABDOMINOPLASTY;  Surgeon: Theodoro Kos, DO;  Location: [facility];  Service: Plastics;  Laterality: N/A;  ABDOMINOPLASTY FOR REPAIR OF RECTUS DIASTASIS    ALLERGIES: Percocet  Current Outpatient Prescriptions  Medication Sig Dispense Refill  . acetaminophen (TYLENOL) 500 MG tablet Take 1,000 mg by mouth every 6 (six) hours as needed. For pain      . aspirin-acetaminophen-caffeine (EXCEDRIN MIGRAINE) 250-250-65 MG per tablet Take 1 tablet by mouth daily as needed. For migraines      . Levonorgestrel-Ethinyl Estradiol (AMETHIA,CAMRESE) 0.15-0.03 &0.01 MG tablet Take 1 tablet by mouth daily.  1 Package  4   No current facility-administered medications for this visit.  ROS:  Pertinent items are noted in HPI.  SOCIAL HISTORY:    PHYSICAL EXAMINATION:    BP 100/80  Pulse 76  Resp 18  Ht 5\' 3"  (1.6 m)  Wt 162 lb (73.483 kg)  BMI 28.70 kg/m2   Wt Readings from Last 3 Encounters:  08/03/13 162 lb (73.483 kg)  06/03/13 157 lb (71.215 kg)  03/29/13 154 lb 9.6 oz (70.126 kg)     Ht Readings from Last 3 Encounters:  08/03/13 5\' 3"  (1.6 m)  06/03/13 5\' 2"  (1.575 m)  03/29/13 5\' 2"  (1.575 m)    General appearance: alert, cooperative and appears stated age Head: Normocephalic, without obvious abnormality, atraumatic Neck: no adenopathy, supple, symmetrical, trachea midline and thyroid not enlarged, symmetric, no tenderness/mass/nodules Lungs: clear to auscultation bilaterally Breasts: Inspection negative, No nipple retraction or  dimpling, No nipple discharge or bleeding, No axillary or supraclavicular adenopathy, Normal to palpation without dominant masses Heart: regular rate and rhythm Abdomen: abdominoplasty incisions, soft, non-tender; no masses,  no organomegaly Extremities: extremities normal, atraumatic, no cyanosis or edema Skin: Skin color, texture, turgor normal. No rashes or lesions Lymph nodes: Cervical, supraclavicular, and axillary nodes normal. No abnormal inguinal nodes palpated Neurologic: Grossly normal  Pelvic: External genitalia:  no lesions              Urethra:  normal appearing urethra with no masses, tenderness or lesions              Bartholins and Skenes: normal                 Vagina: normal appearing vagina with normal color and discharge, no lesions              Cervix: normal appearance              Pap and high risk HPV testing done: yes.            Bimanual Exam:  Uterus:  uterus is normal size, shape, consistency and nontender                                      Adnexa: normal adnexa in size, nontender and no masses                                      Rectovaginal: Confirms                                      Anus:  normal sphincter tone, no lesions  ASSESSMENT  Normal gynecologic exam. Desire for STD testing.  History of prior abnormal pap without treatment.  On continuous OCPs.  PLAN  Mammogram recommended yearly starting age 37.  Pap smear and high risk HPV testing performed.  Counseled on self breast exam. STD testing.  Discussion regarding Mirena IUD.  Risks and benefits reviewed.  Brochure also given.  Will precert.  Patient can withdraw from OCPs after taking for 3 consecutive weeks and then come in during first five days of cycle for IUD placement. Return annually or prn   An After Visit Summary was printed and given to the patient.

## 2013-08-03 NOTE — Patient Instructions (Signed)

## 2013-08-04 LAB — STD PANEL
HIV 1&2 Ab, 4th Generation: NONREACTIVE
Hepatitis B Surface Ag: NEGATIVE

## 2013-08-04 LAB — GC/CHLAMYDIA PROBE AMP, URINE
Chlamydia, Swab/Urine, PCR: NEGATIVE
GC Probe Amp, Urine: NEGATIVE

## 2013-08-04 LAB — HEPATITIS C ANTIBODY: HCV Ab: NEGATIVE

## 2013-08-07 LAB — IPS PAP TEST WITH HPV

## 2013-08-14 ENCOUNTER — Encounter: Payer: Self-pay | Admitting: Obstetrics and Gynecology

## 2013-08-20 ENCOUNTER — Telehealth: Payer: Self-pay | Admitting: Obstetrics and Gynecology

## 2013-08-20 NOTE — Telephone Encounter (Signed)
Spoke with patient. Advised that per benefits quote received, IUD and insertion is covered at 100%. There will be 0 patient liability. Patient is to call within the first 5 days of her cycle to schedule insertion. °

## 2013-08-20 NOTE — Telephone Encounter (Signed)
Left message for patient to call back. Need to go over IUD benefits °

## 2013-09-10 ENCOUNTER — Other Ambulatory Visit: Payer: Self-pay | Admitting: Obstetrics and Gynecology

## 2013-09-10 ENCOUNTER — Telehealth: Payer: Self-pay | Admitting: Obstetrics and Gynecology

## 2013-09-10 DIAGNOSIS — R1031 Right lower quadrant pain: Secondary | ICD-10-CM

## 2013-09-10 NOTE — Telephone Encounter (Signed)
Patient is requesting to have an ultrasound with lab work.

## 2013-09-10 NOTE — Telephone Encounter (Signed)
Spoke with patient. Patient states that she would like to come in to have T3 and T4 levels checked. She also states that since March she has been experiencing "dull, aching, constant pain in my lower abdomin and sharp pain that comes and goes on the right side." Patient states that she has had cysts removed 2-3 years ago and that the pain feels the same as it did before. Requesting to come in for an ultrasound for evaluation. Patient is currently on continuous OCP without cycles. Advised would send a message over to [place] regarding request and give patient a call back with further recommendations and instructions.  Dr.Silva, okay to place order for T3 and T4 lab draw as well as PUS? Please advise.

## 2013-09-10 NOTE — Telephone Encounter (Signed)
Spoke with patient. Advised of message as seen below from [place]. Patient agreeable. Ultrasound appointment scheduled for Thursday at 11am with 11:30am consult with Dr.Silva. Patient agreeable to date and time. Order still needs to be precerted. Patient aware will have labs drawn at that appointment.  Cc: Felipa Emory for precert  Routing to provider for final review. Patient agreeable to disposition. Will close encounter ;

## 2013-09-10 NOTE — Telephone Encounter (Signed)
I will place an order for a pelvic ultrasound to be precerted.  We can do her labs on the same day.

## 2013-09-11 NOTE — Telephone Encounter (Signed)
Spoke with patient. Advised that she will be responsible for $30 copay when she comes in for PUS. Patient questioned whether or not her Medicaid would cover her copay. I advised that I do not show Medicaid coverage on file and that she can bring that information in with her to be verified, but that until that coverage was verified, that she is still expected to present at $30 copay at PUS visit.

## 2013-09-13 ENCOUNTER — Encounter: Payer: Self-pay | Admitting: Obstetrics and Gynecology

## 2013-09-13 ENCOUNTER — Ambulatory Visit (INDEPENDENT_AMBULATORY_CARE_PROVIDER_SITE_OTHER): Payer: Medicaid Other | Admitting: Obstetrics and Gynecology

## 2013-09-13 ENCOUNTER — Ambulatory Visit (INDEPENDENT_AMBULATORY_CARE_PROVIDER_SITE_OTHER): Payer: BC Managed Care – PPO

## 2013-09-13 VITALS — BP 120/82 | HR 68 | Wt 162.0 lb

## 2013-09-13 DIAGNOSIS — R6889 Other general symptoms and signs: Secondary | ICD-10-CM | POA: Diagnosis not present

## 2013-09-13 DIAGNOSIS — R1031 Right lower quadrant pain: Secondary | ICD-10-CM

## 2013-09-13 DIAGNOSIS — N920 Excessive and frequent menstruation with regular cycle: Secondary | ICD-10-CM | POA: Diagnosis not present

## 2013-09-13 DIAGNOSIS — N921 Excessive and frequent menstruation with irregular cycle: Secondary | ICD-10-CM

## 2013-09-13 NOTE — Progress Notes (Signed)
Subjective  LMP - second week in May 2015. ( second month of the pack of Seasonale.) Patient is here for pelvic ultrasound due to RLQ sharp pain last week and dull cramping. Fairly constant.  Nothing makes it better or worse.  Notices it when driving or sits still.  Can wake her up.  Constant over lower abdomen.  No dysura.  No change in bowel function.   Patient is on Seasonale. No missed doses.   Not sexually active since February 2015.  STD testing in June was negative.   Hesitant to use Mirena.  Not sexually active.   Wants control of her cycles - pain and bleeding.  Bleeds through sheets when she gets her cycle.   Declines future childbearing.  History of pre-eclampsia twice.  Does not want to experience this again.   Has heat intolerance.  Family history of both hypo and hyperthyroidism.   Objective  Pelvic ultrasound - images and report reviewed with patient.   Uterus with no masses.  Endometrium with fluid collection 17 x 7 mm, hematometria vs. Other etiology.  EMS 5.81 mm.  Ovaries normal. Fluid collection around right ovary.     Assessment   Menorrhagia with irregular cycles on Seasonale.  Possible hematometria.  History of pre-eclampsia.  Family history of thyroid dysfunction.   Plan  Discussed options for care including Mirena IUD, endometrial ablation with tubal ligation, and hysterectomy.  Will pursue Mirena IUD after discussing options.  Already has information about this.  Will first do an endometrial biopsy.  Will precert this. Continue Seasonale until MIrena IUD is placed.  Will check TFTs today.   25 minutes face to face time of which over 50% was spent in counseling.   After visit summary to patient.

## 2013-09-13 NOTE — Progress Notes (Signed)
Patient to lab. Name and dob confirmed. Lab test-TSH ordered by Dr. Quincy Simmonds drawn x 2 attempts to [facility] with 22 g butterfly needle.  Sterile 2x2 applied and secured with Coban. Advised patient to keep in place for one hour.Tolerated without issue and denies complaints. Ambulatory to check out.  Geraldine Contras

## 2013-09-14 LAB — THYROID PANEL WITH TSH
Free Thyroxine Index: 3.3 (ref 1.0–3.9)
T3 Uptake: 30.3 % (ref 22.5–37.0)
T4, Total: 10.8 ug/dL (ref 5.0–12.5)
TSH: 1.169 u[IU]/mL (ref 0.350–4.500)

## 2013-09-25 ENCOUNTER — Telehealth: Payer: Self-pay | Admitting: Obstetrics and Gynecology

## 2013-09-25 NOTE — Telephone Encounter (Signed)
Left message for patient to call back. Need to go over benefits and schedule EMB. °

## 2013-09-26 NOTE — Telephone Encounter (Signed)
Patient notified of message from Dr. Quincy Simmonds, will continue with appointment on Friday morning.   Routing to provider for final review. Patient agreeable to disposition. Will close encounter

## 2013-09-26 NOTE — Telephone Encounter (Signed)
Dr. Quincy Simmonds, Patient is scheduled for endometrial biopsy 09/28/13 at 0900. Currently on Seasonale and not sexually active for many months per patient.  Okay to keep procedure appointment for patient with this current date or needs to have a cycle first?

## 2013-09-26 NOTE — Telephone Encounter (Signed)
Spoke with patient. Advised that per benefit quote received, she will be responsible for $30 copay when she comes in for EMB. Passed call to triage for instructions regarding scheduling.

## 2013-09-26 NOTE — Telephone Encounter (Signed)
Patient returning call to sabrina

## 2013-09-26 NOTE — Telephone Encounter (Signed)
Will do UPT the day of procedure.

## 2013-09-28 ENCOUNTER — Encounter: Payer: Self-pay | Admitting: Obstetrics and Gynecology

## 2013-09-28 ENCOUNTER — Ambulatory Visit (INDEPENDENT_AMBULATORY_CARE_PROVIDER_SITE_OTHER): Payer: BC Managed Care – PPO | Admitting: Obstetrics and Gynecology

## 2013-09-28 VITALS — BP 128/80 | HR 90 | Resp 20 | Ht 63.0 in | Wt 164.8 lb

## 2013-09-28 DIAGNOSIS — N921 Excessive and frequent menstruation with irregular cycle: Secondary | ICD-10-CM

## 2013-09-28 DIAGNOSIS — N920 Excessive and frequent menstruation with regular cycle: Secondary | ICD-10-CM

## 2013-09-28 DIAGNOSIS — N859 Noninflammatory disorder of uterus, unspecified: Secondary | ICD-10-CM

## 2013-09-28 NOTE — Progress Notes (Signed)
Patient ID: Toni Warren, female   DOB: 04-Sep-1985, 28 y.o.   MRN: [id number]  GYNECOLOGY  VISIT   HPI: 28 y.o.   Single  African American  female   [phone] with No LMP recorded. Patient is not currently having periods (Reason: Oral contraceptives).   here for  Endometrial biopsy.   Patient with menorrhagia with irregular bleeding.  Stains clothing.  Still some RLQ pain.  GC/CT negative on 08/03/13.  On Seasonale.  UPT today negative.   09/13/13 ultrasound in office -  Uterus with no masses.  Endometrium with fluid collection 17 x 7 mm, hematometria vs. Other etiology.  EMS 5.81 mm.  Ovaries normal.  Fluid collection around right ovary.   GYNECOLOGIC HISTORY: No LMP recorded. Patient is not currently having periods (Reason: Oral contraceptives). Contraception: OCP's--Seasonale   Menopausal hormone therapy: n/a        OB History   Grav Para Term Preterm Abortions TAB SAB Ect Mult Living   3 2   1 1    2          Patient Active Problem List   Diagnosis Date Noted  . Menorrhagia 03/29/2013  . General counseling for prescription of oral contraceptives 03/29/2013  . Abdominal hernia 09/29/2011    Past Medical History  Diagnosis Date  . Anemia   . Hyperlipidemia     postpartum 2+yrs ago  . Hypertension     postpartum 2+yrs ago  . History of bronchitis 3+yrs ago  . History of migraine 09/20/11-last one  . GERD (gastroesophageal reflux disease)     only takes OTC meds  . Sickle cell trait   . Anxiety   . Depression     but doesn't require meds  . 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 HSV-1 (herpes simplex virus 1) infection   . HSV-2 (herpes simplex virus 2) infection     Past Surgical History  Procedure Laterality Date  . Ovarian cyst removal  2012  . Eye surgery  1990's  . Ventral hernia repair  09/29/2011    Procedure: HERNIA REPAIR VENTRAL ADULT;  Surgeon: Joyice Faster. Cornett, MD;  Location: [facility];  Service: General;  Laterality: N/A;  . Abdominoplasty   09/29/2011    Procedure: ABDOMINOPLASTY;  Surgeon: Theodoro Kos, DO;  Location: [facility];  Service: Plastics;  Laterality: N/A;  ABDOMINOPLASTY FOR REPAIR OF RECTUS DIASTASIS    Current Outpatient Prescriptions  Medication Sig Dispense Refill  . acetaminophen (TYLENOL) 500 MG tablet Take 1,000 mg by mouth every 6 (six) hours as needed. For pain      . aspirin-acetaminophen-caffeine (EXCEDRIN MIGRAINE) 250-250-65 MG per tablet Take 1 tablet by mouth daily as needed. For migraines      . Levonorgestrel-Ethinyl Estradiol (AMETHIA,CAMRESE) 0.15-0.03 &0.01 MG tablet Take 1 tablet by mouth daily.  1 Package  4   No current facility-administered medications for this visit.     ALLERGIES: Percocet  Family History  Problem Relation Age of Onset  . Hypertension Father     History   Social History  . Marital Status: Single    Spouse Name: N/A    Number of Children: N/A  . Years of Education: N/A   Occupational History  . Not on file.   Social History Main Topics  . Smoking status: Former Research scientist (life sciences)  . Smokeless tobacco: Never Used  . Alcohol Use: No  . Drug Use: No  . Sexual Activity: Not Currently  Partners: Male    Birth Control/ Protection: Pill     Comment: Seasonale   Other Topics Concern  . Not on file   Social History Narrative  . No narrative on file    ROS:  Pertinent items are noted in HPI.  PHYSICAL EXAMINATION:    BP 128/80  Pulse 90  Resp 20  Ht 5\' 3"  (1.6 m)  Wt 164 lb 12.8 oz (74.753 kg)  BMI 29.20 kg/m2     General appearance: alert, cooperative and appears stated age   Procedure - endometrial biopsy Consent performed. Speculum place in vagina.  Sterile prep of cervix with betadine.  Cervix patulous. Pipelle placed to   7       cm without difficulty twice. Tissue obtained and sent to pathology. Speculum removed.  No complications.  ASSESSMENT  Menometrorrhagia.  Fluid in endometrial canal.   ON Seasonale. RLQ pain.  Negative  GC/CT.  PLAN  Precautions given.  Follow up endometrial biopsy. Plan to follow.   An After Visit Summary was printed and given to the patient.  __10____ minutes face to face time of which over 50% was spent in counseling.

## 2013-09-28 NOTE — Patient Instructions (Signed)
Endometrial Biopsy, Care After Refer to this sheet in the next few weeks. These instructions provide you with information on caring for yourself after your procedure. Your health care provider may also give you more specific instructions. Your treatment has been planned according to current medical practices, but problems sometimes occur. Call your health care provider if you have any problems or questions after your procedure. WHAT TO EXPECT AFTER THE PROCEDURE After your procedure, it is typical to have the following:  You may have mild cramping and a small amount of vaginal bleeding for a few days after the procedure. This is normal. HOME CARE INSTRUCTIONS  Only take over-the-counter or prescription medicine as directed by your health care provider.  Do not douche, use tampons, or have sexual intercourse until your health care provider approves.  Follow your health care provider's instructions regarding any activity restrictions, such as strenuous exercise or heavy lifting. SEEK MEDICAL CARE IF:  You have heavy bleeding or bleeding longer than 2 days after the procedure.  You have bad smelling drainage from your vagina.  You have a fever and chills.  Youhave severe lower stomach (abdominal) pain. SEEK IMMEDIATE MEDICAL CARE IF:  You have severe cramps in your stomach or back.  You pass large blood clots.  Your bleeding increases.  You become weak or lightheaded, or you pass out. Document Released: 11/22/2012 Document Reviewed: 11/22/2012 ExitCare Patient Information 2015 [employer]. This information is not intended to replace advice given to you by your health care provider. Make sure you discuss any questions you have with your health care provider.  

## 2013-10-02 LAB — IPS OTHER TISSUE BIOPSY

## 2013-10-04 ENCOUNTER — Telehealth: Payer: Self-pay

## 2013-10-04 NOTE — Telephone Encounter (Signed)
Message copied by Lowella Fairy on Thu Oct 04, 2013  1:21 PM ------      Message from: [place], [place]: Wed Oct 03, 2013  5:51 PM       Please inform patient of normal endometrial biopsy.       She may proceed with the Mirena IUD to help control her bleeding.      I believe this is already precerted.       We will need to do a urine pregnancy test the day of the insertion.       Don't miss any birth control pills! ------

## 2013-10-04 NOTE — Telephone Encounter (Signed)
Called pt to make appt for Mirena IUD insertion.  Per Dr. Quincy Simmonds, pt. Doesn't have to wait until begins cycle since she is taking Seasonale continuous pills.  Will inform pt needs to continue taking pills until the day we insert IUD and have scheduled appt for 10-24-13 at 9:00.  LMOVM at [phone] for pt to call me.

## 2013-10-05 ENCOUNTER — Telehealth: Payer: Self-pay

## 2013-10-05 NOTE — Telephone Encounter (Signed)
Per Dr. Quincy Simmonds patient needs to continue on Seasonale and we will make appt for insertion of Mirena IUD in next few weeks while taking pill(do not stop)  She will need to have urine UPT day of insertion.  Made appt for Mirena IUD insertion on 10-24-13 at 9:00.  Called pt and LMOVM to call me to discuss.

## 2013-10-05 NOTE — Telephone Encounter (Signed)
Message copied by Lowella Fairy on Fri Oct 05, 2013  8:48 AM ------      Message from: [place], BROOK E      Created: Wed Oct 03, 2013  5:51 PM       Please inform patient of normal endometrial biopsy.       She may proceed with the Mirena IUD to help control her bleeding.      I believe this is already precerted.       We will need to do a urine pregnancy test the day of the insertion.       Don't miss any birth control pills! ------

## 2013-10-08 ENCOUNTER — Telehealth: Payer: Self-pay

## 2013-10-08 NOTE — Telephone Encounter (Signed)
Message copied by Lowella Fairy on Mon Oct 08, 2013  8:50 AM ------      Message from: [place], BROOK E      Created: Wed Oct 03, 2013  5:51 PM       Please inform patient of normal endometrial biopsy.       She may proceed with the Mirena IUD to help control her bleeding.      I believe this is already precerted.       We will need to do a urine pregnancy test the day of the insertion.       Don't miss any birth control pills! ------

## 2013-10-08 NOTE — Telephone Encounter (Signed)
See telephone note of 10-04-13.

## 2013-10-08 NOTE — Telephone Encounter (Signed)
See note of 10-04-13.

## 2013-10-08 NOTE — Telephone Encounter (Signed)
LMOVM to call me to discuss when to come in for Mirena IUD insertion.

## 2013-10-08 NOTE — Telephone Encounter (Signed)
Patient notified on 10-04-13 of normal endometrial biopsy.  Advised made appt for Mirena IUD insertion for 10-24-13 9:00am.  Advised pt needs to take 800mg  of Ibuprofen 1 hr prior to appt.

## 2013-10-15 ENCOUNTER — Telehealth: Payer: Self-pay | Admitting: Obstetrics and Gynecology

## 2013-10-15 NOTE — Telephone Encounter (Signed)
Returning a call to Amanda.

## 2013-10-15 NOTE — Telephone Encounter (Signed)
Left message to call Kaitlyn at [phone]. 

## 2013-10-15 NOTE — Telephone Encounter (Signed)
Spoke with patient. Patient states that she would like to reschedule IUD insertion appointment to 9/16 as she will have that day off. Appointment scheduled for 9/16 at 3pm with Dr.Silva. Patient agreeable to date and time. Patient is on seasonale and was advised not to miss any pills. Will need UPT per Dr.Silva day of appointment.  Routing to provider for final review. Patient agreeable to disposition. Will close encounter

## 2013-10-24 ENCOUNTER — Ambulatory Visit: Payer: BC Managed Care – PPO | Admitting: Obstetrics and Gynecology

## 2013-10-29 ENCOUNTER — Ambulatory Visit: Payer: BC Managed Care – PPO | Admitting: Family Medicine

## 2013-10-31 ENCOUNTER — Encounter: Payer: Self-pay | Admitting: Obstetrics and Gynecology

## 2013-10-31 ENCOUNTER — Ambulatory Visit (INDEPENDENT_AMBULATORY_CARE_PROVIDER_SITE_OTHER): Payer: BC Managed Care – PPO | Admitting: Obstetrics and Gynecology

## 2013-10-31 VITALS — BP 120/78 | HR 88 | Ht 63.0 in | Wt 167.8 lb

## 2013-10-31 DIAGNOSIS — Z309 Encounter for contraceptive management, unspecified: Secondary | ICD-10-CM

## 2013-10-31 DIAGNOSIS — Z30430 Encounter for insertion of intrauterine contraceptive device: Secondary | ICD-10-CM

## 2013-10-31 LAB — POCT URINE PREGNANCY: Preg Test, Ur: NEGATIVE

## 2013-10-31 NOTE — Patient Instructions (Signed)

## 2013-10-31 NOTE — Progress Notes (Signed)
Patient ID: Toni Warren, female   DOB: 08/10/1985, 28 y.o.   MRN: [id number] GYNECOLOGY  VISIT   HPI: 28 y.o.   Single  African American  female   [phone] with No LMP recorded. Patient is not currently having periods (Reason: Oral contraceptives).   here for Mirena IUD insertion. Patient with history of menorrhagia.  Had ultrasound showing fluid in endometrial canal, otherwise unremarkable.  EMB benign.   Took Excedrin this am.   Urine UPT: Negative  GYNECOLOGIC HISTORY: No LMP recorded. Patient is not currently having periods (Reason: Oral contraceptives). Contraception: G. Seasonale  Menopausal hormone therapy: N/A        OB History   Grav Para Term Preterm Abortions TAB SAB Ect Mult Living   3 2   1 1    2          Patient Active Problem List   Diagnosis Date Noted  . Menorrhagia 03/29/2013  . General counseling for prescription of oral contraceptives 03/29/2013  . Abdominal hernia 09/29/2011    Past Medical History  Diagnosis Date  . Anemia   . Hyperlipidemia     postpartum 2+yrs ago  . Hypertension     postpartum 2+yrs ago  . History of bronchitis 3+yrs ago  . History of migraine 09/20/11-last one  . GERD (gastroesophageal reflux disease)     only takes OTC meds  . Sickle cell trait   . Anxiety   . Depression     but doesn't require meds  . 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 HSV-1 (herpes simplex virus 1) infection   . HSV-2 (herpes simplex virus 2) infection     Past Surgical History  Procedure Laterality Date  . Ovarian cyst removal  2012  . Eye surgery  1990's  . Ventral hernia repair  09/29/2011    Procedure: HERNIA REPAIR VENTRAL ADULT;  Surgeon: Joyice Faster. Cornett, MD;  Location: [facility];  Service: General;  Laterality: N/A;  . Abdominoplasty  09/29/2011    Procedure: ABDOMINOPLASTY;  Surgeon: Theodoro Kos, DO;  Location: [facility];  Service: Plastics;  Laterality: N/A;  ABDOMINOPLASTY FOR REPAIR OF RECTUS DIASTASIS    Current Outpatient  Prescriptions  Medication Sig Dispense Refill  . acetaminophen (TYLENOL) 500 MG tablet Take 1,000 mg by mouth every 6 (six) hours as needed. For pain      . aspirin-acetaminophen-caffeine (EXCEDRIN MIGRAINE) 250-250-65 MG per tablet Take 1 tablet by mouth daily as needed. For migraines      . Levonorgestrel-Ethinyl Estradiol (AMETHIA,CAMRESE) 0.15-0.03 &0.01 MG tablet Take 1 tablet by mouth daily.  1 Package  4   No current facility-administered medications for this visit.     ALLERGIES: Percocet  Family History  Problem Relation Age of Onset  . Hypertension Father     History   Social History  . Marital Status: Single    Spouse Name: N/A    Number of Children: N/A  . Years of Education: N/A   Occupational History  . Not on file.   Social History Main Topics  . Smoking status: Former Research scientist (life sciences)  . Smokeless tobacco: Never Used  . Alcohol Use: No  . Drug Use: No  . Sexual Activity: Not Currently    Partners: Male    Birth Control/ Protection: Pill     Comment: Seasonale   Other Topics Concern  . Not on file   Social History Narrative  . No narrative on file  ROS:  Pertinent items are noted in HPI.  PHYSICAL EXAMINATION:    BP 120/78  Pulse 88  Ht 5\' 3"  (1.6 m)  Wt 167 lb 12.8 oz (76.114 kg)  BMI 29.73 kg/m2     General appearance: alert, cooperative and appears stated age   Pelvic: External genitalia:  no lesions              Urethra:  normal appearing urethra with no masses, tenderness or lesions              Bartholins and Skenes: normal                 Vagina: normal appearing vagina with normal color and discharge, no lesions              Cervix: normal appearance, patulous.  First degree uterine prolapse.                    Bimanual Exam:  Uterus:  uterus is normal size, shape, consistency and nontender                                      Adnexa: normal adnexa in size, nontender and no masses                                    Procedure - Mirena  IUD insertion - Lot      [id number]                        , Expiration 8/17 Consent for procedure. Speculum placed.  Sterile prep with Hibiclens.  Tenaculum to anterior cervical lip.  Uterus sounded to 8 cm.  Mirena IUD placed without difficulty.  Strings trimmed.  No complications.  Minimal EBL.  ASSESSMENT  Mirena IUD insertion.   PLAN  Precautions given.  Return in 5 weeks for IUD check.    An After Visit Summary was printed and given to the patient.  __10____ minutes face to face time of which over 50% was spent in counseling.

## 2013-11-02 ENCOUNTER — Telehealth: Payer: Self-pay | Admitting: Obstetrics and Gynecology

## 2013-11-02 NOTE — Telephone Encounter (Signed)
Needs GYN evaluation if develops heavy bleeding or increasing lower abdominal pain.  Expect some cramping, maybe even up to two weeks with placement of the IUD.  Keep 5 week appointment and I will be happy to see her sooner if needed.

## 2013-11-02 NOTE — Telephone Encounter (Signed)
Spoke with patient. Patient states that she woke up this morning and her neck was "sore." Patient drank hot tea and began to have diarrhea around 12 pm and has used the bathroom 4 times since. "I feel tired and weak." Patient states she is having upper abdominal pain that is a 6/10. "It feels like a tummy ache." Denies sharp pain, fever, nausea, and vomiting. Advised patient that this sounds like it is GI related. Advised patient to drink plenty of fluids, eat a bland diet, and get plenty of rest. Advised if pain changes or worsens to give [place] a call back. Advised if begins to feel worse needs to see urgent care especially over the weekend. Patient is agreeable and verbalizes understanding. Advised patient would send a message to Dr.Silva and call back if any further recommendations. Patient is agreeable.

## 2013-11-02 NOTE — Telephone Encounter (Signed)
Patient calling to speak with nurse. She says she had an IUD placed and wants to make sure "nothing is wrong."

## 2013-11-02 NOTE — Telephone Encounter (Signed)
Spoke with patient. Advised of message as seen below from [place]. Patient is agreeable and verbalizes understanding. Will seek GYN care if any of these symptoms develop.  Routing to provider for final review. Patient agreeable to disposition. Will close encounter

## 2013-11-19 ENCOUNTER — Telehealth: Payer: Self-pay | Admitting: Obstetrics and Gynecology

## 2013-11-19 NOTE — Telephone Encounter (Signed)
Spoke with patient. Patient states that she has a "mole" on her "private area" that Dr.Silva has been before. Was advised by Dr.Silva if there are any changes with the mole to call in to schedule an appointment so that she can further evaluate the area. Patient states that she feels the mole is getting larger in size and would like for Dr.Silva to take a look at it. Appointment scheduled for 10/7 at 11am with Dr.Silva. Patient agreeable to date and time.  Routing to provider for final review. Patient agreeable to disposition. Will close encounter

## 2013-11-19 NOTE — Telephone Encounter (Signed)
Patient would like an appointment with Dr.Silva to evaluate a mole.

## 2013-11-21 ENCOUNTER — Ambulatory Visit (INDEPENDENT_AMBULATORY_CARE_PROVIDER_SITE_OTHER): Payer: BC Managed Care – PPO | Admitting: Obstetrics and Gynecology

## 2013-11-21 ENCOUNTER — Encounter: Payer: Self-pay | Admitting: Obstetrics and Gynecology

## 2013-11-21 VITALS — BP 120/80 | HR 70 | Ht 63.0 in | Wt 167.0 lb

## 2013-11-21 DIAGNOSIS — A630 Anogenital (venereal) warts: Secondary | ICD-10-CM | POA: Insufficient documentation

## 2013-11-21 NOTE — Progress Notes (Signed)
Patient ID: Toni Warren, female   DOB: Nov 06, 1985, 28 y.o.   MRN: [id number] GYNECOLOGY VISIT  PCP:   Referring provider:   HPI: 28 y.o.   Single  African American  female   [phone] with Patient's last menstrual period was 11/04/2013.   here for   Evaluation of left vulvar nevus.  Patient states she feels it is growing larger. History of condyloma from 2005.  None since then.  Had area on her bottom since this summer.  Has a tender bottom.  Loves the IUD so far.  Had a light menses.  Some cramping.    Hgb:   Urine:    GYNECOLOGIC HISTORY: Patient's last menstrual period was 11/04/2013. Sexually active:  no Partner preference: female Contraception: Mirena IUD--inserted 10-31-13  Menopausal hormone therapy: n/a DES exposure:  no  Blood transfusions: no   Sexually transmitted diseases:  HSV I & II, HPV Pos.  GYN procedures and prior surgeries:  no Last mammogram:  n/a              Last pap and high risk HPV testing:  08-03-13 wnl:neg HR HPV  History of abnormal pap smear:  2005   OB History   Grav Para Term Preterm Abortions TAB SAB Ect Mult Living   3 2   1 1    2        LIFESTYLE: Exercise:   Gym and walking          Tobacco:   former Alcohol:   no Drug use:  no  Patient Active Problem List   Diagnosis Date Noted  . Menorrhagia 03/29/2013  . General counseling for prescription of oral contraceptives 03/29/2013  . Abdominal hernia 09/29/2011    Past Medical History  Diagnosis Date  . Anemia   . Hyperlipidemia     postpartum 2+yrs ago  . Hypertension     postpartum 2+yrs ago  . History of bronchitis 3+yrs ago  . History of migraine 09/20/11-last one  . GERD (gastroesophageal reflux disease)     only takes OTC meds  . Sickle cell trait   . Anxiety   . Depression     but doesn't require meds  . 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 HSV-1 (herpes simplex virus 1) infection   . HSV-2 (herpes simplex virus 2) infection     Past Surgical History   Procedure Laterality Date  . Ovarian cyst removal  2012  . Eye surgery  1990's  . Ventral hernia repair  09/29/2011    Procedure: HERNIA REPAIR VENTRAL ADULT;  Surgeon: Joyice Faster. Cornett, MD;  Location: [facility];  Service: General;  Laterality: N/A;  . Abdominoplasty  09/29/2011    Procedure: ABDOMINOPLASTY;  Surgeon: Theodoro Kos, DO;  Location: [facility];  Service: Plastics;  Laterality: N/A;  ABDOMINOPLASTY FOR REPAIR OF RECTUS DIASTASIS    Current Outpatient Prescriptions  Medication Sig Dispense Refill  . acetaminophen (TYLENOL) 500 MG tablet Take 1,000 mg by mouth every 6 (six) hours as needed. For pain      . aspirin-acetaminophen-caffeine (EXCEDRIN MIGRAINE) 250-250-65 MG per tablet Take 1 tablet by mouth daily as needed. For migraines       No current facility-administered medications for this visit.     ALLERGIES: Percocet  Family History  Problem Relation Age of Onset  . Hypertension Father     History   Social History  . Marital Status: Single    Spouse Name: N/A  Number of Children: N/A  . Years of Education: N/A   Occupational History  . Not on file.   Social History Main Topics  . Smoking status: Former Research scientist (life sciences)  . Smokeless tobacco: Never Used  . Alcohol Use: No  . Drug Use: No  . Sexual Activity: Not Currently    Partners: Male    Birth Control/ Protection: IUD     Comment: Mirena IUD inserted 10-31-13   Other Topics Concern  . Not on file   Social History Narrative  . No narrative on file    ROS:  Pertinent items are noted in HPI.  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   Right labia majora inferiorly with 0.4 cm raised nevus - patient states this feels different and not like the other areas.  Has stinging there and feels it is due to an upcoming outbreak - so not treated today.   Procedure  Consent for cryotherapy to the lesions.  Cryo performed without complication to both lesions using the sponge sticks.  No complications.   ASSESSMENT  Condyloma of the vulva.  PLAN  Return for a recheck in 3 weeks for Mirena IUD and condlyloma recheck.   15 minutes face to face time of which over 50% was spent in counseling.   An After Visit Summary was printed and given to the patient.

## 2013-11-29 ENCOUNTER — Ambulatory Visit: Payer: BC Managed Care – PPO | Attending: Sports Medicine | Admitting: Physical Therapy

## 2013-11-29 ENCOUNTER — Telehealth: Payer: Self-pay | Admitting: Obstetrics and Gynecology

## 2013-11-29 DIAGNOSIS — M25561 Pain in right knee: Secondary | ICD-10-CM | POA: Diagnosis present

## 2013-11-29 NOTE — Telephone Encounter (Signed)
Dr. Quincy Simmonds,  Can you review and advise. Last visit patient treated for condyloma. Patient has follow up appointment with you 10/26 would you like to see her first for follow up before new rx?

## 2013-11-29 NOTE — Telephone Encounter (Signed)
Pt would like the ointment that was offered to her at last visit called into the [facility] on [address] at [phone].

## 2013-11-30 ENCOUNTER — Other Ambulatory Visit: Payer: Self-pay | Admitting: Obstetrics and Gynecology

## 2013-11-30 MED ORDER — IMIQUIMOD 5 % EX CREA: TOPICAL_CREAM | CUTANEOUS | Status: DC

## 2013-11-30 NOTE — Telephone Encounter (Signed)
Spoke with patient. She is given message from Dr. Quincy Simmonds.  She is agreeable to instructions and plan. She will keep upcoming appointment with Dr. Quincy Simmonds as well for 12/10/13.  Routing to provider for final review. Patient agreeable to disposition. Will close encounter

## 2013-11-30 NOTE — Telephone Encounter (Signed)
OK to [facility].  Use on areas at night three nights a week.  Wash off in am.  I will send in to patient's pharmacy.  She will need a 3 month recheck with me.  The [facility]ara can cause skin irritation.

## 2013-12-06 ENCOUNTER — Ambulatory Visit: Payer: BC Managed Care – PPO | Admitting: Physical Therapy

## 2013-12-07 ENCOUNTER — Ambulatory Visit: Payer: BC Managed Care – PPO | Admitting: Obstetrics and Gynecology

## 2013-12-10 ENCOUNTER — Encounter: Payer: Self-pay | Admitting: Obstetrics and Gynecology

## 2013-12-10 ENCOUNTER — Ambulatory Visit (INDEPENDENT_AMBULATORY_CARE_PROVIDER_SITE_OTHER): Payer: BC Managed Care – PPO | Admitting: Obstetrics and Gynecology

## 2013-12-10 VITALS — Ht 63.0 in

## 2013-12-10 DIAGNOSIS — A630 Anogenital (venereal) warts: Secondary | ICD-10-CM

## 2013-12-10 DIAGNOSIS — Z30431 Encounter for routine checking of intrauterine contraceptive device: Secondary | ICD-10-CM

## 2013-12-10 NOTE — Patient Instructions (Signed)
Genital Warts Genital warts are a sexually transmitted infection. They may appear as small bumps on the tissues of the genital area. CAUSES  Genital warts are caused by a virus called human papillomavirus (HPV). HPV is the most common sexually transmitted disease (STD) and infection of the sex organs. This infection is spread by having unprotected sex with an infected person. It can be spread by vaginal, anal, and oral sex. Many people do not know they are infected. They may be infected for years without problems. However, even if they do not have problems, they can unknowingly pass the infection to their sexual partners. SYMPTOMS   Itching and irritation in the genital area.  Warts that bleed.  Painful sexual intercourse. DIAGNOSIS  Warts are usually recognized with the naked eye on the vagina, vulva, perineum, anus, and rectum. Certain tests can also diagnose genital warts, such as:  A Pap test.  A tissue sample (biopsy) exam.  Colposcopy. A magnifying tool is used to examine the vagina and cervix. The HPV cells will change color when certain solutions are used. TREATMENT  Warts can be removed by:  Applying certain chemicals, such as cantharidin or podophyllin.  Liquid nitrogen freezing (cryotherapy).  Immunotherapy with Candida or Trichophyton injections.  Laser treatment.  Burning with an electrified probe (electrocautery).  Interferon injections.  Surgery. PREVENTION  HPV vaccination can help prevent HPV infections that cause genital warts and that cause cancer of the cervix. It is recommended that the vaccination be given to people between the ages 9 to 26 years old. The vaccine might not work as well or might not work at all if you already have HPV. It should not be given to pregnant women. HOME CARE INSTRUCTIONS   It is important to follow your caregiver's instructions. The warts will not go away without treatment. Repeat treatments are often needed to get rid of warts.  Even after it appears that the warts are gone, the normal tissue underneath often remains infected.  Do not try to treat genital warts with medicine used to treat hand warts. This type of medicine is strong and can burn the skin in the genital area, causing more damage.  Tell your past and current sexual partner(s) that you have genital warts. They may be infected also and need treatment.  Avoid sexual contact while being treated.  Do not touch or scratch the warts. The infection may spread to other parts of your body.  Women with genital warts should have a cervical cancer check (Pap test) at least once a year. This type of cancer is slow-growing and can be cured if found early. Chances of developing cervical cancer are increased with HPV.  Inform your obstetrician about your warts in the event of pregnancy. This virus can be passed to the baby's respiratory tract. Discuss this with your caregiver.  Use a condom during sexual intercourse. Following treatment, the use of condoms will help prevent reinfection.  Ask your caregiver about using over-the-counter anti-itch creams. SEEK MEDICAL CARE IF:   Your treated skin becomes red, swollen, or painful.  You have a fever.  You feel generally ill.  You feel little lumps in and around your genital area.  You are bleeding or have painful sexual intercourse. MAKE SURE YOU:   Understand these instructions.  Will watch your condition.  Will get help right away if you are not doing well or get worse. Document Released: 01/30/2000 Document Revised: 06/18/2013 Document Reviewed: 08/10/2010 Exit[employer] Patient Information 2015 [employer]. This   information is not intended to replace advice given to you by your health care provider. Make sure you discuss any questions you have with your health care provider.  

## 2013-12-10 NOTE — Progress Notes (Signed)
Patient ID: Toni Warren, female   DOB: 07-09-1985, 28 y.o.   MRN: [id number] GYNECOLOGY  VISIT   HPI: 28 y.o.   Single  African American  female   [phone] with Patient's last menstrual period was 11/04/2013.   here for follow up visit.  Status post cryotherapy of condyloma. Condyloma are now worse.  Used Aldara twice and did not see a difference.  Caused irritation.   Very happy with the Mirena IUD. Maybe had a menses.  Was able to check her strings.   GYNECOLOGIC HISTORY: Patient's last menstrual period was 11/04/2013. Contraception: Mirena IUD -- inserted 10-31-13 Menopausal hormone therapy: n/a        OB History   Grav Para Term Preterm Abortions TAB SAB Ect Mult Living   3 2   1 1    2          Patient Active Problem List   Diagnosis Date Noted  . 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 Menorrhagia 03/29/2013  . General counseling for prescription of oral contraceptives 03/29/2013  . Abdominal hernia 09/29/2011    Past Medical History  Diagnosis Date  . Anemia   . Hyperlipidemia     postpartum 2+yrs ago  . Hypertension     postpartum 2+yrs ago  . History of bronchitis 3+yrs ago  . History of migraine 09/20/11-last one  . GERD (gastroesophageal reflux disease)     only takes OTC meds  . Sickle cell trait   . Anxiety   . Depression     but doesn't require meds  . 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 HSV-1 (herpes simplex virus 1) infection   . HSV-2 (herpes simplex virus 2) infection   . Condyloma acuminatum of vulva     Past Surgical History  Procedure Laterality Date  . Ovarian cyst removal  2012  . Eye surgery  1990's  . Ventral hernia repair  09/29/2011    Procedure: HERNIA REPAIR VENTRAL ADULT;  Surgeon: Joyice Faster. Cornett, MD;  Location: [place];  Service: General;  Laterality: N/A;  . Abdominoplasty  09/29/2011    Procedure: ABDOMINOPLASTY;  Surgeon: Theodoro Kos, DO;  Location: [place];  Service: Plastics;  Laterality: N/A;   ABDOMINOPLASTY FOR REPAIR OF RECTUS DIASTASIS    Current Outpatient Prescriptions  Medication Sig Dispense Refill  . acetaminophen (TYLENOL) 500 MG tablet Take 1,000 mg by mouth every 6 (six) hours as needed. For pain      . aspirin-acetaminophen-caffeine (EXCEDRIN MIGRAINE) 250-250-65 MG per tablet Take 1 tablet by mouth daily as needed. For migraines      . imiquimod (ALDARA) 5 % cream Apply entire packet of cream to affected areas 3 times weekly.  Apply at night.  Wash off completely after 8 hours.  12 each  2  . levonorgestrel (MIRENA) 20 MCG/24HR IUD 1 each by Intrauterine route once.       No current facility-administered medications for this visit.     ALLERGIES: Percocet  Family History  Problem Relation Age of Onset  . Hypertension Father     History   Social History  . Marital Status: Single    Spouse Name: N/A    Number of Children: N/A  . Years of Education: N/A   Occupational History  . Not on file.   Social History Main Topics  . Smoking status: Former Research scientist (life sciences)  . Smokeless tobacco: Never Used  . Alcohol Use: No  . Drug Use:  No  . Sexual Activity: Not Currently    Partners: Male    Birth Control/ Protection: IUD     Comment: Mirena IUD inserted 10-31-13   Other Topics Concern  . Not on file   Social History Narrative  . No narrative on file    ROS:  Pertinent items are noted in HPI.  PHYSICAL EXAMINATION:    Ht 5\' 3"  (1.6 m)  LMP 11/04/2013     General appearance: alert, cooperative and appears stated age  Pelvic: External genitalia:   Condyloma 0.75 mm of right labia majora, 2 condyloma of the left labia majora 0.5 and 0.75 mm, 2 small 0.3 mm condyloma of right posterior forchette - all treated with TCA 80% after verbal consent.               Urethra:  normal appearing urethra with no masses, tenderness or lesions              Bartholins and Skenes: normal                 Vagina: normal appearing vagina with normal color and discharge, no  lesions              Cervix: normal appearance.  IUD strings seen.                   Bimanual Exam:  Uterus:  uterus is normal size, shape, consistency and nontender                                      Adnexa: normal adnexa in size, nontender and no masses                                    ASSESSMENT  Condyloma treated with TCA. Mirena IUD patient.   PLAN  Return prn and for annual exam.   An After Visit Summary was printed and given to the patient.  __15____ minutes face to face time of which over 50% was spent in counseling.

## 2013-12-11 ENCOUNTER — Ambulatory Visit: Payer: BC Managed Care – PPO | Admitting: Family Medicine

## 2013-12-17 ENCOUNTER — Encounter: Payer: Self-pay | Admitting: Obstetrics and Gynecology

## 2013-12-26 ENCOUNTER — Telehealth: Payer: Self-pay | Admitting: Obstetrics and Gynecology

## 2013-12-26 NOTE — Telephone Encounter (Signed)
[place] for appointment for tomorrow for a recheck of the condyloma.

## 2013-12-26 NOTE — Telephone Encounter (Signed)
Patient calling to schedule a recheck with Dr. Quincy Simmonds. Next appointment available is in early December and the patient states she needs to be seen before that. She is not sure if she should just see a nurse practitioner or has to see Dr. Quincy Simmonds. Please advise? I can return call to patient if necessary.

## 2013-12-26 NOTE — Telephone Encounter (Signed)
Return call to patient, LMTCB. Ask for triage. Last OV 12-10-13 with Dr Quincy Simmonds: return PRN.

## 2013-12-26 NOTE — Telephone Encounter (Signed)
Return call from patient. States medication dr Quincy Simmonds applies helped initially but now same area seems to be returning. Not painful but sensitive to touch/wiping. Appointment scheduled for tomorrow with Debbi at 2pm. Advised Dr Quincy Simmonds available to [facility] if needed and will review this call. If any additional instructions, I will call her back.   Routing to provider for final review. If acceptable, can close encounter.  CC: Debbi Hollice Espy CNM

## 2013-12-27 ENCOUNTER — Encounter: Payer: Self-pay | Admitting: Certified Nurse Midwife

## 2013-12-27 ENCOUNTER — Ambulatory Visit (INDEPENDENT_AMBULATORY_CARE_PROVIDER_SITE_OTHER): Payer: BC Managed Care – PPO | Admitting: Certified Nurse Midwife

## 2013-12-27 VITALS — BP 110/72 | HR 70 | Resp 16 | Ht 63.0 in | Wt 169.0 lb

## 2013-12-27 DIAGNOSIS — A630 Anogenital (venereal) warts: Secondary | ICD-10-CM

## 2013-12-27 NOTE — Progress Notes (Signed)
28 y.o. [id number]  Single african [place] female here with complaint of condyloma return after treatment on 12/10/13 with TCA. Patient had 4 area treated by Dr.Silva with good response per patient, but she has noticed 2 areas are still present. Request repeat treatment today if indicated. Sexually active. Contraception Mirena IUD. No STD concerns or partner change. Denies any vaginal symptoms. No other health issues today.  O:Healthy female WDWN Affect: normal, orientation x 3  Exam: Abdomen:soft., non tender Lymph node: no enlargement or tenderness Pelvic exam: External genital: condyloma noted on  Right and left vulva see diagram, no other areas noted. Per previous visit  3 have resolved.    A:Condyloma of the vulva previous treatment with good results. New condyloma noted with treatment requested.   P:Discussed findings of condyloma and treatment indicated. Risks/benefits discussed. Request TCA treatment.  Procedure: Ky jelly applied to healthy skin and TCA applied to condyloma with acetowhite effect noted. Patient tolerated procedure well. Instructions given. Recheck 3 weeks if needed.     Rv prn

## 2013-12-27 NOTE — Patient Instructions (Signed)
Genital Warts Genital warts are a sexually transmitted infection. They may appear as small bumps on the tissues of the genital area. CAUSES  Genital warts are caused by a virus called human papillomavirus (HPV). HPV is the most common sexually transmitted disease (STD) and infection of the sex organs. This infection is spread by having unprotected sex with an infected person. It can be spread by vaginal, anal, and oral sex. Many people do not know they are infected. They may be infected for years without problems. However, even if they do not have problems, they can unknowingly pass the infection to their sexual partners. SYMPTOMS   Itching and irritation in the genital area.  Warts that bleed.  Painful sexual intercourse. DIAGNOSIS  Warts are usually recognized with the naked eye on the vagina, vulva, perineum, anus, and rectum. Certain tests can also diagnose genital warts, such as:  A Pap test.  A tissue sample (biopsy) exam.  Colposcopy. A magnifying tool is used to examine the vagina and cervix. The HPV cells will change color when certain solutions are used. TREATMENT  Warts can be removed by:  Applying certain chemicals, such as cantharidin or podophyllin.  Liquid nitrogen freezing (cryotherapy).  Immunotherapy with Candida or Trichophyton injections.  Laser treatment.  Burning with an electrified probe (electrocautery).  Interferon injections.  Surgery. PREVENTION  HPV vaccination can help prevent HPV infections that cause genital warts and that cause cancer of the cervix. It is recommended that the vaccination be given to people between the ages 9 to 26 years old. The vaccine might not work as well or might not work at all if you already have HPV. It should not be given to pregnant women. HOME CARE INSTRUCTIONS   It is important to follow your caregiver's instructions. The warts will not go away without treatment. Repeat treatments are often needed to get rid of warts.  Even after it appears that the warts are gone, the normal tissue underneath often remains infected.  Do not try to treat genital warts with medicine used to treat hand warts. This type of medicine is strong and can burn the skin in the genital area, causing more damage.  Tell your past and current sexual partner(s) that you have genital warts. They may be infected also and need treatment.  Avoid sexual contact while being treated.  Do not touch or scratch the warts. The infection may spread to other parts of your body.  Women with genital warts should have a cervical cancer check (Pap test) at least once a year. This type of cancer is slow-growing and can be cured if found early. Chances of developing cervical cancer are increased with HPV.  Inform your obstetrician about your warts in the event of pregnancy. This virus can be passed to the baby's respiratory tract. Discuss this with your caregiver.  Use a condom during sexual intercourse. Following treatment, the use of condoms will help prevent reinfection.  Ask your caregiver about using over-the-counter anti-itch creams. SEEK MEDICAL CARE IF:   Your treated skin becomes red, swollen, or painful.  You have a fever.  You feel generally ill.  You feel little lumps in and around your genital area.  You are bleeding or have painful sexual intercourse. MAKE SURE YOU:   Understand these instructions.  Will watch your condition.  Will get help right away if you are not doing well or get worse. Document Released: 01/30/2000 Document Revised: 06/18/2013 Document Reviewed: 08/10/2010 Exit[employer] Patient Information 2015 [employer]. This   information is not intended to replace advice given to you by your health care provider. Make sure you discuss any questions you have with your health care provider.  

## 2013-12-28 NOTE — Progress Notes (Signed)
Reviewed personally.  M. Suzanne Miller, MD.  

## 2014-01-21 ENCOUNTER — Telehealth: Payer: Self-pay | Admitting: Obstetrics and Gynecology

## 2014-01-21 ENCOUNTER — Encounter: Payer: Self-pay | Admitting: Obstetrics and Gynecology

## 2014-01-21 ENCOUNTER — Ambulatory Visit (INDEPENDENT_AMBULATORY_CARE_PROVIDER_SITE_OTHER): Payer: BC Managed Care – PPO | Admitting: Obstetrics and Gynecology

## 2014-01-21 VITALS — BP 100/70 | HR 80 | Ht 63.0 in | Wt 171.0 lb

## 2014-01-21 DIAGNOSIS — A630 Anogenital (venereal) warts: Secondary | ICD-10-CM

## 2014-01-21 DIAGNOSIS — B3731 Acute candidiasis of vulva and vagina: Secondary | ICD-10-CM

## 2014-01-21 DIAGNOSIS — B373 Candidiasis of vulva and vagina: Secondary | ICD-10-CM

## 2014-01-21 MED ORDER — FLUCONAZOLE 150 MG PO TABS: 150.0000 mg | ORAL_TABLET | Freq: Once | ORAL | Status: DC

## 2014-01-21 MED ORDER — NYSTATIN 100000 UNIT/GM EX POWD: Freq: Three times a day (TID) | CUTANEOUS | Status: DC

## 2014-01-21 NOTE — Telephone Encounter (Signed)
Patient would like to discuss treatment options for genital warts.

## 2014-01-21 NOTE — Telephone Encounter (Signed)
Spoke with patient. Patient is currently at work and unable to give details regarding symptoms. "Can you check my record. I need to come in to be seen for the same thing." Patient was last seen for OV on 11/12 for condyloma. "This will be my third time being seen for this." Patient is requesting to see Dr.Silva today. Appointment scheduled for today at 2:45pm. Patient is agreeable to date and time.  Routing to provider for final review. Patient agreeable to disposition. Will close encounter

## 2014-01-21 NOTE — Progress Notes (Signed)
Patient ID: Toni Warren, female   DOB: 20-Jan-1986, 28 y.o.   MRN: [id number] GYNECOLOGY  VISIT   HPI: 28 y.o.   Single  African American  female   [phone] with No LMP recorded. Patient is not currently having periods (Reason: IUD).   here for follow up on condyloma.  Patient also complaining of vaginal itching and discharge. Between breasts also has some itching.  Sweats a lot.   Treatment for condyloma has been the following: Aldara did not help but used it for only one week.  Had cryotherapy and no improvement.  Patient has had TCA treatments twice - 12/10/13 and 12/27/13.  Having vaginal itching and burning.  Some odor.  Not using other the counter medication.   No recent antibiotics.   GYNECOLOGIC HISTORY: No LMP recorded. Patient is not currently having periods (Reason: IUD). Contraception: Mirena IUD Menopausal hormone therapy: n/a        OB History    Gravida Para Term Preterm AB TAB SAB Ectopic Multiple Living   3 2   1 1    2          Patient Active Problem List   Diagnosis Date Noted  . 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 Menorrhagia 03/29/2013  . General counseling for prescription of oral contraceptives 03/29/2013  . Abdominal hernia 09/29/2011    Past Medical History  Diagnosis Date  . Anemia   . Hyperlipidemia     postpartum 2+yrs ago  . Hypertension     postpartum 2+yrs ago  . History of bronchitis 3+yrs ago  . History of migraine 09/20/11-last one  . GERD (gastroesophageal reflux disease)     only takes OTC meds  . Sickle cell trait   . Anxiety   . Depression     but doesn't require meds  . 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 HSV-1 (herpes simplex virus 1) infection   . HSV-2 (herpes simplex virus 2) infection   . Condyloma acuminatum of vulva     Past Surgical History  Procedure Laterality Date  . Ovarian cyst removal  2012  . Eye surgery  1990's  . Ventral hernia repair  09/29/2011    Procedure: HERNIA REPAIR VENTRAL  ADULT;  Surgeon: Joyice Faster. Cornett, MD;  Location: [place];  Service: General;  Laterality: N/A;  . Abdominoplasty  09/29/2011    Procedure: ABDOMINOPLASTY;  Surgeon: Theodoro Kos, DO;  Location: [place];  Service: Plastics;  Laterality: N/A;  ABDOMINOPLASTY FOR REPAIR OF RECTUS DIASTASIS    Current Outpatient Prescriptions  Medication Sig Dispense Refill  . acetaminophen (TYLENOL) 500 MG tablet Take 1,000 mg by mouth every 6 (six) hours as needed. For pain    . aspirin-acetaminophen-caffeine (EXCEDRIN MIGRAINE) 250-250-65 MG per tablet Take 1 tablet by mouth daily as needed. For migraines    . levonorgestrel (MIRENA) 20 MCG/24HR IUD 1 each by Intrauterine route once.     No current facility-administered medications for this visit.     ALLERGIES: Percocet  Family History  Problem Relation Age of Onset  . Hypertension Father     History   Social History  . Marital Status: Single    Spouse Name: N/A    Number of Children: N/A  . Years of Education: N/A   Occupational History  . Not on file.   Social History Main Topics  . Smoking status: Former Research scientist (life sciences)  . Smokeless tobacco: Never Used  . Alcohol Use: No  .  Drug Use: No  . Sexual Activity:    Partners: Male    Birth Control/ Protection: IUD     Comment: Mirena IUD inserted 10-31-13   Other Topics Concern  . Not on file   Social History Narrative    ROS:  Pertinent items are noted in HPI.  PHYSICAL EXAMINATION:    BP 100/70 mmHg  Pulse 80  Ht 5\' 3"  (1.6 m)  Wt 171 lb (77.565 kg)  BMI 30.30 kg/m2     General appearance: alert, cooperative and appears stated age  Pelvic: External genitalia:  Right labia majora 0.75 cm condyloma, left labia majora with 0.5 cm condyloma and 0.75 cm condyloma.  All treated with TCA.               Urethra:  normal appearing urethra with no masses, tenderness or lesions              Bartholins and Skenes: normal                 Vagina: normal appearing vagina with normal color and  discharge, no lesions              Cervix: normal appearance.  IUD strings noted.                    Bimanual Exam:  Uterus:  uterus is normal size, shape, consistency and nontender                                      Adnexa: normal adnexa in size, nontender and no masses       Wet prep - pH 4.5, positive hyphae, negative yeast and trichomonas.                                ASSESSMENT  Persistent condyloma. Yeast vaginitis.  Yeast of breasts.  PLAN  Recheck in 3 weeks to see response to TCA. Will consider excision if lesions do not improve.  Patient understands that excision would not remove the virus that causes the condyloma and that they can recur. Diflucan 150 mg.  See orders.  Nystatin powder to breast folds.    An After Visit Summary was printed and given to the patient.  _15_____ minutes face to face time of which over 50% was spent in counseling.

## 2014-02-11 ENCOUNTER — Ambulatory Visit (INDEPENDENT_AMBULATORY_CARE_PROVIDER_SITE_OTHER): Payer: BC Managed Care – PPO | Admitting: Obstetrics and Gynecology

## 2014-02-11 ENCOUNTER — Encounter: Payer: Self-pay | Admitting: Obstetrics and Gynecology

## 2014-02-11 VITALS — BP 118/76 | HR 70 | Ht 63.0 in | Wt 170.0 lb

## 2014-02-11 DIAGNOSIS — R5381 Other malaise: Secondary | ICD-10-CM

## 2014-02-11 DIAGNOSIS — A630 Anogenital (venereal) warts: Secondary | ICD-10-CM

## 2014-02-11 DIAGNOSIS — R5383 Other fatigue: Secondary | ICD-10-CM

## 2014-02-11 LAB — COMPREHENSIVE METABOLIC PANEL
ALT: 16 U/L (ref 0–35)
AST: 15 U/L (ref 0–37)
Albumin: 4.3 g/dL (ref 3.5–5.2)
Alkaline Phosphatase: 84 U/L (ref 39–117)
BUN: 11 mg/dL (ref 6–23)
CO2: 25 mEq/L (ref 19–32)
Calcium: 9.5 mg/dL (ref 8.4–10.5)
Chloride: 103 mEq/L (ref 96–112)
Creat: 0.76 mg/dL (ref 0.50–1.10)
Glucose, Bld: 85 mg/dL (ref 70–99)
Potassium: 4.4 mEq/L (ref 3.5–5.3)
Sodium: 137 mEq/L (ref 135–145)
Total Bilirubin: 0.5 mg/dL (ref 0.2–1.2)
Total Protein: 6.7 g/dL (ref 6.0–8.3)

## 2014-02-11 NOTE — Progress Notes (Signed)
Patient ID: Toni Warren, female   DOB: Jan 09, 1986, 28 y.o.   MRN: [id number] GYNECOLOGY  VISIT   HPI: 28 y.o.   Single  African American  female   [phone] with No LMP recorded. Patient is not currently having periods (Reason: IUD).   here for follow up on condyloma. Patient complaining of fatigue.  Would like labs drawn.  No cycles since the IUD placed.  Left breast tenderness has essentially resolved.  Some nausea yesterday.  Not sexually active for 6 months - 1 year.   No one ill at home.   Hgb:  14.0  GYNECOLOGIC HISTORY: No LMP recorded. Patient is not currently having periods (Reason: IUD). Contraception:  Mirena IUD  Menopausal hormone therapy: n/a        OB History    Gravida Para Term Preterm AB TAB SAB Ectopic Multiple Living   3 2   1 1    2          Patient Active Problem List   Diagnosis Date Noted  . 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 Menorrhagia 03/29/2013  . General counseling for prescription of oral contraceptives 03/29/2013  . Abdominal hernia 09/29/2011    Past Medical History  Diagnosis Date  . Anemia   . Hyperlipidemia     postpartum 2+yrs ago  . Hypertension     postpartum 2+yrs ago  . History of bronchitis 3+yrs ago  . History of migraine 09/20/11-last one  . GERD (gastroesophageal reflux disease)     only takes OTC meds  . Sickle cell trait   . Anxiety   . Depression     but doesn't require meds  . 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 HSV-1 (herpes simplex virus 1) infection   . HSV-2 (herpes simplex virus 2) infection   . Condyloma acuminatum of vulva     Past Surgical History  Procedure Laterality Date  . Ovarian cyst removal  2012  . Eye surgery  1990's  . Ventral hernia repair  09/29/2011    Procedure: HERNIA REPAIR VENTRAL ADULT;  Surgeon: Joyice Faster. Cornett, MD;  Location: [place];  Service: General;  Laterality: N/A;  . Abdominoplasty  09/29/2011    Procedure: ABDOMINOPLASTY;  Surgeon: Theodoro Kos, DO;   Location: [place];  Service: Plastics;  Laterality: N/A;  ABDOMINOPLASTY FOR REPAIR OF RECTUS DIASTASIS    Current Outpatient Prescriptions  Medication Sig Dispense Refill  . acetaminophen (TYLENOL) 500 MG tablet Take 1,000 mg by mouth every 6 (six) hours as needed. For pain    . aspirin-acetaminophen-caffeine (EXCEDRIN MIGRAINE) 250-250-65 MG per tablet Take 1 tablet by mouth daily as needed. For migraines    . fluconazole (DIFLUCAN) 150 MG tablet Take 1 tablet (150 mg total) by mouth once. Take one tablet.  Repeat in 48 hours if symptoms are not completely resolved. 2 tablet 0  . levonorgestrel (MIRENA) 20 MCG/24HR IUD 1 each by Intrauterine route once.    . nystatin (MYCOSTATIN) powder Apply topically 3 (three) times daily. Apply to affected area for up to 7 days 15 g 2   No current facility-administered medications for this visit.     ALLERGIES: Percocet  Family History  Problem Relation Age of Onset  . Hypertension Father     History   Social History  . Marital Status: Single    Spouse Name: N/A    Number of Children: N/A  . Years of Education: N/A   Occupational  History  . Not on file.   Social History Main Topics  . Smoking status: Former Research scientist (life sciences)  . Smokeless tobacco: Never Used  . Alcohol Use: No  . Drug Use: No  . Sexual Activity:    Partners: Male    Birth Control/ Protection: IUD     Comment: Mirena IUD inserted 10-31-13   Other Topics Concern  . Not on file   Social History Narrative    ROS:  Pertinent items are noted in HPI.  PHYSICAL EXAMINATION:    BP 118/76 mmHg  Pulse 70  Ht 5\' 3"  (1.6 m)  Wt 170 lb (77.111 kg)  BMI 30.12 kg/m2     General appearance: alert, cooperative and appears stated age   Pelvic: External genitalia:   Condyloma improved - not as raised and have a smooth texture.  Two of the left labia majora, 0.75 cm each, two of the perineal body 3 mm each, one of the right labia majora. All retreated with TCA.              ASSESSMENT  Vulvar condyloma.  Fatigue.  Mirena IUD patient.  Not sexually active.   PLAN  Check TSH, CMP, CBC.  Counseled on condyloma.  Will do observational management at this time.  Return prn.    An After Visit Summary was printed and given to the patient.  _15_____ minutes face to face time of which over 50% was spent in counseling.

## 2014-02-12 LAB — CBC
HCT: 44.5 % (ref 36.0–46.0)
Hemoglobin: 14.1 g/dL (ref 12.0–15.0)
MCH: 28.3 pg (ref 26.0–34.0)
MCHC: 31.7 g/dL (ref 30.0–36.0)
MCV: 89.2 fL (ref 78.0–100.0)
MPV: 9.4 fL (ref 9.4–12.4)
Platelets: 386 10*3/uL (ref 150–400)
RBC: 4.99 MIL/uL (ref 3.87–5.11)
RDW: 13.7 % (ref 11.5–15.5)
WBC: 7 10*3/uL (ref 4.0–10.5)

## 2014-02-12 LAB — TSH: TSH: 1.746 u[IU]/mL (ref 0.350–4.500)

## 2014-02-12 LAB — HEMOGLOBIN, FINGERSTICK: Hemoglobin, fingerstick: 14 g/dL (ref 12.0–16.0)

## 2014-02-18 ENCOUNTER — Encounter: Payer: Self-pay | Admitting: Family Medicine

## 2014-02-18 ENCOUNTER — Ambulatory Visit (INDEPENDENT_AMBULATORY_CARE_PROVIDER_SITE_OTHER): Payer: Medicaid Other | Admitting: Family Medicine

## 2014-02-18 VITALS — BP 118/78 | HR 75 | Temp 98.2°F | Ht 62.5 in | Wt 169.2 lb

## 2014-02-18 DIAGNOSIS — L83 Acanthosis nigricans: Secondary | ICD-10-CM

## 2014-02-18 DIAGNOSIS — K219 Gastro-esophageal reflux disease without esophagitis: Secondary | ICD-10-CM

## 2014-02-18 DIAGNOSIS — R8290 Unspecified abnormal findings in urine: Secondary | ICD-10-CM

## 2014-02-18 DIAGNOSIS — E559 Vitamin D deficiency, unspecified: Secondary | ICD-10-CM

## 2014-02-18 DIAGNOSIS — Z8632 Personal history of gestational diabetes: Secondary | ICD-10-CM | POA: Diagnosis not present

## 2014-02-18 DIAGNOSIS — B354 Tinea corporis: Secondary | ICD-10-CM | POA: Diagnosis not present

## 2014-02-18 DIAGNOSIS — Z0000 Encounter for general adult medical examination without abnormal findings: Secondary | ICD-10-CM | POA: Diagnosis not present

## 2014-02-18 LAB — POCT URINALYSIS DIPSTICK
Bilirubin, UA: NEGATIVE
Glucose, UA: NEGATIVE
Ketones, UA: NEGATIVE
Leukocytes, UA: NEGATIVE
Nitrite, UA: NEGATIVE
Protein, UA: NEGATIVE
Spec Grav, UA: >=1.03
Urobilinogen, UA: NEGATIVE
pH, UA: 5.5

## 2014-02-18 LAB — MICROALBUMIN / CREATININE URINE RATIO
Creatinine,U: 113.1 mg/dL
Microalb Creat Ratio: 0.3 mg/g (ref 0.0–30.0)
Microalb, Ur: 0.3 mg/dL (ref 0.0–1.9)

## 2014-02-18 LAB — BASIC METABOLIC PANEL
BUN: 11 mg/dL (ref 6–23)
CO2: 26 mEq/L (ref 19–32)
Calcium: 9.3 mg/dL (ref 8.4–10.5)
Chloride: 106 mEq/L (ref 96–112)
Creatinine, Ser: 0.8 mg/dL (ref 0.4–1.2)
GFR: 104.81 mL/min (ref >60.00)
Glucose, Bld: 98 mg/dL (ref 70–99)
Potassium: 4 mEq/L (ref 3.5–5.1)
Sodium: 140 mEq/L (ref 135–145)

## 2014-02-18 LAB — HEPATIC FUNCTION PANEL
ALT: 21 U/L (ref 0–35)
AST: 21 U/L (ref 0–37)
Albumin: 4.3 g/dL (ref 3.5–5.2)
Alkaline Phosphatase: 74 U/L (ref 39–117)
Bilirubin, Direct: 0 mg/dL (ref 0.0–0.3)
Total Bilirubin: 0.5 mg/dL (ref 0.2–1.2)
Total Protein: 7.4 g/dL (ref 6.0–8.3)

## 2014-02-18 LAB — CBC WITH DIFFERENTIAL/PLATELET
Basophils Absolute: 0 10*3/uL (ref 0.0–0.1)
Basophils Relative: 0.9 % (ref 0.0–3.0)
Eosinophils Absolute: 0.1 10*3/uL (ref 0.0–0.7)
Eosinophils Relative: 2.8 % (ref 0.0–5.0)
HCT: 42.2 % (ref 36.0–46.0)
Hemoglobin: 14 g/dL (ref 12.0–15.0)
Lymphocytes Relative: 44.6 % (ref 12.0–46.0)
Lymphs Abs: 2 10*3/uL (ref 0.7–4.0)
MCHC: 33.3 g/dL (ref 30.0–36.0)
MCV: 85.3 fl (ref 78.0–100.0)
Monocytes Absolute: 0.3 10*3/uL (ref 0.1–1.0)
Monocytes Relative: 6.3 % (ref 3.0–12.0)
Neutro Abs: 2.1 10*3/uL (ref 1.4–7.7)
Neutrophils Relative %: 45.4 % (ref 43.0–77.0)
Platelets: 381 10*3/uL (ref 150.0–400.0)
RBC: 4.94 Mil/uL (ref 3.87–5.11)
RDW: 13.6 % (ref 11.5–15.5)
WBC: 4.5 10*3/uL (ref 4.0–10.5)

## 2014-02-18 LAB — H. PYLORI ANTIBODY, IGG: H Pylori IgG: POSITIVE — AB

## 2014-02-18 LAB — LIPID PANEL
Cholesterol: 151 mg/dL (ref 0–200)
HDL: 43.5 mg/dL (ref >39.00)
LDL Cholesterol: 91 mg/dL (ref 0–99)
NonHDL: 107.5
Total CHOL/HDL Ratio: 3
Triglycerides: 82 mg/dL (ref 0.0–149.0)
VLDL: 16.4 mg/dL (ref 0.0–40.0)

## 2014-02-18 LAB — TSH: TSH: 1.25 u[IU]/mL (ref 0.35–4.50)

## 2014-02-18 LAB — HEMOGLOBIN A1C: Hgb A1c MFr Bld: 5.9 % (ref 4.6–6.5)

## 2014-02-18 MED ORDER — NAFTIFINE HCL 1 % EX CREA: TOPICAL_CREAM | Freq: Every day | CUTANEOUS | Status: DC

## 2014-02-18 MED ORDER — OMEPRAZOLE 20 MG PO CPDR: 20.0000 mg | DELAYED_RELEASE_CAPSULE | Freq: Every day | ORAL | Status: DC

## 2014-02-18 NOTE — Patient Instructions (Addendum)
Preventive Care for Adults A healthy lifestyle and preventive care can promote health and wellness. Preventive health guidelines for women include the following key practices.  A routine yearly physical is a good way to check with your health care provider about your health and preventive screening. It is a chance to share any concerns and updates on your health and to receive a thorough exam.  Visit your dentist for a routine exam and preventive care every 6 months. Brush your teeth twice a day and floss once a day. Good oral hygiene prevents tooth decay and gum disease.  The frequency of eye exams is based on your age, health, family medical history, use of contact lenses, and other factors. Follow your health care provider's recommendations for frequency of eye exams.  Eat a healthy diet. Foods like vegetables, fruits, whole grains, low-fat dairy products, and lean protein foods contain the nutrients you need without too many calories. Decrease your intake of foods high in solid fats, added sugars, and salt. Eat the right amount of calories for you.Get information about a proper diet from your health care provider, if necessary.  Regular physical exercise is one of the most important things you can do for your health. Most adults should get at least 150 minutes of moderate-intensity exercise (any activity that increases your heart rate and causes you to sweat) each week. In addition, most adults need muscle-strengthening exercises on 2 or more days a week.  Maintain a healthy weight. The body mass index (BMI) is a screening tool to identify possible weight problems. It provides an estimate of body fat based on height and weight. Your health care provider can find your BMI and can help you achieve or maintain a healthy weight.For adults 20 years and older:  A BMI below 18.5 is considered underweight.  A BMI of 18.5 to 24.9 is normal.  A BMI of 25 to 29.9 is considered overweight.  A BMI of  30 and above is considered obese.  Maintain normal blood lipids and cholesterol levels by exercising and minimizing your intake of saturated fat. Eat a balanced diet with plenty of fruit and vegetables. Blood tests for lipids and cholesterol should begin at age 76 and be repeated every 5 years. If your lipid or cholesterol levels are high, you are over 50, or you are at high risk for heart disease, you may need your cholesterol levels checked more frequently.Ongoing high lipid and cholesterol levels should be treated with medicines if diet and exercise are not working.  If you smoke, find out from your health care provider how to quit. If you do not use tobacco, do not start.  Lung cancer screening is recommended for adults aged 22-80 years who are at high risk for developing lung cancer because of a history of smoking. A yearly low-dose CT scan of the lungs is recommended for people who have at least a 30-pack-year history of smoking and are a current smoker or have quit within the past 15 years. A pack year of smoking is smoking an average of 1 pack of cigarettes a day for 1 year (for example: 1 pack a day for 30 years or 2 packs a day for 15 years). Yearly screening should continue until the smoker has stopped smoking for at least 15 years. Yearly screening should be stopped for people who develop a health problem that would prevent them from having lung cancer treatment.  If you are pregnant, do not drink alcohol. If you are breastfeeding,  be very cautious about drinking alcohol. If you are not pregnant and choose to drink alcohol, do not have more than 1 drink per day. One drink is considered to be 12 ounces (355 mL) of beer, 5 ounces (148 mL) of wine, or 1.5 ounces (44 mL) of liquor.  Avoid use of street drugs. Do not share needles with anyone. Ask for help if you need support or instructions about stopping the use of drugs.  High blood pressure causes heart disease and increases the risk of  stroke. Your blood pressure should be checked at least every 1 to 2 years. Ongoing high blood pressure should be treated with medicines if weight loss and exercise do not work.  If you are 3-86 years old, ask your health care provider if you should take aspirin to prevent strokes.  Diabetes screening involves taking a blood sample to check your fasting blood sugar level. This should be done once every 3 years, after age 67, if you are within normal weight and without risk factors for diabetes. Testing should be considered at a younger age or be carried out more frequently if you are overweight and have at least 1 risk factor for diabetes.  Breast cancer screening is essential preventive care for women. You should practice "breast self-awareness." This means understanding the normal appearance and feel of your breasts and may include breast self-examination. Any changes detected, no matter how small, should be reported to a health care provider. Women in their 8s and 30s should have a clinical breast exam (CBE) by a health care provider as part of a regular health exam every 1 to 3 years. After age 70, women should have a CBE every year. Starting at age 25, women should consider having a mammogram (breast X-ray test) every year. Women who have a family history of breast cancer should talk to their health care provider about genetic screening. Women at a high risk of breast cancer should talk to their health care providers about having an MRI and a mammogram every year.  Breast cancer gene (BRCA)-related cancer risk assessment is recommended for women who have family members with BRCA-related cancers. BRCA-related cancers include breast, ovarian, tubal, and peritoneal cancers. Having family members with these cancers may be associated with an increased risk for harmful changes (mutations) in the breast cancer genes BRCA1 and BRCA2. Results of the assessment will determine the need for genetic counseling and  BRCA1 and BRCA2 testing.  Routine pelvic exams to screen for cancer are no longer recommended for nonpregnant women who are considered low risk for cancer of the pelvic organs (ovaries, uterus, and vagina) and who do not have symptoms. Ask your health care provider if a screening pelvic exam is right for you.  If you have had past treatment for cervical cancer or a condition that could lead to cancer, you need Pap tests and screening for cancer for at least 20 years after your treatment. If Pap tests have been discontinued, your risk factors (such as having a new sexual partner) need to be reassessed to determine if screening should be resumed. Some women have medical problems that increase the chance of getting cervical cancer. In these cases, your health care provider may recommend more frequent screening and Pap tests.  The HPV test is an additional test that may be used for cervical cancer screening. The HPV test looks for the virus that can cause the cell changes on the cervix. The cells collected during the Pap test can be  tested for HPV. The HPV test could be used to screen women aged 30 years and older, and should be used in women of any age who have unclear Pap test results. After the age of 30, women should have HPV testing at the same frequency as a Pap test.  Colorectal cancer can be detected and often prevented. Most routine colorectal cancer screening begins at the age of 50 years and continues through age 75 years. However, your health care provider may recommend screening at an earlier age if you have risk factors for colon cancer. On a yearly basis, your health care provider may provide home test kits to check for hidden blood in the stool. Use of a small camera at the end of a tube, to directly examine the colon (sigmoidoscopy or colonoscopy), can detect the earliest forms of colorectal cancer. Talk to your health care provider about this at age 50, when routine screening begins. Direct  exam of the colon should be repeated every 5-10 years through age 75 years, unless early forms of pre-cancerous polyps or small growths are found.  People who are at an increased risk for hepatitis B should be screened for this virus. You are considered at high risk for hepatitis B if:  You were born in a country where hepatitis B occurs often. Talk with your health care provider about which countries are considered high risk.  Your parents were born in a high-risk country and you have not received a shot to protect against hepatitis B (hepatitis B vaccine).  You have HIV or AIDS.  You use needles to inject street drugs.  You live with, or have sex with, someone who has hepatitis B.  You get hemodialysis treatment.  You take certain medicines for conditions like cancer, organ transplantation, and autoimmune conditions.  Hepatitis C blood testing is recommended for all people born from 1945 through 1965 and any individual with known risks for hepatitis C.  Practice safe sex. Use condoms and avoid high-risk sexual practices to reduce the spread of sexually transmitted infections (STIs). STIs include gonorrhea, chlamydia, syphilis, trichomonas, herpes, HPV, and human immunodeficiency virus (HIV). Herpes, HIV, and HPV are viral illnesses that have no cure. They can result in disability, cancer, and death.  You should be screened for sexually transmitted illnesses (STIs) including gonorrhea and chlamydia if:  You are sexually active and are younger than 24 years.  You are older than 24 years and your health care provider tells you that you are at risk for this type of infection.  Your sexual activity has changed since you were last screened and you are at an increased risk for chlamydia or gonorrhea. Ask your health care provider if you are at risk.  If you are at risk of being infected with HIV, it is recommended that you take a prescription medicine daily to prevent HIV infection. This is  called preexposure prophylaxis (PrEP). You are considered at risk if:  You are a heterosexual woman, are sexually active, and are at increased risk for HIV infection.  You take drugs by injection.  You are sexually active with a partner who has HIV.  Talk with your health care provider about whether you are at high risk of being infected with HIV. If you choose to begin PrEP, you should first be tested for HIV. You should then be tested every 3 months for as long as you are taking PrEP.  Osteoporosis is a disease in which the bones lose minerals and strength   with aging. This can result in serious bone fractures or breaks. The risk of osteoporosis can be identified using a bone density scan. Women ages 65 years and over and women at risk for fractures or osteoporosis should discuss screening with their health care providers. Ask your health care provider whether you should take a calcium supplement or vitamin D to reduce the rate of osteoporosis.  Menopause can be associated with physical symptoms and risks. Hormone replacement therapy is available to decrease symptoms and risks. You should talk to your health care provider about whether hormone replacement therapy is right for you.  Use sunscreen. Apply sunscreen liberally and repeatedly throughout the day. You should seek shade when your shadow is shorter than you. Protect yourself by wearing long sleeves, pants, a wide-brimmed hat, and sunglasses year round, whenever you are outdoors.  Once a month, do a whole body skin exam, using a mirror to look at the skin on your back. Tell your health care provider of new moles, moles that have irregular borders, moles that are larger than a pencil eraser, or moles that have changed in shape or color.  Stay current with required vaccines (immunizations).  Influenza vaccine. All adults should be immunized every year.  Tetanus, diphtheria, and acellular pertussis (Td, Tdap) vaccine. Pregnant women should  receive 1 dose of Tdap vaccine during each pregnancy. The dose should be obtained regardless of the length of time since the last dose. Immunization is preferred during the 27th-36th week of gestation. An adult who has not previously received Tdap or who does not know her vaccine status should receive 1 dose of Tdap. This initial dose should be followed by tetanus and diphtheria toxoids (Td) booster doses every 10 years. Adults with an unknown or incomplete history of completing a 3-dose immunization series with Td-containing vaccines should begin or complete a primary immunization series including a Tdap dose. Adults should receive a Td booster every 10 years.  Varicella vaccine. An adult without evidence of immunity to varicella should receive 2 doses or a second dose if she has previously received 1 dose. Pregnant females who do not have evidence of immunity should receive the first dose after pregnancy. This first dose should be obtained before leaving the health care facility. The second dose should be obtained 4-8 weeks after the first dose.  Human papillomavirus (HPV) vaccine. Females aged 13-26 years who have not received the vaccine previously should obtain the 3-dose series. The vaccine is not recommended for use in pregnant females. However, pregnancy testing is not needed before receiving a dose. If a female is found to be pregnant after receiving a dose, no treatment is needed. In that case, the remaining doses should be delayed until after the pregnancy. Immunization is recommended for any person with an immunocompromised condition through the age of 26 years if she did not get any or all doses earlier. During the 3-dose series, the second dose should be obtained 4-8 weeks after the first dose. The third dose should be obtained 24 weeks after the first dose and 16 weeks after the second dose.  Zoster vaccine. One dose is recommended for adults aged 60 years or older unless certain conditions are  present.  Measles, mumps, and rubella (MMR) vaccine. Adults born before 1957 generally are considered immune to measles and mumps. Adults born in 1957 or later should have 1 or more doses of MMR vaccine unless there is a contraindication to the vaccine or there is laboratory evidence of immunity to   each of the three diseases. A routine second dose of MMR vaccine should be obtained at least 28 days after the first dose for students attending postsecondary schools, health care workers, or international travelers. People who received inactivated measles vaccine or an unknown type of measles vaccine during 1963-1967 should receive 2 doses of MMR vaccine. People who received inactivated mumps vaccine or an unknown type of mumps vaccine before 1979 and are at high risk for mumps infection should consider immunization with 2 doses of MMR vaccine. For females of childbearing age, rubella immunity should be determined. If there is no evidence of immunity, females who are not pregnant should be vaccinated. If there is no evidence of immunity, females who are pregnant should delay immunization until after pregnancy. Unvaccinated health care workers born before 1957 who lack laboratory evidence of measles, mumps, or rubella immunity or laboratory confirmation of disease should consider measles and mumps immunization with 2 doses of MMR vaccine or rubella immunization with 1 dose of MMR vaccine.  Pneumococcal 13-valent conjugate (PCV13) vaccine. When indicated, a person who is uncertain of her immunization history and has no record of immunization should receive the PCV13 vaccine. An adult aged 19 years or older who has certain medical conditions and has not been previously immunized should receive 1 dose of PCV13 vaccine. This PCV13 should be followed with a dose of pneumococcal polysaccharide (PPSV23) vaccine. The PPSV23 vaccine dose should be obtained at least 8 weeks after the dose of PCV13 vaccine. An adult aged 19  years or older who has certain medical conditions and previously received 1 or more doses of PPSV23 vaccine should receive 1 dose of PCV13. The PCV13 vaccine dose should be obtained 1 or more years after the last PPSV23 vaccine dose.  Pneumococcal polysaccharide (PPSV23) vaccine. When PCV13 is also indicated, PCV13 should be obtained first. All adults aged 65 years and older should be immunized. An adult younger than age 65 years who has certain medical conditions should be immunized. Any person who resides in a nursing home or long-term care facility should be immunized. An adult smoker should be immunized. People with an immunocompromised condition and certain other conditions should receive both PCV13 and PPSV23 vaccines. People with human immunodeficiency virus (HIV) infection should be immunized as soon as possible after diagnosis. Immunization during chemotherapy or radiation therapy should be avoided. Routine use of PPSV23 vaccine is not recommended for American Indians, [place] Natives, or people younger than 65 years unless there are medical conditions that require PPSV23 vaccine. When indicated, people who have unknown immunization and have no record of immunization should receive PPSV23 vaccine. One-time revaccination 5 years after the first dose of PPSV23 is recommended for people aged 19-64 years who have chronic kidney failure, nephrotic syndrome, asplenia, or immunocompromised conditions. People who received 1-2 doses of PPSV23 before age 65 years should receive another dose of PPSV23 vaccine at age 65 years or later if at least 5 years have passed since the previous dose. Doses of PPSV23 are not needed for people immunized with PPSV23 at or after age 65 years.  Meningococcal vaccine. Adults with asplenia or persistent complement component deficiencies should receive 2 doses of quadrivalent meningococcal conjugate (MenACWY-D) vaccine. The doses should be obtained at least 2 months apart.  Microbiologists working with certain meningococcal bacteria, military recruits, people at risk during an outbreak, and people who travel to or live in countries with a high rate of meningitis should be immunized. A first-year college student up through age   21 years who is living in a residence hall should receive a dose if she did not receive a dose on or after her 16th birthday. Adults who have certain high-risk conditions should receive one or more doses of vaccine.  Hepatitis A vaccine. Adults who wish to be protected from this disease, have certain high-risk conditions, work with hepatitis A-infected animals, work in hepatitis A research labs, or travel to or work in countries with a high rate of hepatitis A should be immunized. Adults who were previously unvaccinated and who anticipate close contact with an international adoptee during the first 60 days after arrival in the [place] from a country with a high rate of hepatitis A should be immunized.  Hepatitis B vaccine. Adults who wish to be protected from this disease, have certain high-risk conditions, may be exposed to blood or other infectious body fluids, are household contacts or sex partners of hepatitis B positive people, are clients or workers in certain care facilities, or travel to or work in countries with a high rate of hepatitis B should be immunized.  Haemophilus influenzae type b (Hib) vaccine. A previously unvaccinated person with asplenia or sickle cell disease or having a scheduled splenectomy should receive 1 dose of Hib vaccine. Regardless of previous immunization, a recipient of a hematopoietic stem cell transplant should receive a 3-dose series 6-12 months after her successful transplant. Hib vaccine is not recommended for adults with HIV infection. Preventive Services / Frequency Ages 64 to 68 years  Blood pressure check.** / Every 1 to 2 years.  Lipid and cholesterol check.** / Every 5 years beginning at age  22.  Clinical breast exam.** / Every 3 years for women in their 88s and 53s.  BRCA-related cancer risk assessment.** / For women who have family members with a BRCA-related cancer (breast, ovarian, tubal, or peritoneal cancers).  Pap test.** / Every 2 years from ages 90 through 51. Every 3 years starting at age 21 through age 56 or 3 with a history of 3 consecutive normal Pap tests.  HPV screening.** / Every 3 years from ages 24 through ages 1 to 46 with a history of 3 consecutive normal Pap tests.  Hepatitis C blood test.** / For any individual with known risks for hepatitis C.  Skin self-exam. / Monthly.  Influenza vaccine. / Every year.  Tetanus, diphtheria, and acellular pertussis (Tdap, Td) vaccine.** / Consult your health care provider. Pregnant women should receive 1 dose of Tdap vaccine during each pregnancy. 1 dose of Td every 10 years.  Varicella vaccine.** / Consult your health care provider. Pregnant females who do not have evidence of immunity should receive the first dose after pregnancy.  HPV vaccine. / 3 doses over 6 months, if 72 and younger. The vaccine is not recommended for use in pregnant females. However, pregnancy testing is not needed before receiving a dose.  Measles, mumps, rubella (MMR) vaccine.** / You need at least 1 dose of MMR if you were born in 1957 or later. You may also need a 2nd dose. For females of childbearing age, rubella immunity should be determined. If there is no evidence of immunity, females who are not pregnant should be vaccinated. If there is no evidence of immunity, females who are pregnant should delay immunization until after pregnancy.  Pneumococcal 13-valent conjugate (PCV13) vaccine.** / Consult your health care provider.  Pneumococcal polysaccharide (PPSV23) vaccine.** / 1 to 2 doses if you smoke cigarettes or if you have certain conditions.  Meningococcal vaccine.** /  1 dose if you are age 19 to 21 years and a first-year college  student living in a residence hall, or have one of several medical conditions, you need to get vaccinated against meningococcal disease. You may also need additional booster doses.  Hepatitis A vaccine.** / Consult your health care provider.  Hepatitis B vaccine.** / Consult your health care provider.  Haemophilus influenzae type b (Hib) vaccine.** / Consult your health care provider. Ages 40 to 64 years  Blood pressure check.** / Every 1 to 2 years.  Lipid and cholesterol check.** / Every 5 years beginning at age 20 years.  Lung cancer screening. / Every year if you are aged 55-80 years and have a 30-pack-year history of smoking and currently smoke or have quit within the past 15 years. Yearly screening is stopped once you have quit smoking for at least 15 years or develop a health problem that would prevent you from having lung cancer treatment.  Clinical breast exam.** / Every year after age 40 years.  BRCA-related cancer risk assessment.** / For women who have family members with a BRCA-related cancer (breast, ovarian, tubal, or peritoneal cancers).  Mammogram.** / Every year beginning at age 40 years and continuing for as long as you are in good health. Consult with your health care provider.  Pap test.** / Every 3 years starting at age 30 years through age 65 or 70 years with a history of 3 consecutive normal Pap tests.  HPV screening.** / Every 3 years from ages 30 years through ages 65 to 70 years with a history of 3 consecutive normal Pap tests.  Fecal occult blood test (FOBT) of stool. / Every year beginning at age 50 years and continuing until age 75 years. You may not need to do this test if you get a colonoscopy every 10 years.  Flexible sigmoidoscopy or colonoscopy.** / Every 5 years for a flexible sigmoidoscopy or every 10 years for a colonoscopy beginning at age 50 years and continuing until age 75 years.  Hepatitis C blood test.** / For all people born from 1945 through  1965 and any individual with known risks for hepatitis C.  Skin self-exam. / Monthly.  Influenza vaccine. / Every year.  Tetanus, diphtheria, and acellular pertussis (Tdap/Td) vaccine.** / Consult your health care provider. Pregnant women should receive 1 dose of Tdap vaccine during each pregnancy. 1 dose of Td every 10 years.  Varicella vaccine.** / Consult your health care provider. Pregnant females who do not have evidence of immunity should receive the first dose after pregnancy.  Zoster vaccine.** / 1 dose for adults aged 60 years or older.  Measles, mumps, rubella (MMR) vaccine.** / You need at least 1 dose of MMR if you were born in 1957 or later. You may also need a 2nd dose. For females of childbearing age, rubella immunity should be determined. If there is no evidence of immunity, females who are not pregnant should be vaccinated. If there is no evidence of immunity, females who are pregnant should delay immunization until after pregnancy.  Pneumococcal 13-valent conjugate (PCV13) vaccine.** / Consult your health care provider.  Pneumococcal polysaccharide (PPSV23) vaccine.** / 1 to 2 doses if you smoke cigarettes or if you have certain conditions.  Meningococcal vaccine.** / Consult your health care provider.  Hepatitis A vaccine.** / Consult your health care provider.  Hepatitis B vaccine.** / Consult your health care provider.  Haemophilus influenzae type b (Hib) vaccine.** / Consult your health care provider. Ages 65   years and over  Blood pressure check.** / Every 1 to 2 years.  Lipid and cholesterol check.** / Every 5 years beginning at age 20 years.  Lung cancer screening. / Every year if you are aged 55-80 years and have a 30-pack-year history of smoking and currently smoke or have quit within the past 15 years. Yearly screening is stopped once you have quit smoking for at least 15 years or develop a health problem that would prevent you from having lung cancer  treatment.  Clinical breast exam.** / Every year after age 40 years.  BRCA-related cancer risk assessment.** / For women who have family members with a BRCA-related cancer (breast, ovarian, tubal, or peritoneal cancers).  Mammogram.** / Every year beginning at age 40 years and continuing for as long as you are in good health. Consult with your health care provider.  Pap test.** / Every 3 years starting at age 30 years through age 65 or 70 years with 3 consecutive normal Pap tests. Testing can be stopped between 65 and 70 years with 3 consecutive normal Pap tests and no abnormal Pap or HPV tests in the past 10 years.  HPV screening.** / Every 3 years from ages 30 years through ages 65 or 70 years with a history of 3 consecutive normal Pap tests. Testing can be stopped between 65 and 70 years with 3 consecutive normal Pap tests and no abnormal Pap or HPV tests in the past 10 years.  Fecal occult blood test (FOBT) of stool. / Every year beginning at age 50 years and continuing until age 75 years. You may not need to do this test if you get a colonoscopy every 10 years.  Flexible sigmoidoscopy or colonoscopy.** / Every 5 years for a flexible sigmoidoscopy or every 10 years for a colonoscopy beginning at age 50 years and continuing until age 75 years.  Hepatitis C blood test.** / For all people born from 1945 through 1965 and any individual with known risks for hepatitis C.  Osteoporosis screening.** / A one-time screening for women ages 65 years and over and women at risk for fractures or osteoporosis.  Skin self-exam. / Monthly.  Influenza vaccine. / Every year.  Tetanus, diphtheria, and acellular pertussis (Tdap/Td) vaccine.** / 1 dose of Td every 10 years.  Varicella vaccine.** / Consult your health care provider.  Zoster vaccine.** / 1 dose for adults aged 60 years or older.  Pneumococcal 13-valent conjugate (PCV13) vaccine.** / Consult your health care provider.  Pneumococcal  polysaccharide (PPSV23) vaccine.** / 1 dose for all adults aged 65 years and older.  Meningococcal vaccine.** / Consult your health care provider.  Hepatitis A vaccine.** / Consult your health care provider.  Hepatitis B vaccine.** / Consult your health care provider.  Haemophilus influenzae type b (Hib) vaccine.** / Consult your health care provider. ** Family history and personal history of risk and conditions may change your health care provider's recommendations. Document Released: 03/30/2001 Document Revised: 06/18/2013 Document Reviewed: 06/29/2010 [employer] Patient Information 2015 [employer]. This information is not intended to replace advice given to you by your health care provider. Make sure you discuss any questions you have with your health care provider.  

## 2014-02-18 NOTE — Progress Notes (Signed)
Subjective:     Toni Warren is a 29 y.o. female and is here for a comprehensive physical exam. The patient reports problems - heartburn x several months, no otc meds.  occurs with eating acidic foods,  + skin rash no better ,see last ov.  History   Social History  . Marital Status: Single    Spouse Name: N/A    Number of Children: N/A  . Years of Education: N/A   Occupational History  . Not on file.   Social History Main Topics  . Smoking status: Former Research scientist (life sciences)  . Smokeless tobacco: Never Used  . Alcohol Use: No  . Drug Use: No  . Sexual Activity:    Partners: Male    Birth Control/ Protection: IUD     Comment: Mirena IUD inserted 10-31-13   Other Topics Concern  . Not on file   Social History Narrative   Health Maintenance  Topic Date Due  . Samul Dada  07/08/2004  . INFLUENZA VACCINE  10/31/2014 (Originally 09/15/2013)  . PAP SMEAR  08/03/2016    The following portions of the patient's history were reviewed and updated as appropriate:  She  has a past medical history of Anemia; Hyperlipidemia; Hypertension; History of bronchitis (3+yrs ago); History of migraine (09/20/11-last one); GERD (gastroesophageal reflux disease); Sickle cell trait; Anxiety; Depression; Abnormal Pap smear of cervix (2005); HSV-1 (herpes simplex virus 1) infection; HSV-2 (herpes simplex virus 2) infection; and Condyloma acuminatum of vulva. She  does not have any pertinent problems on file. She  has past surgical history that includes Ovarian cyst removal (2012); Eye surgery (1990's); Ventral hernia repair (09/29/2011); and Abdominoplasty (09/29/2011). Her family history includes Hypertension in her father. She  reports that she has quit smoking. She has never used smokeless tobacco. She reports that she does not drink alcohol or use illicit drugs. She has a current medication list which includes the following prescription(s): acetaminophen, aspirin-acetaminophen-caffeine, levonorgestrel, naftifine,  and omeprazole. Current Outpatient Prescriptions on File Prior to Visit  Medication Sig Dispense Refill  . acetaminophen (TYLENOL) 500 MG tablet Take 1,000 mg by mouth every 6 (six) hours as needed. For pain    . aspirin-acetaminophen-caffeine (EXCEDRIN MIGRAINE) 250-250-65 MG per tablet Take 1 tablet by mouth daily as needed. For migraines    . levonorgestrel (MIRENA) 20 MCG/24HR IUD 1 each by Intrauterine route once.     No current facility-administered medications on file prior to visit.   She is allergic to percocet..  Review of Systems Review of Systems  Constitutional: Negative for activity change, appetite change and fatigue.  HENT: Negative for hearing loss, congestion, tinnitus and ear discharge.  dentist-- due Eyes: Negative for visual disturbance (see optho -- due Respiratory: Negative for cough, chest tightness and shortness of breath.   Cardiovascular: Negative for chest pain, palpitations and leg swelling.  Gastrointestinal: Negative for abdominal pain, diarrhea, constipation and abdominal distention. + gerd symptoms Genitourinary: Negative for urgency, frequency, decreased urine volume and difficulty urinating.  Musculoskeletal: Negative for back pain, arthralgias and gait problem.  Skin: Negative for color change, pallor and + pruritic rash between breasts Neurological: Negative for dizziness, light-headedness, numbness and headaches.  Hematological: Negative for adenopathy. Does not bruise/bleed easily.  Psychiatric/Behavioral: Negative for suicidal ideas, confusion, sleep disturbance, self-injury, dysphoric mood, decreased concentration and agitation.       Objective:    BP 118/78 mmHg  Pulse 75  Temp(Src) 98.2 F (36.8 C) (Oral)  Ht 5' 2.5" (1.588 m)  Wt 169 lb  3.2 oz (76.749 kg)  BMI 30.43 kg/m2  SpO2 95% General appearance: alert, cooperative, appears stated age and no distress Head: Normocephalic, without obvious abnormality, atraumatic Eyes:  conjunctivae/corneas clear. PERRL, EOM's intact. Fundi benign. Ears: normal TM's and external ear canals both ears Nose: Nares normal. Septum midline. Mucosa normal. No drainage or sinus tenderness. Throat: lips, mucosa, and tongue normal; teeth and gums normal Neck: no adenopathy, no carotid bruit, no JVD, supple, symmetrical, trachea midline and thyroid not enlarged, symmetric, no tenderness/mass/nodules Back: symmetric, no curvature. ROM normal. No CVA tenderness. Lungs: clear to auscultation bilaterally Breasts: gyn Heart: regular rate and rhythm, S1, S2 normal, no murmur, click, rub or gallop Abdomen: soft, non-tender; bowel sounds normal; no masses,  no organomegaly Pelvic: deferred--gyn Extremities: extremities normal, atraumatic, no cyanosis or edema Pulses: 2+ and symmetric Skin: hyperpigmentation - neck, between breasts and under Lymph nodes: Cervical, supraclavicular, and axillary nodes normal. Neurologic: Alert and oriented X 3, normal strength and tone. Normal symmetric reflexes. Normal coordination and gait Psych-  No depression, no anxiety      Assessment:    Healthy female exam.      Plan:    ghm utd Check labs See After Visit Summary for Counseling Recommendations    1. Tinea corporis  - naftifine (NAFTIN) 1 % cream; Apply topically daily.  Dispense: 30 g; Refill: 0  2. Gastroesophageal reflux disease without esophagitis a - omeprazole (PRILOSEC) 20 MG capsule; Take 1 capsule (20 mg total) by mouth daily.  Dispense: 30 capsule; Refill: 3 - H. pylori antibody, IgG  3. Preventative health care  - Basic metabolic panel - CBC with Differential - Hepatic function panel - Lipid panel - Microalbumin / creatinine urine ratio - POCT urinalysis dipstick - TSH  4. Vitamin D deficiency  - Vitamin D 1,25 dihydroxy

## 2014-02-18 NOTE — Progress Notes (Signed)
Pre visit review using our clinic review tool, if applicable. No additional management support is needed unless otherwise documented below in the visit note. 

## 2014-02-19 ENCOUNTER — Inpatient Hospital Stay (HOSPITAL_COMMUNITY)
Admission: AD | Admit: 2014-02-19 | Discharge: 2014-02-20 | Disposition: A | Discharge status: Home / Self Care | Payer: Medicaid Other | Source: Ambulatory Visit | Attending: Obstetrics & Gynecology | Admitting: Obstetrics & Gynecology

## 2014-02-19 ENCOUNTER — Telehealth: Payer: Self-pay | Admitting: *Deleted

## 2014-02-19 ENCOUNTER — Encounter: Payer: Self-pay | Admitting: Family Medicine

## 2014-02-19 ENCOUNTER — Encounter (HOSPITAL_COMMUNITY): Payer: Self-pay | Admitting: *Deleted

## 2014-02-19 ENCOUNTER — Telehealth: Payer: Self-pay

## 2014-02-19 DIAGNOSIS — R109 Unspecified abdominal pain: Secondary | ICD-10-CM | POA: Insufficient documentation

## 2014-02-19 DIAGNOSIS — A048 Other specified bacterial intestinal infections: Secondary | ICD-10-CM

## 2014-02-19 DIAGNOSIS — Z87891 Personal history of nicotine dependence: Secondary | ICD-10-CM | POA: Insufficient documentation

## 2014-02-19 DIAGNOSIS — K219 Gastro-esophageal reflux disease without esophagitis: Secondary | ICD-10-CM | POA: Insufficient documentation

## 2014-02-19 DIAGNOSIS — Z30431 Encounter for routine checking of intrauterine contraceptive device: Secondary | ICD-10-CM | POA: Insufficient documentation

## 2014-02-19 LAB — URINE CULTURE: Colony Count: 8000

## 2014-02-19 LAB — POCT PREGNANCY, URINE: Preg Test, Ur: NEGATIVE

## 2014-02-19 MED ORDER — TETRACYCLINE HCL 500 MG PO CAPS: 500.0000 mg | ORAL_CAPSULE | Freq: Four times a day (QID) | ORAL | Status: DC

## 2014-02-19 MED ORDER — BISMUTH SUBSALICYLATE 525 MG/15ML PO SUSP: 15.0000 mL | Freq: Four times a day (QID) | ORAL | Status: DC

## 2014-02-19 MED ORDER — METRONIDAZOLE 500 MG PO TABS: 500.0000 mg | ORAL_TABLET | Freq: Four times a day (QID) | ORAL | Status: DC

## 2014-02-19 NOTE — MAU Note (Signed)
PT  SAYS  SHE HAS IN IUD-   SAYS SHE CAN FEEL  STRING.  PUT IN   DR Quincy Simmonds-   IN 11-30-2013...    STARTED HAVING  ABD PAIN-  ON CHRISTMAS-   THEN STARTED  VAG SPOTTING  ON 12-31-   LAST SEX- 02-2013

## 2014-02-19 NOTE — Telephone Encounter (Signed)
Prior authorization initiated through [employer] for Naftin 1% cream. Awaiting determination. JG//CMA

## 2014-02-19 NOTE — Telephone Encounter (Signed)
-----   Message from [name], DO sent at 02/18/2014 10:53 PM EST ----- + h pylori ----- increase omeprazole 20 mg 1  2x a day  Bismuth subsalicylate 518 mg 1 , 4x a day Metronidazole 500 mg 4x a daytetracycline 500 mg 4x a day --- all for 2 weeks and refer to GI

## 2014-02-19 NOTE — Telephone Encounter (Signed)
Not really sure what else she wants.  Labs normal except h pylori.

## 2014-02-19 NOTE — Telephone Encounter (Signed)
discussed with patient and she verbalized understanding of the H-pylori. She has agreed to take the medication as directed and will f/u with GI. Ref has been placed.  She is also aware of Negative UC and the other results that are WNL.,      KP

## 2014-02-20 ENCOUNTER — Encounter (HOSPITAL_COMMUNITY): Payer: Self-pay | Admitting: *Deleted

## 2014-02-20 DIAGNOSIS — R109 Unspecified abdominal pain: Secondary | ICD-10-CM

## 2014-02-20 LAB — URINE MICROSCOPIC-ADD ON

## 2014-02-20 LAB — URINALYSIS, ROUTINE W REFLEX MICROSCOPIC
Bilirubin Urine: NEGATIVE
Glucose, UA: NEGATIVE mg/dL
Ketones, ur: NEGATIVE mg/dL
Leukocytes, UA: NEGATIVE
Nitrite: NEGATIVE
Protein, ur: NEGATIVE mg/dL
Specific Gravity, Urine: 1.01 (ref 1.005–1.030)
Urobilinogen, UA: 0.2 mg/dL (ref 0.0–1.0)
pH: 6 (ref 5.0–8.0)

## 2014-02-20 NOTE — MAU Provider Note (Signed)
History     CSN: [id number]  Arrival date and time: 02/19/14 2243   First Provider Initiated Contact with Patient 02/20/14 0347      No chief complaint on file.  HPI  Toni Warren is a 29 y.o. [id number] presents today with cramping. She also states that she can feel her IUD strings at the vagina. She states that she is also having a period, and that she has not had a period as heavy as it currently is right now since she had the IUD placed.   Past Medical History  Diagnosis Date  . Anemia   . Hyperlipidemia     postpartum 2+yrs ago  . Hypertension     postpartum 2+yrs ago  . History of bronchitis 3+yrs ago  . History of migraine 09/20/11-last one  . GERD (gastroesophageal reflux disease)     only takes OTC meds  . Sickle cell trait   . Anxiety   . Depression     but doesn't require meds  . 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 HSV-1 (herpes simplex virus 1) infection   . HSV-2 (herpes simplex virus 2) infection   . Condyloma acuminatum of vulva     Past Surgical History  Procedure Laterality Date  . Ovarian cyst removal  2012  . Eye surgery  1990's  . Ventral hernia repair  09/29/2011    Procedure: HERNIA REPAIR VENTRAL ADULT;  Surgeon: Joyice Faster. Cornett, MD;  Location: [facility];  Service: General;  Laterality: N/A;  . Abdominoplasty  09/29/2011    Procedure: ABDOMINOPLASTY;  Surgeon: Theodoro Kos, DO;  Location: [facility];  Service: Plastics;  Laterality: N/A;  ABDOMINOPLASTY FOR REPAIR OF RECTUS DIASTASIS    Family History  Problem Relation Age of Onset  . Hypertension Father     History  Substance Use Topics  . Smoking status: Former Research scientist (life sciences)  . Smokeless tobacco: Never Used  . Alcohol Use: No    Allergies:  Allergies  Allergen Reactions  . Percocet [Oxycodone-Acetaminophen] Itching    Prescriptions prior to admission  Medication Sig Dispense Refill Last Dose  . acetaminophen (TYLENOL) 500 MG tablet Take 1,000 mg by mouth every 6 (six) hours as  needed. For pain   Taking  . aspirin-acetaminophen-caffeine (EXCEDRIN MIGRAINE) 250-250-65 MG per tablet Take 1 tablet by mouth daily as needed. For migraines   Taking  . Bismuth Subsalicylate 147 WG/95AO SUSP Take 15 mLs (525 mg total) by mouth 4 (four) times daily. 420 mL 0   . levonorgestrel (MIRENA) 20 MCG/24HR IUD 1 each by Intrauterine route once.   Taking  . metroNIDAZOLE (FLAGYL) 500 MG tablet Take 1 tablet (500 mg total) by mouth 4 (four) times daily. For 14 days for H.Pylori 56 tablet 0   . naftifine (NAFTIN) 1 % cream Apply topically daily. 30 g 0   . omeprazole (PRILOSEC) 20 MG capsule Take 1 capsule (20 mg total) by mouth daily. 30 capsule 3   . tetracycline (ACHROMYCIN,SUMYCIN) 500 MG capsule Take 1 capsule (500 mg total) by mouth 4 (four) times daily. For 14 days for H.pylori 56 capsule 0     ROS Physical Exam   Blood pressure 127/85, pulse 97, temperature 98.2 F (36.8 C), temperature source Oral, resp. rate 20, height 5\' 2"  (1.575 m), weight 78.189 kg (172 lb 6 oz), last menstrual period 02/15/2014.  Physical Exam  Nursing note and vitals reviewed. Constitutional: She is oriented to person, place, and time.  She appears well-developed and well-nourished.  Cardiovascular: Normal rate.   Respiratory: Effort normal.  GI: Soft. There is no tenderness. There is no rebound.  Genitourinary:   External: no lesion Vagina: scant amount of blood  Cervix: pink, smooth, no CMT, IUD tail seen at the os, about 2cm long Uterus: NSSC Adnexa: NT   Neurological: She is alert and oriented to person, place, and time.  Skin: Skin is warm and dry.  Psychiatric: She has a normal mood and affect.    MAU Course  Procedures  Results for orders placed or performed during the hospital encounter of 02/19/14 (from the past 24 hour(s))  Urinalysis, Routine w reflex microscopic     Status: Abnormal   Collection Time: 02/19/14 11:11 PM  Result Value Ref Range   Color, Urine YELLOW YELLOW    APPearance CLEAR CLEAR   Specific Gravity, Urine 1.010 1.005 - 1.030   pH 6.0 5.0 - 8.0   Glucose, UA NEGATIVE NEGATIVE mg/dL   Hgb urine dipstick LARGE (A) NEGATIVE   Bilirubin Urine NEGATIVE NEGATIVE   Ketones, ur NEGATIVE NEGATIVE mg/dL   Protein, ur NEGATIVE NEGATIVE mg/dL   Urobilinogen, UA 0.2 0.0 - 1.0 mg/dL   Nitrite NEGATIVE NEGATIVE   Leukocytes, UA NEGATIVE NEGATIVE  Urine microscopic-add on     Status: None   Collection Time: 02/19/14 11:11 PM  Result Value Ref Range   Squamous Epithelial / LPF RARE RARE   WBC, UA 0-2 <3 WBC/hpf   RBC / HPF 11-20 <3 RBC/hpf   Bacteria, UA RARE RARE  Pregnancy, urine POC     Status: None   Collection Time: 02/19/14 11:43 PM  Result Value Ref Range   Preg Test, Ur NEGATIVE NEGATIVE     Assessment and Plan   1. IUD check up    D/W the patient at length about bleeding patterns with IUD Reassured that IUD tail is seen at the os, and this likely represents a normal position.  Encouraged to call the office with any other concerns.   Follow-up Information    Follow up with Arloa Koh, MD.   Specialty:  Obstetrics and Gynecology   Why:  As needed   Contact information:   [place] [address] [phone]        Mathis Bud 02/20/2014, 3:48 AM

## 2014-02-20 NOTE — Discharge Instructions (Signed)
Intrauterine Device Information An intrauterine device (IUD) is inserted into your uterus to prevent pregnancy. There are two types of IUDs available:   Copper IUD--This type of IUD is wrapped in copper wire and is placed inside the uterus. Copper makes the uterus and fallopian tubes produce a fluid that kills sperm. The copper IUD can stay in place for 10 years.  Hormone IUD--This type of IUD contains the hormone progestin (synthetic progesterone). The hormone thickens the cervical mucus and prevents sperm from entering the uterus. It also thins the uterine lining to prevent implantation of a fertilized egg. The hormone can weaken or kill the sperm that get into the uterus. One type of hormone IUD can stay in place for 5 years, and another type can stay in place for 3 years. Your health care provider will make sure you are a good candidate for a contraceptive IUD. Discuss with your health care provider the possible side effects.  ADVANTAGES OF AN INTRAUTERINE DEVICE  IUDs are highly effective, reversible, long acting, and low maintenance.   There are no estrogen-related side effects.   An IUD can be used when breastfeeding.   IUDs are not associated with weight gain.   The copper IUD works immediately after insertion.   The hormone IUD works right away if inserted within 7 days of your period starting. You will need to use a backup method of birth control for 7 days if the hormone IUD is inserted at any other time in your cycle.  The copper IUD does not interfere with your female hormones.   The hormone IUD can make heavy menstrual periods lighter and decrease cramping.   The hormone IUD can be used for 3 or 5 years.   The copper IUD can be used for 10 years. DISADVANTAGES OF AN INTRAUTERINE DEVICE  The hormone IUD can be associated with irregular bleeding patterns.   The copper IUD can make your menstrual flow heavier and more painful.   You may experience cramping and  vaginal bleeding after insertion.  Document Released: 01/06/2004 Document Revised: 10/04/2012 Document Reviewed: 07/23/2012 [facility] [place] Patient Information 2015 [place], [place]. This information is not intended to replace advice given to you by your health care provider. Make sure you discuss any questions you have with your health care provider.

## 2014-02-21 LAB — VITAMIN D 1,25 DIHYDROXY
Vitamin D 1, 25 (OH)2 Total: 38 pg/mL (ref 18–72)
Vitamin D2 1, 25 (OH)2: <8 pg/mL
Vitamin D3 1, 25 (OH)2: 38 pg/mL

## 2014-02-28 ENCOUNTER — Encounter: Payer: Self-pay | Admitting: Gastroenterology

## 2014-03-07 ENCOUNTER — Ambulatory Visit (INDEPENDENT_AMBULATORY_CARE_PROVIDER_SITE_OTHER): Payer: Medicaid Other | Admitting: Nurse Practitioner

## 2014-03-07 ENCOUNTER — Encounter: Payer: Self-pay | Admitting: Nurse Practitioner

## 2014-03-07 VITALS — BP 105/68 | HR 76 | Ht 62.0 in | Wt 170.0 lb

## 2014-03-07 DIAGNOSIS — K219 Gastro-esophageal reflux disease without esophagitis: Secondary | ICD-10-CM

## 2014-03-07 DIAGNOSIS — R140 Abdominal distension (gaseous): Secondary | ICD-10-CM

## 2014-03-07 DIAGNOSIS — R194 Change in bowel habit: Secondary | ICD-10-CM

## 2014-03-07 DIAGNOSIS — R768 Other specified abnormal immunological findings in serum: Secondary | ICD-10-CM

## 2014-03-07 DIAGNOSIS — R760 Raised antibody titer: Secondary | ICD-10-CM

## 2014-03-07 MED ORDER — CLARITHROMYCIN 500 MG PO TABS: 500.0000 mg | ORAL_TABLET | Freq: Two times a day (BID) | ORAL | Status: AC

## 2014-03-07 MED ORDER — AMOXICILLIN 500 MG PO CAPS
ORAL_CAPSULE | ORAL | Status: DC
Start: 2014-03-07 — End: 2014-05-21

## 2014-03-07 NOTE — Patient Instructions (Addendum)
Gas-X as needed. Citrucel daily. We sent prescriptions to [facility]. 1. Biaxin 500 mg 2. Amonicillin 500 mg  Take Citrucil daily for fiber. We have given you a Gas[facility] brochure.

## 2014-03-08 ENCOUNTER — Ambulatory Visit: Payer: BC Managed Care – PPO | Admitting: Nurse Practitioner

## 2014-03-09 ENCOUNTER — Encounter: Payer: Self-pay | Admitting: Nurse Practitioner

## 2014-03-09 DIAGNOSIS — R768 Other specified abnormal immunological findings in serum: Secondary | ICD-10-CM | POA: Insufficient documentation

## 2014-03-09 DIAGNOSIS — K219 Gastro-esophageal reflux disease without esophagitis: Secondary | ICD-10-CM | POA: Insufficient documentation

## 2014-03-09 DIAGNOSIS — R194 Change in bowel habit: Secondary | ICD-10-CM | POA: Insufficient documentation

## 2014-03-09 DIAGNOSIS — R140 Abdominal distension (gaseous): Secondary | ICD-10-CM | POA: Insufficient documentation

## 2014-03-09 NOTE — Progress Notes (Signed)
HPI :  Patient is a 29 year old female referred by PCP for GERD. Patient takes baking soda / water for occasional heartburn. PCP sent in prescription for PPI, patient has gotten it yet. H.pylori was positive but flagyl made her vomit so she only took a day or two of combination therapy. Patient mainly describes bloating and gas. She has chronic problems with altered bowel habits. Stools are either hard or loose. No blood in stool. No unusual weight loss. She was seen by [facility] in 2013 for abdominal pain. CTscan revealed abdominal hernia, patient underwent repair. Scan also showed ovarian cyst which she had removed.     Past Medical History  Diagnosis Date  . Anemia   . Hyperlipidemia     postpartum 2+yrs ago  . Hypertension     postpartum 2+yrs ago  . History of bronchitis 3+yrs ago  . History of migraine 09/20/11-last one  . GERD (gastroesophageal reflux disease)     only takes OTC meds  . Sickle cell trait   . Anxiety   . Depression     but doesn't require meds  . 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 HSV-1 (herpes simplex virus 1) infection   . HSV-2 (herpes simplex virus 2) infection   . Condyloma acuminatum of vulva   . Abdominal hernia     Family History  Problem Relation Age of Onset  . Hypertension Father    History  Substance Use Topics  . Smoking status: Former Research scientist (life sciences)  . Smokeless tobacco: Never Used  . Alcohol Use: No   Current Outpatient Prescriptions  Medication Sig Dispense Refill  . acetaminophen (TYLENOL) 500 MG tablet Take 1,000 mg by mouth every 6 (six) hours as needed. For pain    . aspirin-acetaminophen-caffeine (EXCEDRIN MIGRAINE) 250-250-65 MG per tablet Take 1 tablet by mouth daily as needed. For migraines    . levonorgestrel (MIRENA) 20 MCG/24HR IUD 1 each by Intrauterine route once.    Marland Kitchen omeprazole (PRILOSEC) 20 MG capsule Take 1 capsule (20 mg total) by mouth daily. 30 capsule 3  . amoxicillin (AMOXIL) 500 MG capsule Take 2 caps  twice daily. 56 capsule 0  . clarithromycin (BIAXIN) 500 MG tablet Take 1 tablet (500 mg total) by mouth 2 (two) times daily. 28 tablet 0   No current facility-administered medications for this visit.   Allergies  Allergen Reactions  . Percocet [Oxycodone-Acetaminophen] Itching     Review of Systems: All systems reviewed and negative except where noted in HPI.   Physical Exam: BP 105/68 mmHg  Pulse 76  Ht 5\' 2"  (1.575 m)  Wt 170 lb (77.111 kg)  BMI 31.09 kg/m2  SpO2 99%  LMP 02/15/2014 Constitutional: Pleasant, obese black female in no acute distress. HEENT: Normocephalic and atraumatic. Conjunctivae are normal. No scleral icterus. Neck supple.  Cardiovascular: Normal rate, regular rhythm.  Pulmonary/chest: Effort normal and breath sounds normal. No wheezing, rales or rhonchi. Abdominal: Soft, nondistended, nontender. Bowel sounds active throughout. There are no masses palpable. No hepatomegaly. Extremities: no edema Lymphadenopathy: No cervical adenopathy noted. Neurological: Alert and oriented to person place and time. Skin: Skin is warm and dry. No rashes noted. Psychiatric: Normal mood and affect. Behavior is normal.   ASSESSMENT AND PLAN:  24. 29 year old female with chronic bloating / gas / altered bowels which I suspect is functional.   Will start her on daily Citrucel, hopefully it won't exacerbate bloating.   Trial  of Gas-X.   Low gas brochure given.   Ask to call in few weeks with condition update   2. H.pylori ab positive. Likely incidental and not contributing to symptoms but will proceed with treatment.She couldn't tolerate flagyl  / tetracycline regimen. Will try Amoxicillin / biaxin / PPI regimen

## 2014-03-18 ENCOUNTER — Telehealth: Payer: Self-pay | Admitting: Obstetrics and Gynecology

## 2014-03-18 NOTE — Telephone Encounter (Signed)
Patient is asking to talk with Dr.Silva's nurse, wants to give info before scheduling an appointment. Last seen 02/11/14.

## 2014-03-18 NOTE — Telephone Encounter (Signed)
Spoke with patient. She has a hx of genital warts and hsv. Patient reports 3 days of feeling "a hardened bump" on labia. No drainage or pain. Would like advice on how to treat. Advised patient if this is new symptom will need office visit for evaluation.  Patient agreeable. Requests latest appointment in afternoons available, due to work schedule.  Patient scheduled for office visit with Regina Eck CNM for 03/19/14 at 1600. Will call back with any change in symptoms.  Routing to provider for final review. Patient agreeable to disposition. Will close encounter

## 2014-03-18 NOTE — Telephone Encounter (Signed)
Message left to return call to Tracy at [phone].    

## 2014-03-19 ENCOUNTER — Encounter: Payer: Self-pay | Admitting: Certified Nurse Midwife

## 2014-03-19 ENCOUNTER — Ambulatory Visit (INDEPENDENT_AMBULATORY_CARE_PROVIDER_SITE_OTHER): Payer: Medicaid Other | Admitting: Certified Nurse Midwife

## 2014-03-19 VITALS — BP 108/77 | HR 68 | Resp 16 | Ht 63.0 in | Wt 172.0 lb

## 2014-03-19 DIAGNOSIS — A6004 Herpesviral vulvovaginitis: Secondary | ICD-10-CM | POA: Diagnosis not present

## 2014-03-19 DIAGNOSIS — B3731 Acute candidiasis of vulva and vagina: Secondary | ICD-10-CM

## 2014-03-19 DIAGNOSIS — B373 Candidiasis of vulva and vagina: Secondary | ICD-10-CM

## 2014-03-19 MED ORDER — TERCONAZOLE 0.4 % VA CREA: 1.0000 | TOPICAL_CREAM | Freq: Every day | VAGINAL | Status: DC

## 2014-03-19 NOTE — Patient Instructions (Signed)
Genital Herpes °Genital herpes is a sexually transmitted disease. This means that it is a disease passed by having sex with an infected person. There is no cure for genital herpes. The time between attacks can be months to years. The virus may live in a person but produce no problems (symptoms). This infection can be passed to a baby as it travels down the birth canal (vagina). In a newborn, this can cause central nervous system damage, eye damage, or even death. The virus that causes genital herpes is usually HSV-2 virus. The virus that causes oral herpes is usually HSV-1. The diagnosis (learning what is wrong) is made through culture results. °SYMPTOMS  °Usually symptoms of pain and itching begin a few days to a week after contact. It first appears as small blisters that progress to small painful ulcers which then scab over and heal after several days. It affects the outer genitalia, birth canal, cervix, penis, anal area, buttocks, and thighs. °HOME CARE INSTRUCTIONS  °· Keep ulcerated areas dry and clean. °· Take medications as directed. Antiviral medications can speed up healing. They will not prevent recurrences or cure this infection. These medications can also be taken for suppression if there are frequent recurrences. °· While the infection is active, it is contagious. Avoid all sexual contact during active infections. °· Condoms may help prevent spread of the herpes virus. °· Practice safe sex. °· Wash your hands thoroughly after touching the genital area. °· Avoid touching your eyes after touching your genital area. °· Inform your caregiver if you have had genital herpes and become pregnant. It is your responsibility to insure a safe outcome for your baby in this pregnancy. °· Only take over-the-counter or prescription medicines for pain, discomfort, or fever as directed by your caregiver. °SEEK MEDICAL CARE IF:  °· You have a recurrence of this infection. °· You do not respond to medications and are not  improving. °· You have new sources of pain or discharge which have changed from the original infection. °· You have an oral temperature above 102° F (38.9° C). °· You develop abdominal pain. °· You develop eye pain or signs of eye infection. °Document Released: 01/30/2000 Document Revised: 04/26/2011 Document Reviewed: 02/19/2009 °ExitCare® Patient Information ©2015 [employer]. This information is not intended to replace advice given to you by your health care provider. Make sure you discuss any questions you have with your health care provider. ° °

## 2014-03-19 NOTE — Progress Notes (Signed)
29 y.o. single african [place] female [id number]  here with complaint of vaginal symptoms of itching, burning, and increase discharge. Describes discharge as white thick.. Onset of symptoms 7 days ago. 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 ? Not sure what is there, tender and itching. Currently being treated with for H.Pylori with antibiotics. Denies new personal products or UTI symptoms.  No STD concerns. Contraception Mirena IUD.   O:Healthy female WDWN Affect: normal, orientation x 3  Exam: Abdomen:soft, non tender Lymph node: no enlargement or tenderness Pelvic exam: External genital: normal female with herpes blisters noted at 9 o'clock on right vulva BUS: negative Vagina: white thick non odorous discharge noted. Ph: 4.0   ,Wet prep taken Cervix: normal, non tender IUD string noted. Uterus: normal, non tender Adnexa:normal, non tender, no masses or fullness noted   Wet Prep results:yeast   A:HSV2 outbreak Yeast vaginitis H pylori under treatement   P:Discussed findings of yeast and HSV blisters and etiology. Discussed Aveeno or baking soda sitz bath for comfort. Rx Terazol 7 vaginal cream see order Discussed Valtrex use for HSV, patient prefers OTC Lysine and hydrocortisone, which works best for her. Discussed oral probiotic to improve immune status. Questions addressed.  Rv prn

## 2014-03-20 NOTE — Progress Notes (Signed)
Reviewed personally.  M. Suzanne Miller, MD.  

## 2014-03-22 ENCOUNTER — Telehealth: Payer: Self-pay | Admitting: Certified Nurse Midwife

## 2014-03-22 MED ORDER — ACYCLOVIR 5 % EX OINT: 1.0000 "application " | TOPICAL_OINTMENT | CUTANEOUS | Status: DC

## 2014-03-22 NOTE — Telephone Encounter (Signed)
Spoke with patient. Advised of message as seen below from [facility]. Patient is agreeable and verbalizes understanding. Rx for Acyclovir ointment #1 0RF sent to [facility] on file. Patient is agreeable.  Routing to provider for final review. Patient agreeable to disposition. Will close encounter

## 2014-03-22 NOTE — Telephone Encounter (Signed)
Patient is asking for an "ointment" for hsv outbreak. Patient was lasts seen 03/19/14. Confirmed pharmacy on file.

## 2014-03-22 NOTE — Telephone Encounter (Signed)
Spoke with patient. Patient states that she is having increased discomfort from HSV outbreak. Was seen with Regina Eck CNM on 03/19/2014. Patient is requesting ointment for relief. Advised patient would send a message over to Regina Eck CNM and return call with further recommendations. Patient is agreeable.

## 2014-03-22 NOTE — Telephone Encounter (Signed)
Ok to Acyclovir ointment Rx and use hydrocortisone 1/2 % mix to area. Aveeno sitz bath for comfort.

## 2014-05-20 ENCOUNTER — Telehealth: Payer: Self-pay | Admitting: Obstetrics and Gynecology

## 2014-05-20 NOTE — Telephone Encounter (Signed)
Pt is having some cramping and symptoms of a yeast infection.

## 2014-05-20 NOTE — Telephone Encounter (Signed)
Spoke with patient. Patient states that she is experiencing left lower quadrant pain. Pain is constant and feels like "aching." "It hurts more when I walk than anything. It is just really uncomfortable." Patient has IUD. Has not tried to feel strings. "I feel like I may have a yeast infection also." Patient has been taking ibuprofen with relief. Discomfort began on 3/17. Advised will need to be seen for evaluation. Patient is agreeable. Requesting appointment for tomorrow with Dr.Silva or Regina Eck CNM. Advised Dr.Silva will be out of the office tomorrow. Appointment scheduled for tomorrow at 12:45pm with Regina Eck CNM. Patient is agreeable to date and time.  Routing to provider for final review. Patient agreeable to disposition. Will close encounter

## 2014-05-21 ENCOUNTER — Encounter: Payer: Self-pay | Admitting: Certified Nurse Midwife

## 2014-05-21 ENCOUNTER — Ambulatory Visit (INDEPENDENT_AMBULATORY_CARE_PROVIDER_SITE_OTHER): Payer: Medicaid Other | Admitting: Certified Nurse Midwife

## 2014-05-21 ENCOUNTER — Ambulatory Visit: Payer: Medicaid Other | Admitting: Certified Nurse Midwife

## 2014-05-21 VITALS — BP 124/72 | HR 80 | Temp 98.3°F | Ht 63.0 in | Wt 172.6 lb

## 2014-05-21 DIAGNOSIS — Z8719 Personal history of other diseases of the digestive system: Secondary | ICD-10-CM

## 2014-05-21 DIAGNOSIS — R102 Pelvic and perineal pain: Secondary | ICD-10-CM

## 2014-05-21 LAB — CBC WITH DIFFERENTIAL/PLATELET
Basophils Absolute: 0.1 10*3/uL (ref 0.0–0.1)
Basophils Relative: 1 % (ref 0–1)
Eosinophils Absolute: 0.1 10*3/uL (ref 0.0–0.7)
Eosinophils Relative: 2 % (ref 0–5)
HCT: 40.7 % (ref 36.0–46.0)
Hemoglobin: 13.5 g/dL (ref 12.0–15.0)
Lymphocytes Relative: 44 % (ref 12–46)
Lymphs Abs: 3.3 10*3/uL (ref 0.7–4.0)
MCH: 28.4 pg (ref 26.0–34.0)
MCHC: 33.2 g/dL (ref 30.0–36.0)
MCV: 85.7 fL (ref 78.0–100.0)
MPV: 9 fL (ref 8.6–12.4)
Monocytes Absolute: 0.5 10*3/uL (ref 0.1–1.0)
Monocytes Relative: 7 % (ref 3–12)
Neutro Abs: 3.4 10*3/uL (ref 1.7–7.7)
Neutrophils Relative %: 46 % (ref 43–77)
Platelets: 385 10*3/uL (ref 150–400)
RBC: 4.75 MIL/uL (ref 3.87–5.11)
RDW: 13.9 % (ref 11.5–15.5)
WBC: 7.4 10*3/uL (ref 4.0–10.5)

## 2014-05-21 LAB — POCT URINALYSIS DIPSTICK
Leukocytes, UA: NEGATIVE
Urobilinogen, UA: NEGATIVE
pH, UA: 5

## 2014-05-21 NOTE — Patient Instructions (Signed)
Diet and Irritable Bowel Syndrome  No cure has been found for irritable bowel syndrome (IBS). Many options are available to treat the symptoms. Your caregiver will give you the best treatments available for your symptoms. He or she will also encourage you to manage stress and to make changes to your diet. You need to work with your caregiver and Registered Dietician to find the best combination of medicine, diet, counseling, and support to control your symptoms. The following are some diet suggestions. FOODS THAT MAKE IBS WORSE  Fatty foods, such as [place] fries.  Milk products, such as cheese or ice cream.  Chocolate.  Alcohol.  Caffeine (found in coffee and some sodas).  Carbonated drinks, such as soda. If certain foods cause symptoms, you should eat less of them or stop eating them. FOOD JOURNAL   Keep a journal of the foods that seem to cause distress. Write down:  What you are eating during the day and when.  What problems you are having after eating.  When the symptoms occur in relation to your meals.  What foods always make you feel badly.  Take your notes with you to your caregiver to see if you should stop eating certain foods. FOODS THAT MAKE IBS BETTER Fiber reduces IBS symptoms, especially constipation, because it makes stools soft, bulky, and easier to pass. Fiber is found in bran, bread, cereal, beans, fruit, and vegetables. Examples of foods with fiber include:  Apples.  Peaches.  Pears.  Berries.  Figs.  Broccoli, raw.  Cabbage.  Carrots.  Raw peas.  Kidney beans.  Lima beans.  Whole-grain bread.  Whole-grain cereal. Add foods with fiber to your diet a little at a time. This will let your body get used to them. Too much fiber at once might cause gas and swelling of your abdomen. This can trigger symptoms in a person with IBS. Caregivers usually recommend a diet with enough fiber to produce soft, painless bowel movements. High fiber diets may  cause gas and bloating. However, these symptoms often go away within a few weeks, as your body adjusts. In many cases, dietary fiber may lessen IBS symptoms, particularly constipation. However, it may not help pain or diarrhea. High fiber diets keep the colon mildly enlarged (distended) with the added fiber. This may help prevent spasms in the colon. Some forms of fiber also keep water in the stool, thereby preventing hard stools that are difficult to pass.  Besides telling you to eat more foods with fiber, your caregiver may also tell you to get more fiber by taking a fiber pill or drinking water mixed with a special high fiber powder. An example of this is a natural fiber laxative containing psyllium seed.  TIPS  Large meals can cause cramping and diarrhea in people with IBS. If this happens to you, try eating 4 or 5 small meals a day, or try eating less at each of your usual 3 meals. It may also help if your meals are low in fat and high in carbohydrates. Examples of carbohydrates are pasta, rice, whole-grain breads and cereals, fruits, and vegetables.  If dairy products cause your symptoms to flare up, you can try eating less of those foods. You might be able to handle yogurt better than other dairy products, because it contains bacteria that helps with digestion. Dairy products are an important source of calcium and other nutrients. If you need to avoid dairy products, be sure to talk with a Registered Dietitian about getting these nutrients  through other food sources.  Drink enough water and fluids to keep your urine clear or pale yellow. This is important, especially if you have diarrhea. FOR MORE INFORMATION  International Foundation for Functional Gastrointestinal Disorders: www.iffgd.org  National Digestive Diseases Information Clearinghouse: digestive.AmenCredit.is Document Released: 04/24/2003 Document Revised: 04/26/2011 Document Reviewed: 05/04/2013 [facility] Patient Information 2015  [place], [place]. This information is not intended to replace advice given to you by your health care provider. Make sure you discuss any questions you have with your health care provider.

## 2014-05-21 NOTE — Progress Notes (Signed)
29 y.o. Single african american female  [phone] here for complaint of LLQ pain that is mild and  started again after 3 bowel movements. Patient has history of IBS and is working on diet control. Denies vomiting, fever, chills, urinary frequency, urgency or pain. Denies blood in stool or dark tarry stool. Patient had history of pain in LLQ with endometrial biopsy and PUS work up on 09/13/13 which was negative. Patient has Mirena IUD inserted on 10/31/13 which is working well. Scant to no period. Patient also has noted a vaginal odor for the past 2 weeks, no itching or burning or increase in discharge. No STD concerns, not sexually active. No other health concerns today.  ROS:  Pertinent to HPI O: Healthy WDWN female Orientation x 3, affect normal  Exam:   BP 124/72 mmHg  Temp(Src) 98.3 F (36.8 C) (Oral)  Ht 5\' 3"  (1.6 m)  Wt 172 lb 9.6 oz (78.291 kg)  BMI 30.58 kg/m2  LMP 02/16/2014 General appearance: alert, cooperative and no distress  Skin: warm and dry CV:  NSSR Lungs:clear to auscultation bilaterally  CVAT bilateral non tender Abdomen:  soft, non-tender; bowel sounds normal; no masses,  no organomegaly No point of pain identified, negative suprapubic Lymph:  No enlarged or tender inguinal lymph nodes   Pelvic: External genitalia:  no lesions and normal escutcheon              Urethra: normal appearing urethra with no masses, tenderness or lesions and urethral meatus and bladder nontender              Bartholins and Skenes: normal              Vagina: normal appearing vagina with normal color and discharge, no lesions, scant discharge noted with odor, affirm taken              Cervix: normal appearance and no CMT IUD string noted              Pap taken: No. Bimanual Exam:  Uterus:  uterus is normal size, shape, consistency and nontender                               Adnexa:    normal adnexa in size, nontender and no masses and no masses                               Rectovaginal:  Confirms                               Anus:  deferred   A:Normal pelvic exam History of IBS, feel pain related to GI,not GYN  Vaginal odor Contraception Mirena IUD removal due 2020  P:Discussed finding of normal pelvic exam and no masses palpated. Patient felt reassured. Discussed intestinal area in same close area of ovaries. Patient feels " now that she considers this that the pain comes after she has flare with IBS. Encouraged to follow up with GI regarding. Consider starting on oral refrigerated probiotic. Given information. Lab CBC with diff Warnings of abdominal pain given. Discussed vaginal odor noted and will treat as indicated when affirm results in.   RV prn       An After Visit Summary was printed and given to the patient.

## 2014-05-22 LAB — WET PREP BY MOLECULAR PROBE
Candida species: NEGATIVE
Gardnerella vaginalis: NEGATIVE
Trichomonas vaginosis: NEGATIVE

## 2014-05-23 NOTE — Progress Notes (Signed)
Reviewed personally.  M. Suzanne Miller, MD.  

## 2014-06-24 ENCOUNTER — Telehealth: Payer: Self-pay | Admitting: Obstetrics and Gynecology

## 2014-06-24 NOTE — Telephone Encounter (Signed)
Spoke with patient. Patient states that since having her IUD inserted she has been experiencing LLQ that she has been seen with Regina Eck CNM for on 04.05.2016. States that pain is intermittent. Not currently having any discomfort. Patient states over the last week her breasts have started to hurt. "They are really sore and painful into my armpit on both sides." Denies swelling, redness, and warmth to breast. Denies fever or chills as well. Advised patient will need to be seen in office for further evaluation. Patient is agreeable. Requesting to be seen with Dr.Silva. Appointment scheduled for first available on 5/16 at 1pm with Dr.Silva. Patient placed on wait list as well. Advised if symptoms worsen will need to be seen sooner. Patient is agreeable and verbalizes understanding.  Routing to provider for final review. Patient agreeable to disposition. Patient aware provider will review message and nurse will return call with any additional instructions or change of disposition. Will close encounter.

## 2014-06-24 NOTE — Telephone Encounter (Signed)
Patient having breast pain in both breasts and other symptoms with the iud.

## 2014-06-27 ENCOUNTER — Telehealth: Payer: Self-pay | Admitting: Obstetrics and Gynecology

## 2014-07-01 ENCOUNTER — Encounter: Payer: Self-pay | Admitting: Obstetrics and Gynecology

## 2014-07-01 ENCOUNTER — Ambulatory Visit (INDEPENDENT_AMBULATORY_CARE_PROVIDER_SITE_OTHER): Payer: Medicaid Other | Admitting: Obstetrics and Gynecology

## 2014-07-01 VITALS — BP 110/78 | HR 70 | Ht 63.0 in | Wt 169.0 lb

## 2014-07-01 DIAGNOSIS — L299 Pruritus, unspecified: Secondary | ICD-10-CM | POA: Diagnosis not present

## 2014-07-01 DIAGNOSIS — N6320 Unspecified lump in the left breast, unspecified quadrant: Secondary | ICD-10-CM

## 2014-07-01 DIAGNOSIS — N63 Unspecified lump in breast: Secondary | ICD-10-CM

## 2014-07-01 NOTE — Progress Notes (Signed)
Patient ID: Toni Warren, female   DOB: 1985/06/24, 29 y.o.   MRN: [id number] GYNECOLOGY  VISIT   HPI: 29 y.o.   Single  African American  female   [phone] with Patient's last menstrual period was 06/29/2014.   here for possible left breast lump and some breast discomfort.  Noticed the lump 2 weeks ago.  No change in size.  No trauma to breast or insect bites.  Having a menses now.  Ibuprofen for cramping.   Mother and maternal grandmother with has fibrocystic change. Maternal great aunt with breast cancer.   Wondered with skin itching is due to her Mirena.  Avoiding products.   GYNECOLOGIC HISTORY: Patient's last menstrual period was 06/29/2014. Contraception:Mirena IUD--inserted 10-31-13 Menopausal hormone therapy: n/a Last mammogram: n/a Last pap smear: 08-03-13 wnl:neg HR HPV        OB History    Gravida Para Term Preterm AB TAB SAB Ectopic Multiple Living   3 1   1 1    2          Patient Active Problem List   Diagnosis Date Noted  . GERD (gastroesophageal reflux disease) 03/09/2014  . Altered bowel habits 03/09/2014  . Bloating 03/09/2014  . Helicobacter pylori ab+ 03/09/2014  . 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 Menorrhagia 03/29/2013  . General counseling for prescription of oral contraceptives 03/29/2013  . Abdominal hernia 09/29/2011    Past Medical History  Diagnosis Date  . Anemia   . Hyperlipidemia     postpartum 2+yrs ago  . Hypertension     postpartum 2+yrs ago  . History of bronchitis 3+yrs ago  . History of migraine 09/20/11-last one  . GERD (gastroesophageal reflux disease)     only takes OTC meds  . Sickle cell trait   . Anxiety   . Depression     but doesn't require meds  . 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 HSV-1 (herpes simplex virus 1) infection   . HSV-2 (herpes simplex virus 2) infection   . Condyloma acuminatum of vulva   . Abdominal hernia     Past Surgical History  Procedure Laterality Date  . Ovarian cyst  removal  2012  . Eye surgery  1990's  . Ventral hernia repair  09/29/2011    Procedure: HERNIA REPAIR VENTRAL ADULT;  Surgeon: Joyice Faster. Cornett, MD;  Location: [place];  Service: General;  Laterality: N/A;  . Abdominoplasty  09/29/2011    Procedure: ABDOMINOPLASTY;  Surgeon: Theodoro Kos, DO;  Location: [place];  Service: Plastics;  Laterality: N/A;  ABDOMINOPLASTY FOR REPAIR OF RECTUS DIASTASIS    Current Outpatient Prescriptions  Medication Sig Dispense Refill  . acetaminophen (TYLENOL) 500 MG tablet Take 1,000 mg by mouth every 6 (six) hours as needed. For pain    . aspirin-acetaminophen-caffeine (EXCEDRIN MIGRAINE) 250-250-65 MG per tablet Take 1 tablet by mouth daily as needed. For migraines    . levonorgestrel (MIRENA) 20 MCG/24HR IUD 1 each by Intrauterine route once.    Marland Kitchen omeprazole (PRILOSEC) 20 MG capsule Take 1 capsule (20 mg total) by mouth daily. (Patient taking differently: Take 20 mg by mouth as needed. ) 30 capsule 3   No current facility-administered medications for this visit.     ALLERGIES: Percocet  Family History  Problem Relation Age of Onset  . Hypertension Father     History   Social History  . Marital Status: Single    Spouse Name: N/A  .  Number of Children: N/A  . Years of Education: N/A   Occupational History  . Not on file.   Social History Main Topics  . Smoking status: Former Research scientist (life sciences)  . Smokeless tobacco: Never Used  . Alcohol Use: No  . Drug Use: No  . Sexual Activity:    Partners: Male    Birth Control/ Protection: IUD     Comment: Mirena IUD inserted 10-31-13   Other Topics Concern  . Not on file   Social History Narrative    ROS:  Pertinent items are noted in HPI.  PHYSICAL EXAMINATION:    BP 110/78 mmHg  Pulse 70  Ht 5\' 3"  (1.6 m)  Wt 169 lb (76.658 kg)  BMI 29.94 kg/m2  LMP 06/29/2014    General appearance: alert, cooperative and appears stated age   Breasts: normal appearance, no masses or tenderness, Inspection negative,  No nipple retraction or dimpling, No nipple discharge or bleeding, No axillary or supraclavicular adenopathy on right.  Left breast with thickening at 2:00.  No retractions, nipple discharge, or axillary adenopathy.   Chaperone was present for exam.  ASSESSMENT  Left breast pain and thickening.  Mirena IUD patient.  Pruritis.  Not likely due to Mirena.  PLAN  Counseled regarding breast pain and mass. See orders yes.  Left breast ultrasound at [facility]. Return in prn.    An After Visit Summary was printed and given to the patient.  ___15___ minutes face to face time of which over 50% was spent in counseling.

## 2014-07-01 NOTE — Progress Notes (Signed)
Appointment scheduled for patient while in the office for left breast ultrasound at [facility] for 5/20 at 1pm. Patient is agreeable to appointment date and time.

## 2014-07-05 ENCOUNTER — Ambulatory Visit
Admission: RE | Admit: 2014-07-05 | Discharge: 2014-07-05 | Disposition: A | Discharge status: Home / Self Care | Payer: Medicaid Other | Source: Ambulatory Visit | Attending: Obstetrics and Gynecology | Admitting: Obstetrics and Gynecology

## 2014-07-05 DIAGNOSIS — N6320 Unspecified lump in the left breast, unspecified quadrant: Secondary | ICD-10-CM

## 2014-07-11 NOTE — Telephone Encounter (Signed)
done

## 2014-08-29 ENCOUNTER — Encounter: Payer: Self-pay | Admitting: Obstetrics and Gynecology

## 2014-08-29 ENCOUNTER — Ambulatory Visit (INDEPENDENT_AMBULATORY_CARE_PROVIDER_SITE_OTHER): Payer: Medicaid Other | Admitting: Obstetrics and Gynecology

## 2014-08-29 VITALS — BP 122/76 | HR 84 | Resp 16 | Ht 63.0 in | Wt 163.0 lb

## 2014-08-29 DIAGNOSIS — Z113 Encounter for screening for infections with a predominantly sexual mode of transmission: Secondary | ICD-10-CM

## 2014-08-29 DIAGNOSIS — N9089 Other specified noninflammatory disorders of vulva and perineum: Secondary | ICD-10-CM | POA: Diagnosis not present

## 2014-08-29 DIAGNOSIS — Z0000 Encounter for general adult medical examination without abnormal findings: Secondary | ICD-10-CM | POA: Diagnosis not present

## 2014-08-29 DIAGNOSIS — Z01419 Encounter for gynecological examination (general) (routine) without abnormal findings: Secondary | ICD-10-CM

## 2014-08-29 LAB — POCT URINALYSIS DIPSTICK
Bilirubin, UA: NEGATIVE
Blood, UA: NEGATIVE
Glucose, UA: NEGATIVE
Ketones, UA: NEGATIVE
Leukocytes, UA: NEGATIVE
Nitrite, UA: NEGATIVE
Protein, UA: NEGATIVE
Urobilinogen, UA: NEGATIVE
pH, UA: 5

## 2014-08-29 MED ORDER — VALACYCLOVIR HCL 500 MG PO TABS: 500.0000 mg | ORAL_TABLET | Freq: Every day | ORAL | Status: DC

## 2014-08-29 NOTE — Progress Notes (Signed)
29 y.o. [id number] Single African American female here for annual exam.    Seen on 07/01/14 for left breast mass.  Noted to have thickening at 2:00. Had normal left breast ultrasound 07/05/14. Left breast feels normal now.  No pain either.   Having bleeding with intercourse.  Occurring consistently.  Had sex twice this year and had bleeding each time.  No dyspareunia.   New partner.  Uses condoms.  Hx HSV.  Not frequent outbreaks.   LMP 07/01/14.  Skin itching resolved.   Some leakage of urine with sneeze, cough, exercise.   Had treatment for condyloma.  Has a sensitive area of the right.   PCP did routine labs in January 2016.   PCP:  Garnet Koyanagi   No LMP recorded. Patient is not currently having periods (Reason: IUD).          Sexually active: Yes.    The current method of family planning is IUD.    Exercising: Yes.    Walking Smoker:  no  Health Maintenance: Pap:  08/03/13 Neg. HR HPV:neg History of abnormal Pap:  Yes, 2005 MMG:  [place] left 07/05/14 BIRADS1:Neg TDaP:  2012? Screening Labs:  Hb today: 13.5, Urine today: Clear   reports that she has quit smoking. She has never used smokeless tobacco. She reports that she drinks alcohol. She reports that she does not use illicit drugs.  Past Medical History  Diagnosis Date  . Anemia   . Hyperlipidemia     postpartum 2+yrs ago  . Hypertension     postpartum 2+yrs ago  . History of bronchitis 3+yrs ago  . History of migraine 09/20/11-last one  . GERD (gastroesophageal reflux disease)     only takes OTC meds  . Sickle cell trait   . Anxiety   . Depression     but doesn't require meds  . 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 HSV-1 (herpes simplex virus 1) infection   . HSV-2 (herpes simplex virus 2) infection   . Condyloma acuminatum of vulva   . Abdominal hernia     Past Surgical History  Procedure Laterality Date  . Ovarian cyst removal  2012  . Eye surgery  1990's  . Ventral hernia repair  09/29/2011     Procedure: HERNIA REPAIR VENTRAL ADULT;  Surgeon: Joyice Faster. Cornett, MD;  Location: [place];  Service: General;  Laterality: N/A;  . Abdominoplasty  09/29/2011    Procedure: ABDOMINOPLASTY;  Surgeon: Theodoro Kos, DO;  Location: [facility];  Service: Plastics;  Laterality: N/A;  ABDOMINOPLASTY FOR REPAIR OF RECTUS DIASTASIS    Current Outpatient Prescriptions  Medication Sig Dispense Refill  . acetaminophen (TYLENOL) 500 MG tablet Take 1,000 mg by mouth every 6 (six) hours as needed. For pain    . aspirin-acetaminophen-caffeine (EXCEDRIN MIGRAINE) 250-250-65 MG per tablet Take 1 tablet by mouth daily as needed. For migraines    . B Complex Vitamins (B COMPLEX 100 PO) Take by mouth daily.    Marland Kitchen levonorgestrel (MIRENA) 20 MCG/24HR IUD 1 each by Intrauterine route once.    Marland Kitchen omeprazole (PRILOSEC) 20 MG capsule Take 1 capsule (20 mg total) by mouth daily. (Patient taking differently: Take 20 mg by mouth as needed. ) 30 capsule 3   No current facility-administered medications for this visit.    Family History  Problem Relation Age of Onset  . Hypertension Father     ROS:  Pertinent items are noted in HPI.  Otherwise,  a comprehensive ROS was negative.  Exam:   BP 122/76 mmHg  Pulse 84  Resp 16  Ht 5\' 3"  (1.6 m)  Wt 163 lb (73.936 kg)  BMI 28.88 kg/m2    General appearance: alert, cooperative and appears stated age Head: Normocephalic, without obvious abnormality, atraumatic Neck: no adenopathy, supple, symmetrical, trachea midline and thyroid normal to inspection and palpation Lungs: clear to auscultation bilaterally Breasts: normal appearance, no masses or tenderness, Inspection negative, No nipple retraction or dimpling, No nipple discharge or bleeding, No axillary or supraclavicular adenopathy Heart: regular rate and rhythm Abdomen: abdominoplasty incision, soft, non-tender; bowel sounds normal; no masses,  no organomegaly Extremities: extremities normal, atraumatic, no cyanosis or  edema Skin: Skin color, texture, turgor normal. No rashes or lesions Lymph nodes: Cervical, supraclavicular, and axillary nodes normal. No abnormal inguinal nodes palpated Neurologic: Grossly normal  Pelvic: External genitalia:  1 cm area of hypopigmentation of the right labia major inferiorly, tender to touch.   No condyloma seen.                Urethra:  normal appearing urethra with no masses, tenderness or lesions              Bartholins and Skenes: normal                 Vagina: normal appearing vagina with normal color and discharge, no lesions              Cervix: no lesions.   IUD strings noted.  First degree prolapse noted.              Pap taken: No. Bimanual Exam:  Uterus:  normal size, contour, position, consistency, mobility, non-tender              Adnexa: normal adnexa and no mass, fullness, tenderness              Rectovaginal: No..    Chaperone was present for exam.  Assessment:   Well woman visit with normal exam. Hx HSV I and II.  Mirena IUD patient.  Post coital bleeding.  Mild uterine prolapse. Hypopigmentation of the inferior labia majora. Status post treatment of vulvar condyloma.  Genuine stress incontinence.   Plan: Yearly mammogram recommended after age 49.  Recommended self breast exam.  Pap and HR HPV as above. Labs performed.  Yes.  .   See orders for STD testing.  Refills given on medications.  No..   Discussed Kegel exercises.  Return for vulvar biopsy.   Follow up annually and prn.   After visit summary to patient.

## 2014-08-30 LAB — HEPATITIS C ANTIBODY: HCV Ab: NEGATIVE

## 2014-08-30 LAB — STD PANEL
HIV 1&2 Ab, 4th Generation: NONREACTIVE
Hepatitis B Surface Ag: NEGATIVE

## 2014-08-30 LAB — HEMOGLOBIN, FINGERSTICK: Hemoglobin, fingerstick: 13.5 g/dL (ref 12.0–16.0)

## 2014-08-31 LAB — IPS N GONORRHOEA AND CHLAMYDIA BY PCR

## 2014-09-01 ENCOUNTER — Encounter: Payer: Self-pay | Admitting: Obstetrics and Gynecology

## 2014-09-10 ENCOUNTER — Telehealth: Payer: Self-pay | Admitting: Obstetrics and Gynecology

## 2014-09-10 NOTE — Telephone Encounter (Signed)
Spoke with patient. Patient would like to schedule vulvar biopsy at this time. Appointment scheduled for 7/29 at 10am with Dr.Silva. Patient is agreeable to date and time. Order has been placed and will need precert.  Cc: Theresia Lo  Routing to provider for final review. Patient agreeable to disposition. Will close encounter.   Patient aware provider will review message and nurse will return call if any additional advice or change of disposition.

## 2014-09-10 NOTE — Telephone Encounter (Signed)
Patient says someone was going to schedule a biopsy for her.

## 2014-09-13 ENCOUNTER — Ambulatory Visit (INDEPENDENT_AMBULATORY_CARE_PROVIDER_SITE_OTHER): Payer: Medicaid Other | Admitting: Obstetrics and Gynecology

## 2014-09-13 ENCOUNTER — Encounter: Payer: Self-pay | Admitting: Obstetrics and Gynecology

## 2014-09-13 DIAGNOSIS — N9089 Other specified noninflammatory disorders of vulva and perineum: Secondary | ICD-10-CM | POA: Diagnosis not present

## 2014-09-13 NOTE — Progress Notes (Signed)
Subjective:     Patient ID: Toni Warren, female   DOB: [birth year], 29 y.o.   MRN: [id number]  HPI  Patient here today for vulvar biopsy. Has had treatment for vulvar condyloma.  Hypopigmented area of right vulva now.   Review of Systems  LMP: 06-29-14 (not having cycles most months with Mirena) Contraception: Mirena IUD inserted 10-31-13      Objective:   Physical Exam  Genitourinary:     procedure - Vulvar biopsy. Consent for procedure. Hibiclens prep. 1% lidocaine - lot [id number], exp 02/16/2015 3 mm punch biopsy.  Tissue to pathology.  AgNO3 placed.  Good hemostasis.  No complications.     Assessment:     Vulvar lesion with hypopigmentation.  Hx condyloma.    Plan:     Discussion of vulvar lesions and potential etiologies. Follow up biopsy results.  Instructions/precautions given.   After visit summary to patient.

## 2014-09-13 NOTE — Patient Instructions (Signed)

## 2014-09-17 LAB — IPS OTHER TISSUE BIOPSY

## 2014-09-19 ENCOUNTER — Telehealth: Payer: Self-pay | Admitting: Emergency Medicine

## 2014-09-19 NOTE — Telephone Encounter (Signed)
Message left to return call to Tracy at [phone].    

## 2014-09-19 NOTE — Telephone Encounter (Signed)
Patient returned call and message from Dr. Quincy Simmonds given. Patient verbalized understanding of results and will follow up as needed. Routing to provider for final review. Patient agreeable to disposition. Will close encounter.

## 2014-09-19 NOTE — Telephone Encounter (Signed)
-----   Message from Nunzio Cobbs, MD sent at 09/18/2014  9:08 PM EDT ----- Please report result of vulvar biopsy to patient showing dermatitis. There is no sign of condyloma, dysplasia, or cancer seen.  No further treatment is needed.  Cc- Marisa Sprinkles

## 2015-01-05 ENCOUNTER — Encounter (HOSPITAL_COMMUNITY): Payer: Self-pay | Admitting: Emergency Medicine

## 2015-01-05 ENCOUNTER — Emergency Department (HOSPITAL_COMMUNITY)
Admission: EM | Admit: 2015-01-05 | Discharge: 2015-01-05 | Disposition: A | Discharge status: Home / Self Care | Payer: Medicaid Other | Attending: Emergency Medicine | Admitting: Emergency Medicine

## 2015-01-05 DIAGNOSIS — K219 Gastro-esophageal reflux disease without esophagitis: Secondary | ICD-10-CM | POA: Insufficient documentation

## 2015-01-05 DIAGNOSIS — Z8709 Personal history of other diseases of the respiratory system: Secondary | ICD-10-CM | POA: Insufficient documentation

## 2015-01-05 DIAGNOSIS — Z8639 Personal history of other endocrine, nutritional and metabolic disease: Secondary | ICD-10-CM | POA: Insufficient documentation

## 2015-01-05 DIAGNOSIS — I10 Essential (primary) hypertension: Secondary | ICD-10-CM | POA: Insufficient documentation

## 2015-01-05 DIAGNOSIS — M722 Plantar fascial fibromatosis: Secondary | ICD-10-CM | POA: Insufficient documentation

## 2015-01-05 DIAGNOSIS — Z87891 Personal history of nicotine dependence: Secondary | ICD-10-CM | POA: Insufficient documentation

## 2015-01-05 DIAGNOSIS — G43909 Migraine, unspecified, not intractable, without status migrainosus: Secondary | ICD-10-CM | POA: Insufficient documentation

## 2015-01-05 DIAGNOSIS — Z79899 Other long term (current) drug therapy: Secondary | ICD-10-CM | POA: Insufficient documentation

## 2015-01-05 DIAGNOSIS — Z862 Personal history of diseases of the blood and blood-forming organs and certain disorders involving the immune mechanism: Secondary | ICD-10-CM | POA: Insufficient documentation

## 2015-01-05 DIAGNOSIS — Z8659 Personal history of other mental and behavioral disorders: Secondary | ICD-10-CM | POA: Insufficient documentation

## 2015-01-05 DIAGNOSIS — Z7982 Long term (current) use of aspirin: Secondary | ICD-10-CM | POA: Insufficient documentation

## 2015-01-05 NOTE — ED Provider Notes (Signed)
CSN: [id number]     Arrival date & time 01/05/15  2123 History  By signing my name below, I, Toni Warren, attest that this documentation has been prepared under the direction and in the presence of Toni Warren, [place].  Electronically Signed: Hilda Warren, ED Scribe. 01/05/2015. 9:44 PM.    Chief Complaint  Patient presents with  . Foot Pain      The history is provided by the patient. No language interpreter was used.   HPI Comments: Toni Warren is a 29 y.o. female who presents to the Emergency Department complaining of constant, worsening right heel and foot pain to the underside of her foot that has been present for three days. Pt reports she has intermittent flare-ups of plantar-fascitis, and states that her pain now is similar to when she had pain from plantar-fascitis previously. Pt reports she can barely walk without pain, and states that she had to leave work three days ago because she was in so much pain while walking around. Pt denies any known injury.    Past Medical History  Diagnosis Date  . Anemia   . Hyperlipidemia     postpartum 2+yrs ago  . Hypertension     postpartum 2+yrs ago  . History of bronchitis 3+yrs ago  . History of migraine 09/20/11-last one  . GERD (gastroesophageal reflux disease)     only takes OTC meds  . Sickle cell trait (Toni Warren)   . Anxiety   . Depression     but doesn't require meds  . 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 HSV-1 (herpes simplex virus 1) infection   . HSV-2 (herpes simplex virus 2) infection   . Condyloma acuminatum of vulva   . Abdominal hernia    Past Surgical History  Procedure Laterality Date  . Ovarian cyst removal  2012  . Eye surgery  1990's  . Ventral hernia repair  09/29/2011    Procedure: HERNIA REPAIR VENTRAL ADULT;  Surgeon: Toni Warren. Cornett, MD;  Location: [place];  Service: General;  Laterality: N/A;  . Abdominoplasty  09/29/2011    Procedure: ABDOMINOPLASTY;  Surgeon: Toni Kos, DO;  Location: [facility];  Service: Plastics;  Laterality: N/A;  ABDOMINOPLASTY FOR REPAIR OF RECTUS DIASTASIS   Family History  Problem Relation Age of Onset  . Hypertension Father    Social History  Substance Use Topics  . Smoking status: Former Research scientist (life sciences)  . Smokeless tobacco: Never Used  . Alcohol Use: 0.0 oz/week    0 Standard drinks or equivalent per week   OB History    Gravida Para Term Preterm AB TAB SAB Ectopic Multiple Living   3 2 2  0 1 1 0 0 0 2     Review of Systems  Musculoskeletal: Positive for arthralgias and gait problem.  All other systems reviewed and are negative.     Allergies  Percocet  Home Medications   Prior to Admission medications   Medication Sig Start Date End Date Taking? Authorizing Provider  acetaminophen (TYLENOL) 500 MG tablet Take 1,000 mg by mouth every 6 (six) hours as needed. For pain    Historical Provider, MD  aspirin-acetaminophen-caffeine (EXCEDRIN MIGRAINE) [phone] MG per tablet Take 1 tablet by mouth daily as needed. For migraines    Historical Provider, MD  B Complex Vitamins (B COMPLEX 100 PO) Take by mouth daily.    Historical Provider, MD  levonorgestrel (MIRENA) 20 MCG/24HR IUD 1 each by Intrauterine route once.  Historical Provider, MD  omeprazole (PRILOSEC) 20 MG capsule Take 1 capsule (20 mg total) by mouth daily. Patient taking differently: Take 20 mg by mouth as needed.  02/18/14   Toni Chessman, DO  valACYclovir (VALTREX) 500 MG tablet Take 1 tablet (500 mg total) by mouth daily. 1 tablet po QD. 08/29/14   Toni Oletta Lamas, MD   BP 131/82 mmHg  Pulse 90  Temp(Src) 99.5 F (37.5 C) (Oral)  Resp 18  Ht 5\' 1"  (1.549 m)  Wt 160 lb (72.576 kg)  BMI 30.25 kg/m2  SpO2 98% Physical Exam  Constitutional: She is oriented to person, place, and time. She appears well-developed and well-nourished.  HENT:  Head: Normocephalic and atraumatic.  Cardiovascular: Normal rate and intact distal pulses.   Pulmonary/Chest: Effort normal.   Abdominal: She exhibits no distension.  Musculoskeletal: Normal range of motion. She exhibits tenderness. She exhibits no edema.  Tenderness to palpation along plantar fascia of right foot  Neurological: She is alert and oriented to person, place, and time.  Sensation intact  Skin: Skin is warm and dry.  Psychiatric: She has a normal mood and affect.  Nursing note and vitals reviewed.   ED Course  Procedures (including critical care time)  DIAGNOSTIC STUDIES: Oxygen Saturation is 98% on room air, normal by my interpretation.    COORDINATION OF CARE: 9:44 PM Discussed treatment plan with pt at bedside and pt agreed to plan.    MDM   Final diagnoses:  Plantar fasciitis of right foot    Patient with symptoms consistent with plantar fasciitis, recommend primary care follow-up. Patient given instructions for stretching as well as to get orthotics.  I personally performed the services described in this documentation, which was scribed in my presence. The recorded information has been reviewed and is accurate.      Toni Circle, PA-C 01/05/15 2150  Toni Arthurs, MD 01/05/15 [phone]

## 2015-01-05 NOTE — Discharge Instructions (Signed)
Plantar Fasciitis Plantar fasciitis is a painful foot condition that affects the heel. It occurs when the band of tissue that connects the toes to the heel bone (plantar fascia) becomes irritated. This can happen after exercising too much or doing other repetitive activities (overuse injury). The pain from plantar fasciitis can range from mild irritation to severe pain that makes it difficult for you to walk or move. The pain is usually worse in the morning or after you have been sitting or lying down for a while. CAUSES This condition may be caused by:  Standing for long periods of time.  Wearing shoes that do not fit.  Doing high-impact activities, including running, aerobics, and ballet.  Being overweight.  Having an abnormal way of walking (gait).  Having tight calf muscles.  Having high arches in your feet.  Starting a new athletic activity. SYMPTOMS The main symptom of this condition is heel pain. Other symptoms include:  Pain that gets worse after activity or exercise.  Pain that is worse in the morning or after resting.  Pain that goes away after you walk for a few minutes. DIAGNOSIS This condition may be diagnosed based on your signs and symptoms. Your health care provider will also do a physical exam to check for:  A tender area on the bottom of your foot.  A high arch in your foot.  Pain when you move your foot.  Difficulty moving your foot. You may also need to have imaging studies to confirm the diagnosis. These can include:  X-rays.  Ultrasound.  MRI. TREATMENT  Treatment for plantar fasciitis depends on the severity of the condition. Your treatment may include:  Rest, ice, and over-the-counter pain medicines to manage your pain.  Exercises to stretch your calves and your plantar fascia.  A splint that holds your foot in a stretched, upward position while you sleep (night splint).  Physical therapy to relieve symptoms and prevent problems in the  future.  Cortisone injections to relieve severe pain.  Extracorporeal shock wave therapy (ESWT) to stimulate damaged plantar fascia with electrical impulses. It is often used as a last resort before surgery.  Surgery, if other treatments have not worked after 12 months. HOME CARE INSTRUCTIONS  Take medicines only as directed by your health care provider.  Avoid activities that cause pain.  Roll the bottom of your foot over a bag of ice or a bottle of cold water. Do this for 20 minutes, 3-4 times a day.  Perform simple stretches as directed by your health care provider.  Try wearing athletic shoes with air-sole or gel-sole cushions or soft shoe inserts.  Wear a night splint while sleeping, if directed by your health care provider.  Keep all follow-up appointments with your health care provider. PREVENTION   Do not perform exercises or activities that cause heel pain.  Consider finding low-impact activities if you continue to have problems.  Lose weight if you need to. The best way to prevent plantar fasciitis is to avoid the activities that aggravate your plantar fascia. SEEK MEDICAL CARE IF:  Your symptoms do not go away after treatment with home care measures.  Your pain gets worse.  Your pain affects your ability to move or do your daily activities.   This information is not intended to replace advice given to you by your health care provider. Make sure you discuss any questions you have with your health care provider.   Document Released: 10/27/2000 Document Revised: 10/23/2014 Document Reviewed: 12/12/2013 Elsevier   Interactive Patient Education 2016 Elsevier Inc.  

## 2015-01-05 NOTE — ED Notes (Signed)
Pain to bottom of R foot since Thursday.  No known injury.  States she did a lot of walking Thursday.

## 2015-01-25 ENCOUNTER — Encounter: Payer: Self-pay | Admitting: Obstetrics and Gynecology

## 2015-01-27 ENCOUNTER — Telehealth: Payer: Self-pay | Admitting: Emergency Medicine

## 2015-01-27 NOTE — Telephone Encounter (Signed)
1506: Patient returned call and she is advised of message from Dr. Quincy Simmonds.  Advised office visit can be scheduled. Patient agreeable. Office visit with Dr. Quincy Simmonds scheduled for 01/29/15 at 1300. She will call back with any concerns or worsening symptoms prior to appointment.   Routing to provider for final review. Patient agreeable to disposition. Will close encounter.

## 2015-01-27 NOTE — Telephone Encounter (Signed)
  Chief Complaint  Patient presents with  . Advice Only    Patient sent mychart message     ===View-only below this line===   ----- Message -----    From: Toni Warren    Sent: 01/25/2015 11:06 AM EST      To: Arloa Koh, MD Subject: Non-Urgent Medical Question  Good morning Dr. Quincy Simmonds, I hope that all is well with you. I have a concern and a question regarding my Mirena IUD. I have been having constant cramping, bloating, bleeding and pain.   The bleeding comes and goes and is very irregular. My question is, is it possible to combine birth control pills with my IUD?  I currently do not have health insurance so I do not know if getting the IUD is an option at this point. I would like to continue to have the IUD actually, just need some regularity and comfort. Thank you.

## 2015-01-27 NOTE — Telephone Encounter (Signed)
0825 Incoming call from patient.  Reviewed message from her to Dr. Quincy Simmonds. Mirena placed 10/31/13 by Dr. Quincy Simmonds.  Patient states that for two months she has had increased pelvic cramps and abdominal bloating. She states she has had intermittent vaginal bleeding and spotting which has required her to use a daily panty liner which is irritating to her vulva. Patient states she can feel her IUD strings. Has a new partner and still reports pain and vaginal bleeding after intercourse but states that she feels that bleeding is because sexual activity is infrequent. Denies concerns about STD's. Patient is asking if she can starts on a birth control pill to help stop the spotting that she is experiencing. Declines office visit due to insurance concerns. Advised will send message to Dr. Quincy Simmonds and return call.  Patient agreeable.

## 2015-01-27 NOTE — Telephone Encounter (Signed)
Needs office visit for evaluation with me please.  I want to be sure I do correct care for her.   Thanks.

## 2015-01-27 NOTE — Telephone Encounter (Signed)
Message left to return call to Toni Warren at [phone].    

## 2015-01-27 NOTE — Telephone Encounter (Signed)
Telephone call for triage created to discuss message with patient and disposition as appropriate.   

## 2015-01-29 ENCOUNTER — Encounter: Payer: Self-pay | Admitting: Obstetrics and Gynecology

## 2015-01-29 ENCOUNTER — Ambulatory Visit (INDEPENDENT_AMBULATORY_CARE_PROVIDER_SITE_OTHER): Payer: Medicaid Other | Admitting: Obstetrics and Gynecology

## 2015-01-29 VITALS — BP 120/76 | HR 80 | Resp 18 | Ht 63.0 in | Wt 155.8 lb

## 2015-01-29 DIAGNOSIS — Z3009 Encounter for other general counseling and advice on contraception: Secondary | ICD-10-CM

## 2015-01-29 DIAGNOSIS — Z113 Encounter for screening for infections with a predominantly sexual mode of transmission: Secondary | ICD-10-CM

## 2015-01-29 DIAGNOSIS — N926 Irregular menstruation, unspecified: Secondary | ICD-10-CM

## 2015-01-29 LAB — POCT URINE PREGNANCY: Preg Test, Ur: NEGATIVE

## 2015-01-29 MED ORDER — DESOGESTREL-ETHINYL ESTRADIOL 0.15-0.02/0.01 MG (21/5) PO TABS: 1.0000 | ORAL_TABLET | Freq: Every day | ORAL | Status: DC

## 2015-01-29 NOTE — Progress Notes (Signed)
Patient ID: Toni Warren, female   DOB: Jun 26, 1985, 29 y.o.   MRN: [id number] GYNECOLOGY  VISIT   HPI: 29 y.o.   Single  African American  female   [phone] with Patient's last menstrual period was 11/16/2014 (approximate).   here for irregular vaginal bleeding with pelvic discomfort/bloating for two months.  Pelvic discomfort is usually on Left side. States she is spotting and cramping.   Menses in October 2016 was a normal cycle.  Bleeding half of a month.  Panty liners are causing irritation.  Asking about using a birth control pill to control symptoms.  Had irregular bleeding with [place]. Hx of menstrual headaches. When she strains to have a BL, bleeding increases.  No dysuria.   Itching on vulva from panty liner use.   Ultrasound in July 2015 showing possible hematometra, otherwise negative.   New partner.  Spotting started prior to this new partner.  No condom use.  UPT - negative.   GYNECOLOGIC HISTORY: Patient's last menstrual period was 11/16/2014 (approximate). Contraception:Mirena IUD--inserted 10-31-13 Menopausal hormone therapy: n/a Last mammogram: n/a Last pap smear: 08-03-13 Neg:Neg HR HPV        OB History    Gravida Para Term Preterm AB TAB SAB Ectopic Multiple Living   3 2 2  0 1 1 0 0 0 2         Patient Active Problem List   Diagnosis Date Noted  . GERD (gastroesophageal reflux disease) 03/09/2014  . Altered bowel habits 03/09/2014  . Bloating 03/09/2014  . Helicobacter pylori ab+ 03/09/2014  . 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 Menorrhagia 03/29/2013  . General counseling for prescription of oral contraceptives 03/29/2013  . Abdominal hernia 09/29/2011    Past Medical History  Diagnosis Date  . Anemia   . Hyperlipidemia     postpartum 2+yrs ago  . Hypertension     postpartum 2+yrs ago  . History of bronchitis 3+yrs ago  . History of migraine 09/20/11-last one  . GERD (gastroesophageal reflux disease)     only takes OTC meds  .  Sickle cell trait ([place])   . Anxiety   . Depression     but doesn't require meds  . 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 HSV-1 (herpes simplex virus 1) infection   . HSV-2 (herpes simplex virus 2) infection   . Condyloma acuminatum of vulva   . Abdominal hernia     Past Surgical History  Procedure Laterality Date  . Ovarian cyst removal  2012  . Eye surgery  1990's  . Ventral hernia repair  09/29/2011    Procedure: HERNIA REPAIR VENTRAL ADULT;  Surgeon: Joyice Faster. Cornett, MD;  Location: [facility];  Service: General;  Laterality: N/A;  . Abdominoplasty  09/29/2011    Procedure: ABDOMINOPLASTY;  Surgeon: Theodoro Kos, DO;  Location: [facility];  Service: Plastics;  Laterality: N/A;  ABDOMINOPLASTY FOR REPAIR OF RECTUS DIASTASIS    Current Outpatient Prescriptions  Medication Sig Dispense Refill  . acetaminophen (TYLENOL) 500 MG tablet Take 1,000 mg by mouth every 6 (six) hours as needed. For pain    . aspirin-acetaminophen-caffeine (EXCEDRIN MIGRAINE) 250-250-65 MG per tablet Take 1 tablet by mouth daily as needed. For migraines    . B Complex Vitamins (B COMPLEX 100 PO) Take by mouth daily.    Marland Kitchen levonorgestrel (MIRENA) 20 MCG/24HR IUD 1 each by Intrauterine route once.    Marland Kitchen omeprazole (PRILOSEC) 20 MG capsule Take 1 capsule (20  mg total) by mouth daily. (Patient taking differently: Take 20 mg by mouth as needed. ) 30 capsule 3  . valACYclovir (VALTREX) 500 MG tablet Take 1 tablet (500 mg total) by mouth daily. 1 tablet po QD. 30 tablet 12   No current facility-administered medications for this visit.     ALLERGIES: Other and Percocet  Family History  Problem Relation Age of Onset  . Hypertension Father     Social History   Social History  . Marital Status: Single    Spouse Name: N/A  . Number of Children: N/A  . Years of Education: N/A   Occupational History  . Not on file.   Social History Main Topics  . Smoking status: Former Research scientist (life sciences)  . Smokeless tobacco: Never  Used  . Alcohol Use: 0.0 oz/week    0 Standard drinks or equivalent per week  . Drug Use: No  . Sexual Activity:    Partners: Male    Birth Control/ Protection: IUD     Comment: Mirena IUD inserted 10-31-13   Other Topics Concern  . Not on file   Social History Narrative    ROS:  Pertinent items are noted in HPI.  PHYSICAL EXAMINATION:    BP 120/76 mmHg  Pulse 80  Resp 18  Ht 5\' 3"  (1.6 m)  Wt 155 lb 12.8 oz (70.67 kg)  BMI 27.61 kg/m2  LMP 11/16/2014 (Approximate)    General appearance: alert, cooperative and appears stated age   Abdomen: soft, non-tender except mild tenderness in left lower quadrant with no guarding or rebound, no masses,  no organomegaly   Pelvic: External genitalia: left labia majora with dry skin and slight hypopigmentation.               Urethra:  normal appearing urethra with no masses, tenderness or lesions              Bartholins and Skenes: normal                 Vagina: normal appearing vagina with normal color and discharge, no lesions              Cervix: no lesions and IUD strings seen.  No CMT.             Bimanual Exam:  Uterus:  normal size, contour, position, consistency, mobility, non-tender              Adnexa: normal adnexa and no mass, fullness, tenderness            Chaperone was present for exam.  ASSESSMENT  Irregular vaginal bleeding.  Mirena IUD patient.  New sexual partner.  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  Hx of menstrual headaches.   PLAN  Counseled regarding irregular vaginal bleeding.  Will do full STD testing today.  Will prescribe Mircette for 6 months.  Instructed in use. Hydrocortisone to vulva for itching and irritation.  Try hypoallergenic pads. Follow up for annual exam next July and prn.   An After Visit Summary was printed and given to the patient.  _15_____ minutes face to face time of which over 50% was spent in counseling.

## 2015-01-30 ENCOUNTER — Telehealth: Payer: Self-pay | Admitting: Obstetrics and Gynecology

## 2015-01-30 LAB — STD PANEL
HIV 1&2 Ab, 4th Generation: NONREACTIVE
Hepatitis B Surface Ag: NEGATIVE

## 2015-01-30 LAB — GC/CHLAMYDIA PROBE AMP, URINE
Chlamydia, Swab/Urine, PCR: NOT DETECTED
GC Probe Amp, Urine: NOT DETECTED

## 2015-01-30 LAB — HEPATITIS C ANTIBODY: HCV Ab: NEGATIVE

## 2015-01-30 NOTE — Telephone Encounter (Signed)
Patient wants to talk with the nurse. No information given °

## 2015-01-30 NOTE — Telephone Encounter (Signed)
Results to patient through My Chart. No further instructions.

## 2015-01-30 NOTE — Telephone Encounter (Signed)
Call to patient. She is asking what blood work was drawn for her yesterday at office visit. Advised of blood work and negative results.  Advised will wait for final instructions from Dr. Quincy Simmonds and will return call with any additional instructions.

## 2015-01-31 NOTE — Telephone Encounter (Signed)
Message from Dr. Quincy Simmonds noted.  Will close encounter.

## 2015-04-14 ENCOUNTER — Ambulatory Visit (INDEPENDENT_AMBULATORY_CARE_PROVIDER_SITE_OTHER): Payer: BLUE CROSS/BLUE SHIELD | Admitting: Obstetrics and Gynecology

## 2015-04-14 ENCOUNTER — Encounter: Payer: Self-pay | Admitting: Obstetrics and Gynecology

## 2015-04-14 VITALS — BP 120/78 | HR 66 | Temp 98.2°F | Resp 16 | Ht 63.0 in | Wt 154.8 lb

## 2015-04-14 DIAGNOSIS — R634 Abnormal weight loss: Secondary | ICD-10-CM

## 2015-04-14 DIAGNOSIS — R631 Polydipsia: Secondary | ICD-10-CM

## 2015-04-14 DIAGNOSIS — Z113 Encounter for screening for infections with a predominantly sexual mode of transmission: Secondary | ICD-10-CM | POA: Diagnosis not present

## 2015-04-14 DIAGNOSIS — N760 Acute vaginitis: Secondary | ICD-10-CM

## 2015-04-14 NOTE — Progress Notes (Signed)
GYNECOLOGY  VISIT   HPI: 30 y.o.   Single  African American  female   [phone] with No LMP recorded. Patient is not currently having periods (Reason: IUD).   here for vaginal irritation/burning with occasional nausea.    Has a new partner.  Notes itching and always had discharge.  Has irritation with condom use.  Not sure if she has allergy to latex.   No dysuria.   No new exposures to detergents.   Hx HSV I and II.   Notes some weight loss.  Keeping very busy.   Forgets to eat.  Has increased thirst for sodas.   GYNECOLOGIC HISTORY: No LMP recorded. Patient is not currently having periods (Reason: IUD). Contraception: Mirena IUD inserted 10-31-13/condoms occ. Menopausal hormone therapy: n/a Last mammogram: n/a Last pap smear: 08/03/13/ Neg:Neg        OB History    Gravida Para Term Preterm AB TAB SAB Ectopic Multiple Living   3 2 2  0 1 1 0 0 0 2         Patient Active Problem List   Diagnosis Date Noted  . GERD (gastroesophageal reflux disease) 03/09/2014  . Altered bowel habits 03/09/2014  . Bloating 03/09/2014  . Helicobacter pylori ab+ 03/09/2014  . 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 Menorrhagia 03/29/2013  . General counseling for prescription of oral contraceptives 03/29/2013  . Abdominal hernia 09/29/2011    Past Medical History  Diagnosis Date  . Anemia   . Hyperlipidemia     postpartum 2+yrs ago  . Hypertension     postpartum 2+yrs ago  . History of bronchitis 3+yrs ago  . History of migraine 09/20/11-last one  . GERD (gastroesophageal reflux disease)     only takes OTC meds  . Sickle cell trait ([place])   . Anxiety   . Depression     but doesn't require meds  . 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 HSV-1 (herpes simplex virus 1) infection   . HSV-2 (herpes simplex virus 2) infection   . Condyloma acuminatum of vulva   . Abdominal hernia     Past Surgical History  Procedure Laterality Date  . Ovarian cyst removal  2012  . Eye  surgery  1990's  . Ventral hernia repair  09/29/2011    Procedure: HERNIA REPAIR VENTRAL ADULT;  Surgeon: Joyice Faster. Cornett, MD;  Location: [facility];  Service: General;  Laterality: N/A;  . Abdominoplasty  09/29/2011    Procedure: ABDOMINOPLASTY;  Surgeon: Theodoro Kos, DO;  Location: [facility];  Service: Plastics;  Laterality: N/A;  ABDOMINOPLASTY FOR REPAIR OF RECTUS DIASTASIS    Current Outpatient Prescriptions  Medication Sig Dispense Refill  . acetaminophen (TYLENOL) 500 MG tablet Take 1,000 mg by mouth every 6 (six) hours as needed. For pain    . aspirin-acetaminophen-caffeine (EXCEDRIN MIGRAINE) 250-250-65 MG per tablet Take 1 tablet by mouth daily as needed. For migraines    . B Complex Vitamins (B COMPLEX 100 PO) Take by mouth daily.    Marland Kitchen desogestrel-ethinyl estradiol (KARIVA,AZURETTE,MIRCETTE) 0.15-0.02/0.01 MG (21/5) tablet Take 1 tablet by mouth daily. 3 Package 1  . levonorgestrel (MIRENA) 20 MCG/24HR IUD 1 each by Intrauterine route once.    Marland Kitchen omeprazole (PRILOSEC) 20 MG capsule Take 1 capsule (20 mg total) by mouth daily. (Patient taking differently: Take 20 mg by mouth as needed. ) 30 capsule 3  . valACYclovir (VALTREX) 500 MG tablet Take 1 tablet (500 mg total) by  mouth daily. 1 tablet po QD. 30 tablet 12   No current facility-administered medications for this visit.     ALLERGIES: Other and Percocet  Family History  Problem Relation Age of Onset  . Hypertension Father     Social History   Social History  . Marital Status: Single    Spouse Name: N/A  . Number of Children: N/A  . Years of Education: N/A   Occupational History  . Not on file.   Social History Main Topics  . Smoking status: Former Research scientist (life sciences)  . Smokeless tobacco: Never Used  . Alcohol Use: 0.0 oz/week    0 Standard drinks or equivalent per week  . Drug Use: No  . Sexual Activity:    Partners: Male    Birth Control/ Protection: IUD     Comment: Mirena IUD inserted 10-31-13   Other Topics Concern  .  Not on file   Social History Narrative    ROS:  Pertinent items are noted in HPI.  PHYSICAL EXAMINATION:    BP 120/78 mmHg  Pulse 66  Temp(Src) 98.2 F (36.8 C) (Oral)  Resp 16  Ht 5\' 3"  (1.6 m)  Wt 154 lb 12.8 oz (70.217 kg)  BMI 27.43 kg/m2    General appearance: alert, cooperative and appears stated age    Pelvic: External genitalia:  Mild erythema around introitus.               Urethra:  normal appearing urethra with no masses, tenderness or lesions              Bartholins and Skenes: normal                 Vagina: normal appearing vagina with normal color and discharge, no lesions              Cervix: no lesions and IUD strings seen.     Bimanual Exam:  Uterus:  normal size, contour, position, consistency, mobility, non-tender              Adnexa: normal adnexa and no mass, fullness, tenderness        Chaperone was present for exam.  ASSESSMENT  Desires STD testing.  Weight loss and soda cravings.  Hx HSV I and II.  PLAN  Counseled regarding condoms - latex and nonlatex and spermacides.  STD testing today including Affirm.  Check TFTs and BMP.  May need to increase her calories.    An After Visit Summary was printed and given to the patient.  __15____ minutes face to face time of which over 50% was spent in counseling.

## 2015-04-14 NOTE — Addendum Note (Signed)
Addended by: Lowella Fairy on: 04/14/2015 02:47 PM   Modules accepted: Orders

## 2015-04-15 LAB — THYROID PANEL WITH TSH
Free Thyroxine Index: 2.2 (ref 1.4–3.8)
T3 Uptake: 31 % (ref 22–35)
T4, Total: 7.2 ug/dL (ref 4.5–12.0)
TSH: 1.43 mIU/L

## 2015-04-15 LAB — WET PREP BY MOLECULAR PROBE
Candida species: POSITIVE — AB
Gardnerella vaginalis: POSITIVE — AB
Trichomonas vaginosis: NEGATIVE

## 2015-04-15 LAB — BASIC METABOLIC PANEL
BUN: 10 mg/dL (ref 7–25)
CO2: 29 mmol/L (ref 20–31)
Calcium: 9.1 mg/dL (ref 8.6–10.2)
Chloride: 102 mmol/L (ref 98–110)
Creat: 0.86 mg/dL (ref 0.50–1.10)
Glucose, Bld: 66 mg/dL (ref 65–99)
Potassium: 4.2 mmol/L (ref 3.5–5.3)
Sodium: 138 mmol/L (ref 135–146)

## 2015-04-15 LAB — GC/CHLAMYDIA PROBE AMP
CT Probe RNA: NOT DETECTED
GC Probe RNA: NOT DETECTED

## 2015-04-15 LAB — STD PANEL
HIV 1&2 Ab, 4th Generation: NONREACTIVE
Hepatitis B Surface Ag: NEGATIVE

## 2015-04-15 LAB — HEPATITIS C ANTIBODY: HCV Ab: NEGATIVE

## 2015-04-16 ENCOUNTER — Telehealth: Payer: Self-pay

## 2015-04-16 MED ORDER — METRONIDAZOLE 500 MG PO TABS: 500.0000 mg | ORAL_TABLET | Freq: Two times a day (BID) | ORAL | Status: DC

## 2015-04-16 MED ORDER — FLUCONAZOLE 150 MG PO TABS: 150.0000 mg | ORAL_TABLET | Freq: Once | ORAL | Status: DC

## 2015-04-16 NOTE — Telephone Encounter (Signed)
Spoke with patient. Advised of results as seen below from [place]. She is agreeable and verbalizes understanding. Rx for Diflucan 150 mg po can repeat in 48 hours if needed #2 0RF and Flagyl 500 mg po bid for 7 days #14 0RF sent to pharmacy on file. ETOH precautions given.  Routing to provider for final review. Patient agreeable to disposition. Will close encounter.

## 2015-04-16 NOTE — Telephone Encounter (Signed)
-----   Message from Nunzio Cobbs, MD sent at 04/16/2015 12:59 PM EST ----- Please contact patient with results.  She has both yeast vaginitis and bacterial vaginosis.  I am recommending the following if it is OK with her: Diflucan 150 mg po.  Can repeat in 48 hours if needed.   #2, RF none.  Flagyl 500 mg po bid for 7 days.   All STD testing is negative. Thyroid testing and metabolic profile are normal.   Cc - Marisa Sprinkles

## 2015-07-18 ENCOUNTER — Ambulatory Visit: Payer: BLUE CROSS/BLUE SHIELD | Admitting: Certified Nurse Midwife

## 2015-07-18 ENCOUNTER — Telehealth: Payer: Self-pay | Admitting: Certified Nurse Midwife

## 2015-07-18 NOTE — Telephone Encounter (Signed)
Patient canceled her bacterial infection appointment today at 1:45pm. Patient said "I no longer need this appointment." FYI: patient scheduled and canceled this appointment within the 24 hours.

## 2015-08-15 ENCOUNTER — Encounter: Payer: Self-pay | Admitting: Obstetrics and Gynecology

## 2015-08-15 ENCOUNTER — Ambulatory Visit (INDEPENDENT_AMBULATORY_CARE_PROVIDER_SITE_OTHER): Payer: BLUE CROSS/BLUE SHIELD | Admitting: Obstetrics and Gynecology

## 2015-08-15 VITALS — BP 130/82 | HR 80 | Temp 98.1°F | Resp 20 | Ht 63.0 in | Wt 156.0 lb

## 2015-08-15 DIAGNOSIS — Z124 Encounter for screening for malignant neoplasm of cervix: Secondary | ICD-10-CM

## 2015-08-15 DIAGNOSIS — N930 Postcoital and contact bleeding: Secondary | ICD-10-CM

## 2015-08-15 DIAGNOSIS — Z113 Encounter for screening for infections with a predominantly sexual mode of transmission: Secondary | ICD-10-CM | POA: Diagnosis not present

## 2015-08-15 LAB — POCT URINALYSIS DIPSTICK
Bilirubin, UA: NEGATIVE
Blood, UA: NEGATIVE
Glucose, UA: NEGATIVE
Ketones, UA: NEGATIVE
Leukocytes, UA: NEGATIVE
Nitrite, UA: NEGATIVE
Protein, UA: NEGATIVE
Urobilinogen, UA: NEGATIVE
pH, UA: 5

## 2015-08-15 NOTE — Progress Notes (Signed)
GYNECOLOGY  VISIT   HPI: 30 y.o.   Single  African American  female   [phone] with No LMP recorded. Patient is not currently having periods (Reason: IUD).   here for   Vaginitis and STD Check.  Received Flagyl from her PCP.  Some vaginal odor with condom use. Odor is improved with taking a couple of Flagyl doses. This is her second round of the Flagyl. Notes the clitoral region is sore.  Received an Rx for Diflucan and has taken one so far for possible yeast infection.   Having bleeding with intercourse every time for the last month.  Also having pain with intercourse.   Not taking oral contraceptives at this time to control breakthrough bleeding with her Mirena.  Positive UPT 08/13/15.  Negative UPT last hs.   States urine is cloudy.  Denies dysuria, frequency, and urgency.   Urine dip - trace WBCs.   GYNECOLOGIC HISTORY: No LMP recorded. Patient is not currently having periods (Reason: IUD). Contraception:   Mirena IUD.  Menopausal hormone therapy:  None Last mammogram:  07/05/14 [place] Left BIRADS1:Neg Last pap smear:   08/03/13 Neg. HR HPV:neg.  Remote hx of abnormal pap.        OB History    Gravida Para Term Preterm AB TAB SAB Ectopic Multiple Living   3 2 2  0 1 1 0 0 0 2         Patient Active Problem List   Diagnosis Date Noted  . GERD (gastroesophageal reflux disease) 03/09/2014  . Altered bowel habits 03/09/2014  . Bloating 03/09/2014  . Helicobacter pylori ab+ 03/09/2014  . 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 Menorrhagia 03/29/2013  . General counseling for prescription of oral contraceptives 03/29/2013  . Abdominal hernia 09/29/2011    Past Medical History  Diagnosis Date  . Anemia   . Hyperlipidemia     postpartum 2+yrs ago  . Hypertension     postpartum 2+yrs ago  . History of bronchitis 3+yrs ago  . History of migraine 09/20/11-last one  . GERD (gastroesophageal reflux disease)     only takes OTC meds  . Sickle cell trait ([place])   . Anxiety    . Depression     but doesn't require meds  . 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 HSV-1 (herpes simplex virus 1) infection   . HSV-2 (herpes simplex virus 2) infection   . Condyloma acuminatum of vulva   . Abdominal hernia     Past Surgical History  Procedure Laterality Date  . Ovarian cyst removal  2012  . Eye surgery  1990's  . Ventral hernia repair  09/29/2011    Procedure: HERNIA REPAIR VENTRAL ADULT;  Surgeon: Joyice Faster. Cornett, MD;  Location: [facility];  Service: General;  Laterality: N/A;  . Abdominoplasty  09/29/2011    Procedure: ABDOMINOPLASTY;  Surgeon: Theodoro Kos, DO;  Location: [facility];  Service: Plastics;  Laterality: N/A;  ABDOMINOPLASTY FOR REPAIR OF RECTUS DIASTASIS    Current Outpatient Prescriptions  Medication Sig Dispense Refill  . acetaminophen (TYLENOL) 500 MG tablet Take 1,000 mg by mouth every 6 (six) hours as needed. For pain    . aspirin-acetaminophen-caffeine (EXCEDRIN MIGRAINE) 250-250-65 MG per tablet Take 1 tablet by mouth daily as needed. For migraines    . B Complex Vitamins (B COMPLEX 100 PO) Take by mouth daily.    Marland Kitchen desogestrel-ethinyl estradiol (KARIVA,AZURETTE,MIRCETTE) 0.15-0.02/0.01 MG (21/5) tablet Take 1 tablet by mouth  daily. 3 Package 1  . levonorgestrel (MIRENA) 20 MCG/24HR IUD 1 each by Intrauterine route once.    . metroNIDAZOLE (FLAGYL) 500 MG tablet Take 1 tablet (500 mg total) by mouth 2 (two) times daily. 14 tablet 0  . omeprazole (PRILOSEC) 20 MG capsule Take 1 capsule (20 mg total) by mouth daily. (Patient taking differently: Take 20 mg by mouth as needed. ) 30 capsule 3  . valACYclovir (VALTREX) 500 MG tablet Take 1 tablet (500 mg total) by mouth daily. 1 tablet po QD. 30 tablet 12   No current facility-administered medications for this visit.     ALLERGIES: Other and Percocet  Family History  Problem Relation Age of Onset  . Hypertension Father     Social History   Social History  . Marital Status: Single     Spouse Name: N/A  . Number of Children: N/A  . Years of Education: N/A   Occupational History  . Not on file.   Social History Main Topics  . Smoking status: Former Research scientist (life sciences)  . Smokeless tobacco: Never Used  . Alcohol Use: 0.0 oz/week    0 Standard drinks or equivalent per week  . Drug Use: No  . Sexual Activity:    Partners: Male    Birth Control/ Protection: IUD     Comment: Mirena IUD inserted 10-31-13   Other Topics Concern  . Not on file   Social History Narrative    ROS:  Pertinent items are noted in HPI.  PHYSICAL EXAMINATION:    BP 130/82 mmHg  Pulse 80  Temp(Src) 98.1 F (36.7 C) (Oral)  Resp 20  Ht 5\' 3"  (1.6 m)  Wt 156 lb (70.761 kg)  BMI 27.64 kg/m2    General appearance: alert, cooperative and appears stated age    Pelvic: External genitalia:  no lesions              Urethra:  normal appearing urethra with no masses, tenderness or lesions              Bartholins and Skenes: normal                 Vagina: normal appearing vagina with normal color and discharge, no lesions              Cervix: no lesions and and large ectropion.  IUD strings seen.                Pap taken: Yes.   Bimanual Exam:  Uterus:  normal size, contour, position, consistency, mobility, non-tender              Adnexa: normal adnexa and no mass, fullness, tenderness          Chaperone was present for exam.  ASSESSMENT  Mirena IUD patient.  Home positive UPT which then turned negative. Post coital bleeding.  Remote hx abnormal paps.  Recurrent BV.  Desire for full STD testing.  Hx HSV I and II.  PLAN  Pap/HR HPV testing, Affirm, STD testing.  Continue Flagyl.  UPT negative here.  Quant beta hCG now.  Discussed Hylafem for future use if desired. If postcoital bleeding persists, return for re-evaluation.   An After Visit Summary was printed and given to the patient.  ___25___ minutes face to face time of which over 50% was spent in counseling.

## 2015-08-15 NOTE — Patient Instructions (Signed)
Hylafem is an option for treatment for vaginitis.  This is is prescription medication.

## 2015-08-16 LAB — HCG, QUANTITATIVE, PREGNANCY: hCG, Beta Chain, Quant, S: <2 m[IU]/mL

## 2015-08-16 LAB — STD PANEL
HIV 1&2 Ab, 4th Generation: NONREACTIVE
Hepatitis B Surface Ag: NEGATIVE

## 2015-08-16 LAB — WET PREP BY MOLECULAR PROBE
Candida species: NEGATIVE
Gardnerella vaginalis: POSITIVE — AB
Trichomonas vaginosis: NEGATIVE

## 2015-08-16 LAB — HEPATITIS C ANTIBODY: HCV Ab: NEGATIVE

## 2015-08-18 DIAGNOSIS — A749 Chlamydial infection, unspecified: Secondary | ICD-10-CM

## 2015-08-18 HISTORY — DX: Chlamydial infection, unspecified: A74.9

## 2015-08-21 LAB — IPS N GONORRHOEA AND CHLAMYDIA BY PCR

## 2015-08-21 LAB — IPS PAP TEST WITH HPV

## 2015-08-22 ENCOUNTER — Other Ambulatory Visit: Payer: Self-pay | Admitting: Obstetrics and Gynecology

## 2015-08-22 ENCOUNTER — Encounter: Payer: Self-pay | Admitting: Obstetrics and Gynecology

## 2015-08-22 ENCOUNTER — Telehealth: Payer: Self-pay | Admitting: Obstetrics and Gynecology

## 2015-08-22 MED ORDER — AZITHROMYCIN 250 MG PO TABS: 1000.0000 mg | ORAL_TABLET | Freq: Once | ORAL | Status: DC

## 2015-08-22 MED ORDER — FLUCONAZOLE 150 MG PO TABS: 150.0000 mg | ORAL_TABLET | Freq: Once | ORAL | Status: DC

## 2015-08-22 NOTE — Telephone Encounter (Signed)
Medication refill request: Diflucan  Last AEX:  08-29-14  Next AEX: 09-03-15 Last MMG (if hormonal medication request): N/A Refill authorized: please advise  Patient on ABX and is requesting Diflucan

## 2015-08-22 NOTE — Telephone Encounter (Signed)
Patient calling for recent results. °

## 2015-08-22 NOTE — Telephone Encounter (Signed)
Spoke with patient and messages from Dr. Quincy Simmonds discussed.  Pap results given. 02 Recall, patient has annual exam scheduled.   Message from provider given and advised of positive Chlamydia Results. Advised rx was sent to pharmacy, should take treatment as directed, ensure to take with food. Azithromycin 1 gram PO take at once with food sent to pharmacy of choice.  Advised will need to complete treatment and notify partner(s). Stressed need to notify partner and abstain until treatment. To wait seven days after they have completed treatment.   Offered and stressed importance of appointment for 3 month test of cure.  Patient declines, she will call back to schedule.   Advised reportable condition and that report would be sent to health department.Confidential communicable disease report-Part one completed and faxed to [facility], form [employer] 2124, faxed with fax confirmation received and original sent to medical records.   Patient did not have any questions regarding testing/treatment at this time. Advised to call back with any, she is agreeable and verbalized understanding for very important need for treatment and follow up.   Patient is aware to call back with any continued postcoidal bleeding or pelvic pain or fevers.

## 2015-08-22 NOTE — Telephone Encounter (Signed)
Patient is on antibiotics and would like a prescription for diflucan called in to [facility] on [address] at [phone].

## 2015-08-22 NOTE — Telephone Encounter (Signed)
-----   Message from Nunzio Cobbs, MD sent at 08/22/2015 12:56 PM EDT ----- Please inform patient of her positive chlamydia test.  She will need to take Azithromycin 1 gram orally.  Any partner will need evaluation and treatment.  She will need repeat testing in 3 months.  Please schedule this.  Please return sooner if postcoital bleeding persists or has increasing pain.  Her pap was normal and her HR HPV test negative.  Pap recall - 02.   Cc- Marisa Sprinkles

## 2015-08-22 NOTE — Telephone Encounter (Signed)
Patient is asking to talk with Olivia Mackie again.

## 2015-08-22 NOTE — Telephone Encounter (Signed)
Patient is calling, asking if Partner can be treated for chlamydia by our office as well. Advised we cannot treat partner and partner will need to seek care with urgent care, PCP or health department.  She verbalizes understanding.  Routing to provider for final review. Patient agreeable to disposition. Will close encounter.

## 2015-08-27 ENCOUNTER — Telehealth: Payer: Self-pay | Admitting: Obstetrics and Gynecology

## 2015-08-27 NOTE — Telephone Encounter (Signed)
Spoke with patient. Patient states that she was prescribed Azithromycin last week. This was prescribed for positive chlamydia testing. Reports 30 minutes after taking the Azithromycin she threw up. Feels this was related to food she ate before taking the medication. She is unsure if she threw up the pills. Asking if she will need to be retreated at this time. Advised I will speak with Dr.Silva and return call with further recommendations. She is agreeable.

## 2015-08-27 NOTE — Telephone Encounter (Signed)
Patient has a question regarding medication she was prescribed last week.

## 2015-08-27 NOTE — Telephone Encounter (Signed)
Patient will need to be retreated. Options are to treat again with azithromycin 1 gram orally or Doxycycline 100 mg po bid.  The doxycycline can also cause nausea and should be taken after eating food with each dosage.

## 2015-08-27 NOTE — Telephone Encounter (Signed)
Message left to return call to Tracy at [phone].    

## 2015-08-28 NOTE — Telephone Encounter (Signed)
Spoke with patient. Advised of message as seen below from [place]. Patient verbalizes understanding. She would like to take Azithromycin again. Rx for Azithromycin 1 gram take 4 tablets by mouth once #4 0RF sent to pharmacy on file. Patient will contact the office if she has any problems taking Azithromycin.  Routing to provider for final review. Patient agreeable to disposition. Will close encounter.

## 2015-08-31 ENCOUNTER — Telehealth: Payer: Self-pay | Admitting: Obstetrics and Gynecology

## 2015-08-31 NOTE — Telephone Encounter (Signed)
Opened in error

## 2015-09-02 ENCOUNTER — Other Ambulatory Visit: Payer: Self-pay | Admitting: *Deleted

## 2015-09-02 MED ORDER — AZITHROMYCIN 250 MG PO TABS: 1000.0000 mg | ORAL_TABLET | Freq: Once | ORAL | Status: DC

## 2015-09-02 NOTE — Telephone Encounter (Signed)
Patient called states we were supposed to send her a refill on azithromycin last week.  Medication refill request: Azithromycin  Last AEX:  08/29/14  Next AEX: 09/03/15  Last MMG (if hormonal medication request): 07/05/14 [place] Left BIRADS1:neg Refill authorized: 08/22/15 #4/0R. Today please advise.

## 2015-09-03 ENCOUNTER — Ambulatory Visit: Payer: Medicaid Other | Admitting: Obstetrics and Gynecology

## 2015-09-03 ENCOUNTER — Encounter: Payer: Self-pay | Admitting: Obstetrics and Gynecology

## 2015-11-24 ENCOUNTER — Telehealth: Payer: Self-pay | Admitting: Obstetrics and Gynecology

## 2015-11-24 NOTE — Telephone Encounter (Signed)
Please contact patient to schedule her follow up chlamydia retesting.  She was treated in July and needs a follow up appointment with me this month.

## 2015-11-25 NOTE — Telephone Encounter (Signed)
Spoke with patient and scheduled her for 12-04-15 for TOC Chlamydia -eh

## 2015-12-04 ENCOUNTER — Ambulatory Visit (INDEPENDENT_AMBULATORY_CARE_PROVIDER_SITE_OTHER): Payer: BLUE CROSS/BLUE SHIELD | Admitting: Obstetrics and Gynecology

## 2015-12-04 ENCOUNTER — Encounter: Payer: Self-pay | Admitting: Obstetrics and Gynecology

## 2015-12-04 VITALS — BP 110/70 | HR 80 | Ht 63.0 in | Wt 151.0 lb

## 2015-12-04 DIAGNOSIS — Z8619 Personal history of other infectious and parasitic diseases: Secondary | ICD-10-CM | POA: Diagnosis not present

## 2015-12-04 DIAGNOSIS — N912 Amenorrhea, unspecified: Secondary | ICD-10-CM | POA: Diagnosis not present

## 2015-12-04 DIAGNOSIS — N760 Acute vaginitis: Secondary | ICD-10-CM | POA: Diagnosis not present

## 2015-12-04 DIAGNOSIS — R112 Nausea with vomiting, unspecified: Secondary | ICD-10-CM | POA: Diagnosis not present

## 2015-12-04 LAB — POCT URINE PREGNANCY: Preg Test, Ur: NEGATIVE

## 2015-12-04 NOTE — Progress Notes (Signed)
GYNECOLOGY  VISIT   HPI: 30 y.o.   Single  African American  female   [phone] with No LMP recorded. Patient is not currently having periods (Reason: IUD).   here for test of cure for chlamydia.     Positive chlamydia test on 08/15/15.  Tx with Azithromycin 1 gram.  Had to do two treatments of Azithromycin.   Threw up the first dosage. Partner was treated.  Some external irritation following change in her soap.  No itching or odor.  No burning except for external irritation.  Some internal soreness and perirectal irritation.   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  Has left sided discomfort off and on.   Patient states she does not get her cycle.  Has Mirena IUD.  Patient states she had a positive urine pregnancy test at home 11-25-15. He did two tests. She states this happens periodically. Patient complains of occasional nausea. States urinary frequency. No pain with urination.   UPT:  Neg.  GYNECOLOGIC HISTORY: No LMP recorded. Patient is not currently having periods (Reason: IUD). Contraception:  Mirena IUD inserted 10-31-13 Menopausal hormone therapy:  n/a Last mammogram:  n/a Last pap smear:   08-15-15 Neg:Neg HR HPV        OB History    Gravida Para Term Preterm AB Living   3 2 2  0 1 2   SAB TAB Ectopic Multiple Live Births   0 1 0 0 2         Patient Active Problem List   Diagnosis Date Noted  . GERD (gastroesophageal reflux disease) 03/09/2014  . Altered bowel habits 03/09/2014  . Bloating 03/09/2014  . Helicobacter pylori ab+ 03/09/2014  . 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 Menorrhagia 03/29/2013  . General counseling for prescription of oral contraceptives 03/29/2013  . Abdominal hernia 09/29/2011    Past Medical History:  Diagnosis Date  . Abdominal hernia   . Abnormal Pap smear of cervix 2005   HPV +  . Anemia   . Anxiety   . Chlamydia 08/18/15  . Condyloma acuminatum of vulva   . Depression    but doesn't require meds  . GERD (gastroesophageal  reflux disease)    only takes OTC meds  . History of bronchitis 3+yrs ago  . History of migraine 09/20/11-last one  . HSV-1 (herpes simplex virus 1) infection   . HSV-2 (herpes simplex virus 2) infection   . Hyperlipidemia    postpartum 2+yrs ago  . Hypertension    postpartum 2+yrs ago  . Sickle cell trait [facility]     Past Surgical History:  Procedure Laterality Date  . ABDOMINOPLASTY  09/29/2011   Procedure: ABDOMINOPLASTY;  Surgeon: Theodoro Kos, DO;  Location: [facility];  Service: Plastics;  Laterality: N/A;  ABDOMINOPLASTY FOR REPAIR OF RECTUS DIASTASIS  . EYE SURGERY  1990's  . OVARIAN CYST REMOVAL  2012  . VENTRAL HERNIA REPAIR  09/29/2011   Procedure: HERNIA REPAIR VENTRAL ADULT;  Surgeon: Joyice Faster. Cornett, MD;  Location: [facility];  Service: General;  Laterality: N/A;    Current Outpatient Prescriptions  Medication Sig Dispense Refill  . acetaminophen (TYLENOL) 500 MG tablet Take 1,000 mg by mouth every 6 (six) hours as needed. For pain    . aspirin-acetaminophen-caffeine (EXCEDRIN MIGRAINE) 250-250-65 MG per tablet Take 1 tablet by mouth daily as needed. For migraines    . B Complex Vitamins (B COMPLEX 100 PO) Take by mouth daily.    Marland Kitchen levonorgestrel (MIRENA) 20 MCG/24HR  IUD 1 each by Intrauterine route once.    Marland Kitchen omeprazole (PRILOSEC) 20 MG capsule Take 1 capsule (20 mg total) by mouth daily. (Patient taking differently: Take 20 mg by mouth as needed. ) 30 capsule 3   No current facility-administered medications for this visit.      ALLERGIES: Other and Percocet [oxycodone-acetaminophen]  Family History  Problem Relation Age of Onset  . Hypertension Father     Social History   Social History  . Marital status: Single    Spouse name: N/A  . Number of children: N/A  . Years of education: N/A   Occupational History  . Not on file.   Social History Main Topics  . Smoking status: Former Research scientist (life sciences)  . Smokeless tobacco: Never Used  . Alcohol use 0.6 - 1.2 oz/week    1 - 2  Glasses of wine per week     Comment: 1-2 drinks per month  . Drug use: No  . Sexual activity: Yes    Partners: Male    Birth control/ protection: IUD     Comment: Mirena IUD inserted 10-31-13   Other Topics Concern  . Not on file   Social History Narrative  . No narrative on file    ROS:  Pertinent items are noted in HPI.  PHYSICAL EXAMINATION:    BP 110/70 (BP Location: Right Arm, Patient Position: Sitting, Cuff Size: Normal)   Pulse 80   Ht 5\' 3"  (1.6 m)   Wt 151 lb (68.5 kg)   BMI 26.75 kg/m     General appearance: alert, cooperative and appears stated age   Pelvic: External genitalia:  no lesions              Urethra:  normal appearing urethra with no masses, tenderness or lesions              Bartholins and Skenes: normal                 Vagina: normal appearing vagina with normal color and discharge, no lesions              Cervix: no lesions.  IUD strings seen.                 Bimanual Exam:  Uterus:  normal size, contour, position, consistency, mobility, non-tender.  Left uterosacral cord and tenderness.               Adnexa: no mass, fullness, tenderness            Chaperone was present for exam.  ASSESSMENT  Possible left adnexal fibrosis - adhesive disease versus endometriosis change.  Positive chlamydia.  Mirena IUD patient.   PLAN  Affirm.  GC/CT.  Discussed fibrosis palpated.  Qualitative beta hCG.  Discussed the importance of condom use to prevent STDs and potential PID.     An After Visit Summary was printed and given to the patient.  __15____ minutes face to face time of which over 50% was spent in counseling.

## 2015-12-05 ENCOUNTER — Other Ambulatory Visit: Payer: Self-pay | Admitting: *Deleted

## 2015-12-05 LAB — WET PREP BY MOLECULAR PROBE
Candida species: POSITIVE — AB
Gardnerella vaginalis: NEGATIVE
Trichomonas vaginosis: NEGATIVE

## 2015-12-05 LAB — GC/CHLAMYDIA PROBE AMP
CT Probe RNA: NOT DETECTED
GC Probe RNA: NOT DETECTED

## 2015-12-05 LAB — HCG, QUANTITATIVE, PREGNANCY: hCG, Beta Chain, Quant, S: <2 m[IU]/mL

## 2015-12-05 MED ORDER — FLUCONAZOLE 150 MG PO TABS: 150.0000 mg | ORAL_TABLET | Freq: Once | ORAL | 0 refills | Status: AC

## 2015-12-07 ENCOUNTER — Encounter: Payer: Self-pay | Admitting: Obstetrics and Gynecology

## 2015-12-15 ENCOUNTER — Encounter: Payer: Self-pay | Admitting: Obstetrics and Gynecology

## 2015-12-15 ENCOUNTER — Telehealth: Payer: Self-pay | Admitting: Obstetrics and Gynecology

## 2015-12-15 ENCOUNTER — Ambulatory Visit (INDEPENDENT_AMBULATORY_CARE_PROVIDER_SITE_OTHER): Payer: BLUE CROSS/BLUE SHIELD | Admitting: Obstetrics and Gynecology

## 2015-12-15 VITALS — BP 114/80 | HR 84 | Ht 63.0 in | Wt 147.6 lb

## 2015-12-15 DIAGNOSIS — N760 Acute vaginitis: Secondary | ICD-10-CM

## 2015-12-15 NOTE — Telephone Encounter (Signed)
Patient states she saw dr Quincy Simmonds recently and believes she has BV - experiencing odor and discharge. Would like a call to discuss options for treatment.

## 2015-12-15 NOTE — Telephone Encounter (Signed)
Return call to patient. Reports she feels sure she has BV and "wants to know how Dr Quincy Simmonds would handle this."  Advised with negative Affirm on 12-04-15, if she feels symptoms have changed, will ned office visit for evaluation in order to see if treatment is indicated.  Patient reports sticky vaginal discharge with odor that began after intercourse with latex condom. States this is a common cause for her and she has discussed with Dr Quincy Simmonds.  Appointment scheduled for 4pm today.  Routing to provider for final review. Patient agreeable to disposition. Will close encounter.

## 2015-12-15 NOTE — Progress Notes (Signed)
GYNECOLOGY  VISIT   HPI: 30 y.o.   Single  African American  female   [phone] with No LMP recorded. Patient is not currently having periods (Reason: IUD).   here for vaginal discharge with odor.  No burning or itching.    States she had had BV about 5 times this year.   Affirm done 12/04/15 - positive for Candida.  Took 2 doses of Diflucan and symptoms resolved. GC/CT were both negative on the same day.  This was a chlamydia recheck.   Using Summer's Eve vaginal wash. Using externally only.  Not using condoms currently.   GYNECOLOGIC HISTORY: No LMP recorded. Patient is not currently having periods (Reason: IUD). Contraception:  Mirena IUD inserted 10-31-13 Menopausal hormone therapy:  n/a Last mammogram:  n/a Last pap smear:   08-15-15 Neg:Neg HR HPv        OB History    Gravida Para Term Preterm AB Living   3 2 2  0 1 2   SAB TAB Ectopic Multiple Live Births   0 1 0 0 2         Patient Active Problem List   Diagnosis Date Noted  . GERD (gastroesophageal reflux disease) 03/09/2014  . Altered bowel habits 03/09/2014  . Bloating 03/09/2014  . Helicobacter pylori ab+ 03/09/2014  . 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 Menorrhagia 03/29/2013  . General counseling for prescription of oral contraceptives 03/29/2013  . Abdominal hernia 09/29/2011    Past Medical History:  Diagnosis Date  . Abdominal hernia   . Abnormal Pap smear of cervix 2005   HPV +  . Anemia   . Anxiety   . Chlamydia 08/18/2015   repeat testing negative October 2017.  Marland Kitchen Condyloma acuminatum of vulva   . Depression    but doesn't require meds  . GERD (gastroesophageal reflux disease)    only takes OTC meds  . History of bronchitis 3+yrs ago  . History of migraine 09/20/11-last one  . HSV-1 (herpes simplex virus 1) infection   . HSV-2 (herpes simplex virus 2) infection   . Hyperlipidemia    postpartum 2+yrs ago  . Hypertension    postpartum 2+yrs ago  . Sickle cell trait [facility])      Past Surgical History:  Procedure Laterality Date  . ABDOMINOPLASTY  09/29/2011   Procedure: ABDOMINOPLASTY;  Surgeon: Theodoro Kos, DO;  Location: [place];  Service: Plastics;  Laterality: N/A;  ABDOMINOPLASTY FOR REPAIR OF RECTUS DIASTASIS  . EYE SURGERY  1990's  . OVARIAN CYST REMOVAL  2012  . VENTRAL HERNIA REPAIR  09/29/2011   Procedure: HERNIA REPAIR VENTRAL ADULT;  Surgeon: Joyice Faster. Cornett, MD;  Location: [place] [facility];  Service: General;  Laterality: N/A;    Current Outpatient Prescriptions  Medication Sig Dispense Refill  . acetaminophen (TYLENOL) 500 MG tablet Take 1,000 mg by mouth every 6 (six) hours as needed. For pain    . aspirin-acetaminophen-caffeine (EXCEDRIN MIGRAINE) 250-250-65 MG per tablet Take 1 tablet by mouth daily as needed. For migraines    . B Complex Vitamins (B COMPLEX 100 PO) Take by mouth daily.    Marland Kitchen levonorgestrel (MIRENA) 20 MCG/24HR IUD 1 each by Intrauterine route once.    Marland Kitchen omeprazole (PRILOSEC) 20 MG capsule Take 1 capsule (20 mg total) by mouth daily. (Patient taking differently: Take 20 mg by mouth as needed. ) 30 capsule 3   No current facility-administered medications for this visit.      ALLERGIES: Other and Percocet [oxycodone-acetaminophen]  Family History  Problem Relation Age of Onset  . Hypertension Father     Social History   Social History  . Marital status: Single    Spouse name: N/A  . Number of children: N/A  . Years of education: N/A   Occupational History  . Not on file.   Social History Main Topics  . Smoking status: Former Research scientist (life sciences)  . Smokeless tobacco: Never Used  . Alcohol use 0.6 - 1.2 oz/week    1 - 2 Glasses of wine per week     Comment: 1-2 drinks per month  . Drug use: No  . Sexual activity: Yes    Partners: Male    Birth control/ protection: IUD     Comment: Mirena IUD inserted 10-31-13   Other Topics Concern  . Not on file   Social History Narrative  . No narrative on file    ROS:  Pertinent items  are noted in HPI.  PHYSICAL EXAMINATION:    BP 114/80 (BP Location: Left Arm, Patient Position: Sitting, Cuff Size: Normal)   Pulse 84   Ht 5\' 3"  (1.6 m)   Wt 147 lb 9.6 oz (67 kg)   BMI 26.15 kg/m     General appearance: alert, cooperative and appears stated age    Pelvic: External genitalia:  no lesions              Urethra:  normal appearing urethra with no masses, tenderness or lesions              Bartholins and Skenes: normal                 Vagina: normal appearing vagina with normal color and discharge, no lesions              Cervix: no lesions                Bimanual Exam:  Uterus:  normal size, contour, position, consistency, mobility, non-tender              Adnexa: no mass, fullness, tenderness               Chaperone was present for exam.  ASSESSMENT  Chronic vaginitis.  Probable BV.   PLAN  Discussion of bacterial vaginosis and risk factors.  Affirm taken.   If BV confirmed will do Flagyl 500 mg po bid for one week and then a chronic tx Rx for Metrogel pv twice weekly for 3 months.  Try condoms without spermicide.   An After Visit Summary was printed and given to the patient.  ___15___ minutes face to face time of which over 50% was spent in counseling.

## 2015-12-16 ENCOUNTER — Encounter: Payer: Self-pay | Admitting: Obstetrics and Gynecology

## 2015-12-16 LAB — WET PREP BY MOLECULAR PROBE
Candida species: NEGATIVE
Gardnerella vaginalis: NEGATIVE
Trichomonas vaginosis: NEGATIVE

## 2015-12-16 MED ORDER — METRONIDAZOLE 0.75 % VA GEL: 1.0000 | Freq: Every day | VAGINAL | 2 refills | Status: DC

## 2015-12-16 NOTE — Addendum Note (Signed)
Addended by: Yisroel Ramming, Dietrich Pates E on: 12/16/2015 03:24 PM   Modules accepted: Orders

## 2016-01-13 ENCOUNTER — Encounter: Payer: Self-pay | Admitting: Certified Nurse Midwife

## 2016-01-13 ENCOUNTER — Ambulatory Visit (INDEPENDENT_AMBULATORY_CARE_PROVIDER_SITE_OTHER): Payer: BLUE CROSS/BLUE SHIELD | Admitting: Certified Nurse Midwife

## 2016-01-13 VITALS — BP 108/72 | HR 64 | Resp 16 | Ht 63.0 in | Wt 146.0 lb

## 2016-01-13 DIAGNOSIS — Z113 Encounter for screening for infections with a predominantly sexual mode of transmission: Secondary | ICD-10-CM

## 2016-01-13 DIAGNOSIS — N898 Other specified noninflammatory disorders of vagina: Secondary | ICD-10-CM | POA: Diagnosis not present

## 2016-01-13 NOTE — Progress Notes (Signed)
30 y.o. Single African American female [phone] here with complaint of vaginal symptoms of itching, when discharge collects on vulva and increase odor. Describes discharge as milky watery, with sour odor.Onset of symptoms 30 days ago. Denies new personal products or vaginal dryness. Symptoms were better with taking oral probiotic and plans to restart. ?  STD concerns, would like to have screening. Urinary symptoms none . Contraception is Mirena.  Patient smells odor every am. No other concerns today.   O:Healthy female WDWN Affect: normal, orientation x 3  Exam: Abdomen:non tender, soft Lymph node: no enlargement or tenderness Pelvic exam: External genital: normal female, no lesions or scaling or exudate BUS: negative Vagina: milky slight odorous discharge noted. Ph:4.5   ,Wet prep taken, Affirm taken Cervix: normal, non tender, no CMT, IUD string noted in cervical os Uterus: normal, non tender Adnexa:normal, non tender, no masses or fullness noted   Wet Prep results: Saline, KOH negative, except for yeast bud   A:Normal pelvic exam Contraception Mirena IUD ?ph change related to IUD use, no concerns   P:Discussed findings of one yeast bud  and etiology. Feel treatment not indicated unless affirm is positive.  Discussed Aveeno or baking soda sitz bath for comfort and odor control. Discussed Probiotic use again due no odor noted..  Will wait until labs are back to treat if indicated. Patient agreeable. Labs: Affirm, GC,Chlamydia  Rv prn

## 2016-01-14 ENCOUNTER — Other Ambulatory Visit: Payer: Self-pay | Admitting: Certified Nurse Midwife

## 2016-01-14 DIAGNOSIS — N760 Acute vaginitis: Principal | ICD-10-CM

## 2016-01-14 DIAGNOSIS — B9689 Other specified bacterial agents as the cause of diseases classified elsewhere: Secondary | ICD-10-CM

## 2016-01-14 LAB — WET PREP BY MOLECULAR PROBE
Candida species: NEGATIVE
Gardnerella vaginalis: POSITIVE — AB
Trichomonas vaginosis: NEGATIVE

## 2016-01-14 LAB — GC/CHLAMYDIA PROBE AMP
CT Probe RNA: NOT DETECTED
GC Probe RNA: NOT DETECTED

## 2016-01-14 MED ORDER — METRONIDAZOLE 500 MG PO TABS: 500.0000 mg | ORAL_TABLET | Freq: Two times a day (BID) | ORAL | 0 refills | Status: DC

## 2016-01-15 NOTE — Progress Notes (Signed)
Encounter reviewed Jill Jertson, MD   

## 2016-02-11 ENCOUNTER — Telehealth: Payer: Self-pay | Admitting: Certified Nurse Midwife

## 2016-02-11 MED ORDER — FLUCONAZOLE 150 MG PO TABS: ORAL_TABLET | ORAL | 0 refills | Status: DC

## 2016-02-11 NOTE — Telephone Encounter (Signed)
Ok for Diflucan 150 mg po x 1, Dispense 2, RF none.  If symptoms persist, office visit.

## 2016-02-11 NOTE — Telephone Encounter (Signed)
Spoke with patient. Advised of message as seen below from [place]. Patient is agreeable and verbalizes understanding. Rx for Diflucan 150 mg take 1 tablet po now, take 2nd tablet after completing Flagyl #2 0RF sent to pharmacy on file. Patient is asking about rx for boric acid. States she has discussed this at her previous office visits and would like to know if this is something that she should start following treatment with Flagyl. Advised I will review with Dr.Silva and return call. Patient is agreeable.

## 2016-02-11 NOTE — Telephone Encounter (Signed)
Ok to complete the Flagyl as well.  She may want to take the second pill of Diflucan after taking the last dose of the Flagyl.

## 2016-02-11 NOTE — Telephone Encounter (Signed)
Patient wants to speak with nurse no information given. °

## 2016-02-11 NOTE — Telephone Encounter (Signed)
Spoke with patient. Patient states that she completed her full 7 day course of Flagyl 500 mg BID that she was prescribed on 01/14/2016. States that 2 days ago she began to have vaginal odor, milky watery discharge, and itching. Had 7 tablets of Flagyl left over that was prescribed for if symptoms returned. Patient has taken 3 tablets and has used metrogel once with relief. Asking if she needs to continue taking Flagyl or using Metrogel at this time. Also feel she may need rx for Diflucan due to increased itching following taking Flagyl. Advised I will review with Dr.Silva and return call. Patient is agreeable. Patient is not currently sexually active.

## 2016-02-11 NOTE — Telephone Encounter (Signed)
Dr.Silva, should patient take last 4 doses of Flagyl as well?

## 2016-02-12 MED ORDER — NONFORMULARY OR COMPOUNDED ITEM: 0 refills | Status: DC

## 2016-02-12 NOTE — Telephone Encounter (Signed)
I agree with your recommendations.  Encounter closed. 

## 2016-02-12 NOTE — Telephone Encounter (Signed)
Spoke with patient. Advised of message as seen below from [place]. Patient is agreeable and verbalizes understanding. Rx for Boric Acid suppositories 600 mg per vagina for 21 days #21 0RF printed and to Dr.Silva for review and signature for fax to [facility]. Patient is agreeable. Asking if there are any restrictions to intercourse with using boric acid. Advised may have intercourse, but should use condoms. Patient is agreeable.

## 2016-02-12 NOTE — Telephone Encounter (Signed)
OK to start VAGINAL boric acid suppositories 600 mg per vagina for 21 days, RF none.  She can start now.  This will likely need to be sent to [facility] for compounding.

## 2016-02-16 DIAGNOSIS — R7989 Other specified abnormal findings of blood chemistry: Secondary | ICD-10-CM

## 2016-02-16 HISTORY — DX: Other specified abnormal findings of blood chemistry: R79.89

## 2016-02-19 ENCOUNTER — Telehealth: Payer: Self-pay | Admitting: Obstetrics and Gynecology

## 2016-02-19 NOTE — Telephone Encounter (Signed)
Spoke with patient. Patient states pharmacy never received prescription that was to be sent on 12/28. Prescription was for boric acid suppositories. Advised patient RN would f/u and return call, patient is agreeable. See telephone encounter dated 02/11/16.  Spoke with Joseph Art at [facility], no RX received for boric acid suppositories.  Verline Lema, do you have the printed prescription?

## 2016-02-19 NOTE — Telephone Encounter (Signed)
RX was faxed with confirmation to [facility] on 02/13/2016. I do have the original RX. I have refaxed the prescription to [facility] at [phone] with cover sheet and confirmation at this time. Patient has been notified.  Routing to provider for final review. Patient agreeable to disposition. Will close encounter.

## 2016-02-19 NOTE — Telephone Encounter (Signed)
Patient want to know if a script has been sent to her pharmacy for her.

## 2016-05-10 ENCOUNTER — Other Ambulatory Visit: Payer: Self-pay | Admitting: Obstetrics and Gynecology

## 2016-05-10 NOTE — Telephone Encounter (Signed)
Spoke with patient, has been having symptoms. Per patient, has taken all the boric acid that was last prescribed due to symptoms of infection. Offered appointment. Per patient unable to schedule appointment due to work. Patient states that she was seen at PCP for a cough last week and had them do STD testing. Explained to patient that we will be unable to treat her without the proper testing done. Patient insisted we get a copy of labs that were done at PCP. [facility] will be faxing a copy of labs done 05/03/16.

## 2016-05-10 NOTE — Telephone Encounter (Signed)
Patient calling for a refill on boric acid capsules sent to [facility] at [phone].

## 2016-05-10 NOTE — Telephone Encounter (Signed)
Medication refill request: BORIC ACID Last AEX:  08/29/14 BS Next OV: 01/13/16 Vaginal odor      12/15/15 Vaginitis and vulvovaginitis Last MMG (if hormonal medication request): n/a Refill authorized: 02/12/16 #21 w/0 refills; today please advise

## 2016-05-10 NOTE — Telephone Encounter (Signed)
Please contact patient to understand her symptoms.  Is she having an acute infection or is she looking for suppression? She may need to come in for an Affirm for dx of BV and treat the infection and discuss a suppression regimen.   Cc- Terence Lux

## 2016-05-18 NOTE — Telephone Encounter (Signed)
I have not received any records from patient's PCP to date.  Cc- Toni Warren

## 2016-05-19 NOTE — Telephone Encounter (Signed)
Reviewed with Lovena Le, no results received to date as previously requested.   Spoke with Corporate treasurer at [employer]. Was advised results faxed this morning, ATTN; Lovena Le, but will fax again to [phone], ATTN: Sharee Pimple.   Cc: Terence Lux

## 2016-05-20 NOTE — Telephone Encounter (Signed)
Dr. Quincy Simmonds, did you receive these results?   Cc: Terence Lux

## 2016-05-21 NOTE — Telephone Encounter (Signed)
Spoke with patient, advised as seen below per Dr. Quincy Simmonds. Patient is scheduled for AEX 05/28/16 at 2:30pm. Patient verbalizes understanding and is agreeable to date and time.  Routing to provider for final review. Patient is agreeable to disposition. Will close encounter.  Cc: Terence Lux

## 2016-05-21 NOTE — Telephone Encounter (Signed)
Patient needs to come in for annual exam.  I really need to update her care and will reassess her vaginitis.  No Rx at this time.  I am trying to provide good care for her.

## 2016-05-27 NOTE — Progress Notes (Signed)
31 y.o. [id number] Single African American female here for annual exam.    Saw PCP and had a negative vaginitis check within the last month and vaginitis was resolved. Was treated in February for yeast and bacterial vaginosis.  Was tx with Metrogel and Diflucan.   Having recurrent BV.  Has used Boric Acid capsules as well.  Intercourse seems to cause the BV.  Having stress incontinence.   Had blood work as well.  Had negative STD testing through PCP.   Hx low vit D.  Feels down in the winter.  It has been suggested that she may have SAD. Does not feel she needs meds for this.  Spotting occasionally.  Some cramping on the left side is manageable. Takes ibuprofen which controls the pain.   PCP:   Marda Stalker, MD - [facility]  No LMP recorded. Patient is not currently having periods (Reason: IUD).     Period Cycle (Days):  (no cycles with Mirena IUD)     Sexually active: Yes.   female The current method of family planning is IUD--Mirena inserted 10-31-13.    Exercising: No.   Smoker:  no  Health Maintenance: Pap: 08-15-15 Neg:Neg HR HPV;08-03-13 Neg:Neg HR HPV  History of abnormal Pap:  Yes,2005 abnormal pap but no treatment to cervix. MMG: 07-05-14 Lt.Br.US--normal/screening age 61/BiRads1:TBC Colonoscopy:  n/a BMD:   n/a  Result  n/a TDaP:  2012 Gardasil:   no HIV: 04/2016--Neg Hep C: 04/2016--Neg Screening Labs:  Hb today: PCP, Urine today: not done   reports that she has been smoking Cigars.  She has never used smokeless tobacco. She reports that she drinks alcohol. She reports that she does not use drugs.  Past Medical History:  Diagnosis Date  . Abdominal hernia   . Abnormal Pap smear of cervix 2005   HPV +  . Anemia   . Anxiety   . Chlamydia 08/18/2015   repeat testing negative October 2017.  Marland Kitchen Condyloma acuminatum of vulva   . Depression    but doesn't require meds  . GERD (gastroesophageal reflux disease)    only takes OTC meds  . History of  bronchitis 3+yrs ago  . History of migraine 09/20/11-last one  . HSV-1 (herpes simplex virus 1) infection   . HSV-2 (herpes simplex virus 2) infection   . Hyperlipidemia    postpartum 2+yrs ago  . Hypertension    postpartum 2+yrs ago  . Sickle cell trait [facility])     Past Surgical History:  Procedure Laterality Date  . ABDOMINOPLASTY  09/29/2011   Procedure: ABDOMINOPLASTY;  Surgeon: Theodoro Kos, DO;  Location: [place];  Service: Plastics;  Laterality: N/A;  ABDOMINOPLASTY FOR REPAIR OF RECTUS DIASTASIS  . EYE SURGERY  1990's  . OVARIAN CYST REMOVAL  2012  . VENTRAL HERNIA REPAIR  09/29/2011   Procedure: HERNIA REPAIR VENTRAL ADULT;  Surgeon: Joyice Faster. Cornett, MD;  Location: [place] [facility];  Service: General;  Laterality: N/A;    Current Outpatient Prescriptions  Medication Sig Dispense Refill  . acetaminophen (TYLENOL) 500 MG tablet Take 1,000 mg by mouth every 6 (six) hours as needed. For pain    . aspirin-acetaminophen-caffeine (EXCEDRIN MIGRAINE) 250-250-65 MG per tablet Take 1 tablet by mouth daily as needed. For migraines    . B Complex Vitamins (B COMPLEX 100 PO) Take by mouth daily.    Marland Kitchen levonorgestrel (MIRENA) 20 MCG/24HR IUD 1 each by Intrauterine route once.    . NONFORMULARY OR COMPOUNDED ITEM Boric acid suppositories 600  mg. Place 1 suppository vaginally daily for 21 days. 21 each 0  . omeprazole (PRILOSEC) 40 MG capsule Take 1 capsule by mouth as needed.  0   No current facility-administered medications for this visit.     Family History  Problem Relation Age of Onset  . Hypertension Father     ROS:  Pertinent items are noted in HPI.  Otherwise, a comprehensive ROS was negative.  Exam:   BP 120/64 (BP Location: Right Arm, Patient Position: Sitting, Cuff Size: Normal)   Pulse 70   Resp 16   Ht 5\' 2"  (1.575 m)   Wt 151 lb 12.8 oz (68.9 kg)   BMI 27.76 kg/m     General appearance: alert, cooperative and appears stated age Head: Normocephalic, without obvious abnormality,  atraumatic Neck: no adenopathy, supple, symmetrical, trachea midline and thyroid normal to inspection and palpation Lungs: clear to auscultation bilaterally Breasts: normal appearance, no masses or tenderness, No nipple retraction or dimpling, No nipple discharge or bleeding, No axillary or supraclavicular adenopathy Heart: regular rate and rhythm Abdomen: abdominoplasty scar, soft, non-tender; no masses, no organomegaly Extremities: extremities normal, atraumatic, no cyanosis or edema Skin: Skin color, texture, turgor normal. No rashes or lesions Lymph nodes: Cervical, supraclavicular, and axillary nodes normal. No abnormal inguinal nodes palpated Neurologic: Grossly normal  Pelvic: External genitalia:  no lesions              Urethra:  normal appearing urethra with no masses, tenderness or lesions              Bartholins and Skenes: normal                 Vagina: normal appearing vagina with normal color and discharge, no lesions              Cervix: no lesions.  IUD strings noted.              Pap taken: No. Bimanual Exam:  Uterus:  normal size, contour, position, consistency, mobility, non-tender              Adnexa: no mass, fullness, tenderness              Chaperone was present for exam.  Assessment:   Well woman visit with normal exam. HX HSV I and II.   Rare out breaks.   Declines meds for this.  Mirena IUD. Stress incontinence.  Hx right ovarian dermoid cyst removal in 2012.  Chronic bacterial vaginosis.  Plan: Mammogram screening discussed. Recommended self breast awareness. Pap and HR HPV as above. Guidelines for Calcium, Vitamin D, regular exercise program including cardiovascular and weight bearing exercise. Discussed light therapy during the winter. Routine labs.  Discussed stress incontinence and Impressa, PT, and surgical care.  She may try Impressa.  Will do a 6 month course of Metrogel for chronic BV. Follow up annually and prn.    After visit summary  provided.

## 2016-05-28 ENCOUNTER — Other Ambulatory Visit: Payer: Self-pay | Admitting: Obstetrics and Gynecology

## 2016-05-28 ENCOUNTER — Ambulatory Visit (INDEPENDENT_AMBULATORY_CARE_PROVIDER_SITE_OTHER): Payer: BLUE CROSS/BLUE SHIELD | Admitting: Obstetrics and Gynecology

## 2016-05-28 ENCOUNTER — Encounter: Payer: Self-pay | Admitting: Obstetrics and Gynecology

## 2016-05-28 VITALS — BP 120/64 | HR 70 | Resp 16 | Ht 62.0 in | Wt 151.8 lb

## 2016-05-28 DIAGNOSIS — E559 Vitamin D deficiency, unspecified: Secondary | ICD-10-CM

## 2016-05-28 DIAGNOSIS — Z01419 Encounter for gynecological examination (general) (routine) without abnormal findings: Secondary | ICD-10-CM | POA: Diagnosis not present

## 2016-05-28 DIAGNOSIS — N760 Acute vaginitis: Secondary | ICD-10-CM | POA: Diagnosis not present

## 2016-05-28 LAB — CBC
HCT: 41.1 % (ref 35.0–45.0)
Hemoglobin: 13.5 g/dL (ref 11.7–15.5)
MCH: 29.2 pg (ref 27.0–33.0)
MCHC: 32.8 g/dL (ref 32.0–36.0)
MCV: 88.8 fL (ref 80.0–100.0)
MPV: 9.2 fL (ref 7.5–12.5)
Platelets: 314 10*3/uL (ref 140–400)
RBC: 4.63 MIL/uL (ref 3.80–5.10)
RDW: 13.7 % (ref 11.0–15.0)
WBC: 6.5 10*3/uL (ref 3.8–10.8)

## 2016-05-28 LAB — COMPREHENSIVE METABOLIC PANEL
ALT: 11 U/L (ref 6–29)
AST: 13 U/L (ref 10–30)
Albumin: 4.1 g/dL (ref 3.6–5.1)
Alkaline Phosphatase: 60 U/L (ref 33–115)
BUN: 15 mg/dL (ref 7–25)
CO2: 26 mmol/L (ref 20–31)
Calcium: 9.1 mg/dL (ref 8.6–10.2)
Chloride: 104 mmol/L (ref 98–110)
Creat: 0.86 mg/dL (ref 0.50–1.10)
Glucose, Bld: 116 mg/dL — ABNORMAL HIGH (ref 65–99)
Potassium: 3.8 mmol/L (ref 3.5–5.3)
Sodium: 139 mmol/L (ref 135–146)
Total Bilirubin: 0.2 mg/dL (ref 0.2–1.2)
Total Protein: 6.4 g/dL (ref 6.1–8.1)

## 2016-05-28 LAB — LIPID PANEL
Cholesterol: 135 mg/dL (ref <200)
HDL: 48 mg/dL — ABNORMAL LOW (ref >50)
LDL Cholesterol: 71 mg/dL (ref <100)
Total CHOL/HDL Ratio: 2.8 Ratio (ref <5.0)
Triglycerides: 82 mg/dL (ref <150)
VLDL: 16 mg/dL (ref <30)

## 2016-05-28 LAB — TSH: TSH: 0.67 mIU/L

## 2016-05-28 MED ORDER — METRONIDAZOLE 0.75 % VA GEL: VAGINAL | 5 refills | Status: DC

## 2016-05-28 NOTE — Patient Instructions (Signed)

## 2016-05-29 LAB — VITAMIN D 25 HYDROXY (VIT D DEFICIENCY, FRACTURES): Vit D, 25-Hydroxy: 12 ng/mL — ABNORMAL LOW (ref 30–100)

## 2016-05-30 NOTE — Addendum Note (Signed)
Addended by: Yisroel Ramming, Dietrich Pates E on: 05/30/2016 12:29 PM   Modules accepted: Orders

## 2016-05-31 ENCOUNTER — Other Ambulatory Visit: Payer: Self-pay | Admitting: *Deleted

## 2016-05-31 MED ORDER — VITAMIN D (ERGOCALCIFEROL) 1.25 MG (50000 UNIT) PO CAPS: 50000.0000 [IU] | ORAL_CAPSULE | ORAL | 0 refills | Status: DC

## 2016-06-02 LAB — HEMOGLOBIN A1C
Hgb A1c MFr Bld: 5.1 % (ref <5.7)
Mean Plasma Glucose: 100 mg/dL

## 2016-08-24 ENCOUNTER — Telehealth: Payer: Self-pay | Admitting: Obstetrics and Gynecology

## 2016-08-24 NOTE — Telephone Encounter (Signed)
Patient says the metrol gel is not working and would like to have a prescription for the boric acid capsules sent to [facility].

## 2016-08-24 NOTE — Telephone Encounter (Signed)
Spoke with patient, patient requesting to change from metrogel to boric acid vaginal suppositories for chronic BV. Reports metrogel causes spotting, bloating, & irritation. States not as effective as boric acid, request to change back to boric acid suppository. Metrogel last used 7/9 and 7/10.   Not using metrogel twice a week, only as needed, feels like medication is not as effective if using as preventive, precautionary and treatment. Reports no spotting prior to metrogel, has Mirena IUD in place. Notices odor after intercourse. States partner has not changed, no new partners, denies any other GYN/urinary symptoms.   Recommended OV for further evaluation. Patient scheduled for OV on 08/26/16 at 9am with Dr. Quincy Simmonds. Advised patient Dr. Quincy Simmonds is out of the office today, will review with covering provider, will return call with any additional recommendations.   Routing to covering provider for final review. Patient is agreeable to disposition. Will close encounter.   Cc: Dr. Quincy Simmonds

## 2016-08-26 ENCOUNTER — Telehealth: Payer: Self-pay | Admitting: Obstetrics and Gynecology

## 2016-08-26 ENCOUNTER — Ambulatory Visit: Payer: Self-pay | Admitting: Obstetrics and Gynecology

## 2016-08-26 NOTE — Telephone Encounter (Signed)
Spoke with patient. Patient states she cancelled appointment for today because symptoms resolved after stopping Metrogel (see previous telephone encounter dated 08/24/16). Patient declined to reschedule. Patient feels appointment was no longer needed since symptoms have resolved and would like to switch back to boric acid suppositories instead of the metrogel for recurrent BV. Advised patient would review with Dr. Quincy Simmonds and return call with recommendations. Patient verbalizes understanding.   Dr. Quincy Simmonds -please review and advise on boric acid suppositories?

## 2016-08-26 NOTE — Progress Notes (Deleted)
GYNECOLOGY  VISIT   HPI: 31 y.o.   Single  African American  female   [phone] with No LMP recorded. Patient is not currently having periods (Reason: IUD).   here for med change for BV and spotting    GYNECOLOGIC HISTORY: No LMP recorded. Patient is not currently having periods (Reason: IUD). Contraception:  IUD--Mirena inserted 10-31-13. Menopausal hormone therapy:  n/a Last mammogram:  n/a Last pap smear:   08-15-15 Neg:Neg HR HPV;08-03-13 Neg:Neg HR HPV         OB History    Gravida Para Term Preterm AB Living   3 2 2  0 1 2   SAB TAB Ectopic Multiple Live Births   0 1 0 0 2         Patient Active Problem List   Diagnosis Date Noted  . GERD (gastroesophageal reflux disease) 03/09/2014  . Altered bowel habits 03/09/2014  . Bloating 03/09/2014  . Helicobacter pylori ab+ 03/09/2014  . 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 Menorrhagia 03/29/2013  . General counseling for prescription of oral contraceptives 03/29/2013  . Abdominal hernia 09/29/2011    Past Medical History:  Diagnosis Date  . Abdominal hernia   . Abnormal Pap smear of cervix 2005   HPV +  . Anemia   . Anxiety   . Chlamydia 08/18/2015   repeat testing negative October 2017.  Marland Kitchen Condyloma acuminatum of vulva   . Depression    but doesn't require meds  . GERD (gastroesophageal reflux disease)    only takes OTC meds  . History of bronchitis 3+yrs ago  . History of migraine 09/20/11-last one  . HSV-1 (herpes simplex virus 1) infection   . HSV-2 (herpes simplex virus 2) infection   . Hyperlipidemia    postpartum 2+yrs ago  . Hypertension    postpartum 2+yrs ago  . Sickle cell trait [facility])     Past Surgical History:  Procedure Laterality Date  . ABDOMINOPLASTY  09/29/2011   Procedure: ABDOMINOPLASTY;  Surgeon: Theodoro Kos, DO;  Location: [place];  Service: Plastics;  Laterality: N/A;  ABDOMINOPLASTY FOR REPAIR OF RECTUS DIASTASIS  . EYE SURGERY  1990's  . OVARIAN CYST REMOVAL  2012  . VENTRAL HERNIA  REPAIR  09/29/2011   Procedure: HERNIA REPAIR VENTRAL ADULT;  Surgeon: Joyice Faster. Cornett, MD;  Location: [place] [facility];  Service: General;  Laterality: N/A;    Current Outpatient Prescriptions  Medication Sig Dispense Refill  . acetaminophen (TYLENOL) 500 MG tablet Take 1,000 mg by mouth every 6 (six) hours as needed. For pain    . aspirin-acetaminophen-caffeine (EXCEDRIN MIGRAINE) 250-250-65 MG per tablet Take 1 tablet by mouth daily as needed. For migraines    . B Complex Vitamins (B COMPLEX 100 PO) Take by mouth daily.    Marland Kitchen levonorgestrel (MIRENA) 20 MCG/24HR IUD 1 each by Intrauterine route once.    . metroNIDAZOLE (METROGEL) 0.75 % vaginal gel Place one applicator pv at hs twice a week for 6 months. 70 g 5  . NONFORMULARY OR COMPOUNDED ITEM Boric acid suppositories 600 mg. Place 1 suppository vaginally daily for 21 days. 21 each 0  . omeprazole (PRILOSEC) 40 MG capsule Take 1 capsule by mouth as needed.  0  . Vitamin D, Ergocalciferol, (DRISDOL) 50000 units CAPS capsule Take 1 capsule (50,000 Units total) by mouth every 7 (seven) days. 12 capsule 0   No current facility-administered medications for this visit.      ALLERGIES: Other and Percocet [oxycodone-acetaminophen]  Family History  Problem Relation Age of Onset  . Hypertension Father     Social History   Social History  . Marital status: Single    Spouse name: N/A  . Number of children: N/A  . Years of education: N/A   Occupational History  . Not on file.   Social History Main Topics  . Smoking status: Light Tobacco Smoker    Types: Cigars  . Smokeless tobacco: Never Used     Comment: smokes cigars socially  . Alcohol use 0.0 oz/week     Comment: 1-2 drinks per month  . Drug use: No  . Sexual activity: Yes    Partners: Male    Birth control/ protection: IUD     Comment: Mirena IUD inserted 10-31-13   Other Topics Concern  . Not on file   Social History Narrative  . No narrative on file    ROS:  Pertinent items  are noted in HPI.  PHYSICAL EXAMINATION:    There were no vitals taken for this visit.    General appearance: alert, cooperative and appears stated age Head: Normocephalic, without obvious abnormality, atraumatic Neck: no adenopathy, supple, symmetrical, trachea midline and thyroid normal to inspection and palpation Lungs: clear to auscultation bilaterally Breasts: normal appearance, no masses or tenderness, No nipple retraction or dimpling, No nipple discharge or bleeding, No axillary or supraclavicular adenopathy Heart: regular rate and rhythm Abdomen: soft, non-tender, no masses,  no organomegaly Extremities: extremities normal, atraumatic, no cyanosis or edema Skin: Skin color, texture, turgor normal. No rashes or lesions Lymph nodes: Cervical, supraclavicular, and axillary nodes normal. No abnormal inguinal nodes palpated Neurologic: Grossly normal  Pelvic: External genitalia:  no lesions              Urethra:  normal appearing urethra with no masses, tenderness or lesions              Bartholins and Skenes: normal                 Vagina: normal appearing vagina with normal color and discharge, no lesions              Cervix: no lesions                Bimanual Exam:  Uterus:  normal size, contour, position, consistency, mobility, non-tender              Adnexa: no mass, fullness, tenderness              Rectal exam: {yes no:[id number]}.  Confirms.              Anus:  normal sphincter tone, no lesions  Chaperone was present for exam.  ASSESSMENT     PLAN     An After Visit Summary was printed and given to the patient.  ______ minutes face to face time of which over 50% was spent in counseling.

## 2016-08-26 NOTE — Telephone Encounter (Signed)
Patient states she is returning a call to the nurse.

## 2016-08-26 NOTE — Telephone Encounter (Signed)
Thank you for the update!

## 2016-08-26 NOTE — Telephone Encounter (Signed)
Patient canceled wiothout 24 hour notice her appointment for today for med change/ BV and Spotting. Patient states this appointment is not needed. Patient is aware of the cancellation policy.

## 2016-08-27 NOTE — Telephone Encounter (Signed)
Patient needs to be seen for a change in her medication.  She is having her vit D rechecked next week, and I would like to move her appointment to another day next week so I can see her and she can have her blood drawn.

## 2016-08-27 NOTE — Telephone Encounter (Signed)
Spoke with patient, advised as seen below per Dr. Quincy Simmonds. Patient states she is not a work to look at schedule, will need to return call on Monday. Advised patient would leave lab appointment as scheduled until call returned. Patient verbalizes understanding and is agreeable.

## 2016-08-30 NOTE — Telephone Encounter (Signed)
Spoke with patient. Patient declined to schedule OV with Dr. Quincy Simmonds at this time d/t work schedule. Patient states she will plan to keep lab appointment as scheduled. Advised patient would update Dr. Quincy Simmonds. Patient verbalizes understanding.  Routing to provider for final review. Patient is agreeable to disposition. Will close encounter.

## 2016-08-31 ENCOUNTER — Other Ambulatory Visit: Payer: Self-pay

## 2016-08-31 ENCOUNTER — Other Ambulatory Visit (INDEPENDENT_AMBULATORY_CARE_PROVIDER_SITE_OTHER): Payer: BLUE CROSS/BLUE SHIELD

## 2016-08-31 DIAGNOSIS — E559 Vitamin D deficiency, unspecified: Secondary | ICD-10-CM

## 2016-08-31 NOTE — Addendum Note (Signed)
Addended by: Graylon Good on: 08/31/2016 09:05 AM   Modules accepted: Orders

## 2016-09-02 LAB — VITAMIN D 25 HYDROXY (VIT D DEFICIENCY, FRACTURES): Vit D, 25-Hydroxy: 31.3 ng/mL (ref 30.0–100.0)

## 2016-09-03 ENCOUNTER — Encounter: Payer: Self-pay | Admitting: Obstetrics and Gynecology

## 2016-09-14 ENCOUNTER — Other Ambulatory Visit: Payer: Self-pay | Admitting: Obstetrics and Gynecology

## 2016-09-15 DIAGNOSIS — A599 Trichomoniasis, unspecified: Secondary | ICD-10-CM

## 2016-09-15 HISTORY — DX: Trichomoniasis, unspecified: A59.9

## 2016-10-14 ENCOUNTER — Telehealth: Payer: Self-pay | Admitting: Obstetrics and Gynecology

## 2016-10-14 MED ORDER — ACYCLOVIR 800 MG PO TABS: ORAL_TABLET | ORAL | 0 refills | Status: DC

## 2016-10-14 NOTE — Telephone Encounter (Signed)
Patient says she is having an outbreak and would like to possible come in today for std screening.

## 2016-10-14 NOTE — Telephone Encounter (Signed)
Spoke with patient. Reports right side of groin is swollen and is experiencing HSV outbreak. Reports vaginal d/c with odor that started 2 days ago, used metrogel x 2 with no changes. Reports low grade fever and chills on 8/29. Patient states she has no STD concerns, as she has not been SA recently.   Patient request OV and refill of Valtrex. Patient states she has experienced HA, nausea and "just feeling bad" when taking valtrex, would like alternative if possible. Scheduled for OV on 8/31 at 8:15am with Dr. Quincy Simmonds. Advised patient would review with Dr. Quincy Simmonds and return call with recommendations, patient is agreeable.  Dr. Quincy Simmonds -please advise on RX and OV, ok to keep as scheduled?

## 2016-10-14 NOTE — Telephone Encounter (Signed)
Left detailed message, ok per current dpr, name identified on voicemail. Advised as seen below per Dr. Quincy Simmonds. Rx to pharmacy, f/u for filling. Keep appointment as scheduled. Return call to office for any additional questions/concerns.   Patient is agreeable to disposition. Will close encounter.

## 2016-10-14 NOTE — Telephone Encounter (Signed)
Ok for Acyclovir 800 mg po bid for 5 days for recurrent outbreak.  Dispense:  30, RF one.  I will be happy to see her tomorrow.

## 2016-10-14 NOTE — Progress Notes (Signed)
GYNECOLOGY  VISIT   HPI: 31 y.o.   Single  African American  female   [phone] with No LMP recorded. Patient is not currently having periods (Reason: IUD).   here for vaginal discharge with odor, pain with urination   Urine: trace WBC, trace RBC.  Some suprapubic and right sided back pain.  Urinary frequency and urgency.   Having an HSV outbreak.  Has HSV I and II. Had fever, chills, weakness.  Will start Acyclovir. Does not tolerate Valtrex which causes nausea and headache.  No new partners.   Doing a lot of lifting recently.  Recent course of Keflex due to an insect bite of her left arm 2 weeks ago.   Urine dip - trace WBC and trace RBC.  GYNECOLOGIC HISTORY: No LMP recorded. Patient is not currently having periods (Reason: IUD). Contraception:  Mirena inserted 10-31-13  Menopausal hormone therapy:  n/a Last mammogram: 07-05-14 Lt.Br.US--normal/screening age 67/BiRads1:TBC Last pap smear:  08-15-15 Neg:Neg HR HPV;08-03-13 Neg:Neg HR HPV         OB History    Gravida Para Term Preterm AB Living   3 2 2  0 1 2   SAB TAB Ectopic Multiple Live Births   0 1 0 0 2         Patient Active Problem List   Diagnosis Date Noted  . GERD (gastroesophageal reflux disease) 03/09/2014  . Altered bowel habits 03/09/2014  . Bloating 03/09/2014  . Helicobacter pylori ab+ 03/09/2014  . 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 Menorrhagia 03/29/2013  . General counseling for prescription of oral contraceptives 03/29/2013  . Abdominal hernia 09/29/2011    Past Medical History:  Diagnosis Date  . Abdominal hernia   . Abnormal Pap smear of cervix 2005   HPV +  . Anemia   . Anxiety   . Chlamydia 08/18/2015   repeat testing negative October 2017.  Marland Kitchen Condyloma acuminatum of vulva   . Depression    but doesn't require meds  . GERD (gastroesophageal reflux disease)    only takes OTC meds  . History of bronchitis 3+yrs ago  . History of migraine 09/20/11-last one  . HSV-1 (herpes  simplex virus 1) infection   . HSV-2 (herpes simplex virus 2) infection   . Hyperlipidemia    postpartum 2+yrs ago  . Hypertension    postpartum 2+yrs ago  . Low vitamin D level 2018  . Sickle cell trait [facility])     Past Surgical History:  Procedure Laterality Date  . ABDOMINOPLASTY  09/29/2011   Procedure: ABDOMINOPLASTY;  Surgeon: Theodoro Kos, DO;  Location: [place];  Service: Plastics;  Laterality: N/A;  ABDOMINOPLASTY FOR REPAIR OF RECTUS DIASTASIS  . EYE SURGERY  1990's  . OVARIAN CYST REMOVAL  2012  . VENTRAL HERNIA REPAIR  09/29/2011   Procedure: HERNIA REPAIR VENTRAL ADULT;  Surgeon: Joyice Faster. Cornett, MD;  Location: [place] [facility];  Service: General;  Laterality: N/A;    Current Outpatient Prescriptions  Medication Sig Dispense Refill  . acetaminophen (TYLENOL) 500 MG tablet Take 1,000 mg by mouth every 6 (six) hours as needed. For pain    . acyclovir (ZOVIRAX) 800 MG tablet Take one tablet (800mg ) by mouth bid for 5 days for recurrent outbreak. 30 tablet 0  . aspirin-acetaminophen-caffeine (EXCEDRIN MIGRAINE) 465-681-27 MG per tablet Take 1 tablet by mouth daily as needed. For migraines    . B Complex Vitamins (B COMPLEX 100 PO) Take by mouth daily.    Marland Kitchen levonorgestrel (MIRENA)  20 MCG/24HR IUD 1 each by Intrauterine route once.    . metroNIDAZOLE (METROGEL) 0.75 % vaginal gel Place one applicator pv at hs twice a week for 6 months. 70 g 5  . NONFORMULARY OR COMPOUNDED ITEM Boric acid suppositories 600 mg. Place 1 suppository vaginally daily for 21 days. 21 each 0  . omeprazole (PRILOSEC) 40 MG capsule Take 1 capsule by mouth as needed.  0   No current facility-administered medications for this visit.      ALLERGIES: Other and Percocet [oxycodone-acetaminophen]  Family History  Problem Relation Age of Onset  . Hypertension Father     Social History   Social History  . Marital status: Single    Spouse name: N/A  . Number of children: N/A  . Years of education: N/A    Occupational History  . Not on file.   Social History Main Topics  . Smoking status: Former Smoker    Types: Cigars  . Smokeless tobacco: Never Used  . Alcohol use 0.0 oz/week     Comment: 1-2 drinks per month  . Drug use: No  . Sexual activity: Yes    Partners: Male    Birth control/ protection: IUD     Comment: Mirena IUD inserted 10-31-13   Other Topics Concern  . Not on file   Social History Narrative  . No narrative on file    ROS:  Pertinent items are noted in HPI.  PHYSICAL EXAMINATION:    BP 118/70 (BP Location: Right Arm, Patient Position: Sitting, Cuff Size: Normal)   Pulse 80   Resp 16   Wt 155 lb (70.3 kg)   BMI 28.35 kg/m     General appearance: alert, cooperative and appears stated age   Abdomen: soft, non-tender, no masses,  no organomegaly Back:  No CVA tenderness.   Pelvic: External genitalia:   Satellite lesions on right labia majora.              Urethra:  normal appearing urethra with no masses, tenderness or lesions              Bartholins and Skenes: normal                 Vagina: normal appearing vagina with normal color and discharge, no lesions              Cervix: no lesions.  IUD strings seen.  No CMT.                Bimanual Exam:  Uterus:  normal size, contour, position, consistency, mobility, non-tender              Adnexa: no mass, fullness, tenderness            Chaperone was present for exam.  ASSESSMENT  Vaginal odor.  Dysuria.  UTI. HSV outbreak.  Intolerance to Valtrex.   PLAN  Urine micro and culture.  Bactrim DS po bid x 3 days.  I did give an Rs for Diflucan.  GC/CT.  Affirm.  Will wait for Affirm results before tx for any BV.   Has Rx for Acyclovir.  Rx for Xylocaine jelly to use tid prn.   An After Visit Summary was printed and given to the patient.  __15____ minutes face to face time of which over 50% was spent in counseling.

## 2016-10-15 ENCOUNTER — Ambulatory Visit (INDEPENDENT_AMBULATORY_CARE_PROVIDER_SITE_OTHER): Payer: BLUE CROSS/BLUE SHIELD | Admitting: Obstetrics and Gynecology

## 2016-10-15 ENCOUNTER — Encounter: Payer: Self-pay | Admitting: Obstetrics and Gynecology

## 2016-10-15 VITALS — BP 118/70 | HR 80 | Resp 16 | Wt 155.0 lb

## 2016-10-15 DIAGNOSIS — A599 Trichomoniasis, unspecified: Secondary | ICD-10-CM | POA: Diagnosis not present

## 2016-10-15 DIAGNOSIS — N898 Other specified noninflammatory disorders of vagina: Secondary | ICD-10-CM

## 2016-10-15 DIAGNOSIS — R309 Painful micturition, unspecified: Secondary | ICD-10-CM | POA: Diagnosis not present

## 2016-10-15 LAB — POCT URINALYSIS DIPSTICK
Bilirubin, UA: NEGATIVE
Glucose, UA: NEGATIVE
Ketones, UA: NEGATIVE
Nitrite, UA: NEGATIVE
Protein, UA: NEGATIVE
Urobilinogen, UA: 0.2 E.U./dL
pH, UA: 5 (ref 5.0–8.0)

## 2016-10-15 MED ORDER — FLUCONAZOLE 150 MG PO TABS: 150.0000 mg | ORAL_TABLET | Freq: Once | ORAL | 0 refills | Status: AC

## 2016-10-15 MED ORDER — LIDOCAINE HCL 2 % EX GEL
1.0000 | Freq: Three times a day (TID) | CUTANEOUS | 0 refills | Status: DC
Start: 2016-10-15 — End: 2017-12-27

## 2016-10-15 MED ORDER — SULFAMETHOXAZOLE-TRIMETHOPRIM 800-160 MG PO TABS: 1.0000 | ORAL_TABLET | Freq: Two times a day (BID) | ORAL | 0 refills | Status: DC

## 2016-10-16 ENCOUNTER — Encounter: Payer: Self-pay | Admitting: Obstetrics and Gynecology

## 2016-10-16 LAB — URINALYSIS, MICROSCOPIC ONLY
Bacteria, UA: NONE SEEN
Casts: NONE SEEN /lpf

## 2016-10-16 LAB — VAGINITIS/VAGINOSIS, DNA PROBE
Candida Species: NEGATIVE
Gardnerella vaginalis: POSITIVE — AB
Trichomonas vaginosis: POSITIVE — AB

## 2016-10-16 MED ORDER — METRONIDAZOLE 500 MG PO TABS: 500.0000 mg | ORAL_TABLET | Freq: Two times a day (BID) | ORAL | 0 refills | Status: DC

## 2016-10-16 NOTE — Addendum Note (Signed)
Addended by: Yisroel Ramming, BROOK E on: 10/16/2016 11:14 AM   Modules accepted: Orders

## 2016-10-17 LAB — GC/CHLAMYDIA PROBE AMP
Chlamydia trachomatis, NAA: NEGATIVE
Neisseria gonorrhoeae by PCR: NEGATIVE

## 2016-10-17 LAB — URINE CULTURE

## 2016-10-20 ENCOUNTER — Other Ambulatory Visit (INDEPENDENT_AMBULATORY_CARE_PROVIDER_SITE_OTHER): Payer: BLUE CROSS/BLUE SHIELD

## 2016-10-20 ENCOUNTER — Telehealth: Payer: Self-pay | Admitting: Obstetrics and Gynecology

## 2016-10-20 DIAGNOSIS — A599 Trichomoniasis, unspecified: Secondary | ICD-10-CM

## 2016-10-20 NOTE — Telephone Encounter (Signed)
Called patient to get 3 week recheck scheduled and she will call back to schedule.

## 2016-10-21 LAB — HEP, RPR, HIV PANEL
HIV Screen 4th Generation wRfx: NONREACTIVE
Hepatitis B Surface Ag: NEGATIVE
RPR Ser Ql: NONREACTIVE

## 2016-10-21 LAB — HEPATITIS C ANTIBODY: Hep C Virus Ab: <0.1 s/co ratio (ref 0.0–0.9)

## 2016-10-21 NOTE — Telephone Encounter (Signed)
Patient wants to speak with the nurse no information given. °

## 2016-10-21 NOTE — Telephone Encounter (Signed)
Returned patient's call and LMOVM to call me back.

## 2016-10-21 NOTE — Telephone Encounter (Signed)
Spoke with patient and made vaginitis recheck appointment for 11-12-16 at 8:15am.

## 2016-11-12 ENCOUNTER — Ambulatory Visit (INDEPENDENT_AMBULATORY_CARE_PROVIDER_SITE_OTHER): Payer: BLUE CROSS/BLUE SHIELD | Admitting: Obstetrics and Gynecology

## 2016-11-12 ENCOUNTER — Encounter: Payer: Self-pay | Admitting: Obstetrics and Gynecology

## 2016-11-12 VITALS — BP 104/64 | HR 76 | Ht 62.0 in | Wt 156.2 lb

## 2016-11-12 DIAGNOSIS — R350 Frequency of micturition: Secondary | ICD-10-CM

## 2016-11-12 DIAGNOSIS — A599 Trichomoniasis, unspecified: Secondary | ICD-10-CM

## 2016-11-12 DIAGNOSIS — R102 Pelvic and perineal pain: Secondary | ICD-10-CM

## 2016-11-12 DIAGNOSIS — R319 Hematuria, unspecified: Secondary | ICD-10-CM

## 2016-11-12 LAB — POCT URINALYSIS DIPSTICK
Bilirubin, UA: NEGATIVE
Glucose, UA: NEGATIVE
Ketones, UA: NEGATIVE
Leukocytes, UA: NEGATIVE
Nitrite, UA: NEGATIVE
Protein, UA: NEGATIVE
Urobilinogen, UA: 0.2 E.U./dL
pH, UA: 5 (ref 5.0–8.0)

## 2016-11-12 LAB — POCT URINE PREGNANCY: Preg Test, Ur: NEGATIVE

## 2016-11-12 NOTE — Progress Notes (Signed)
GYNECOLOGY  VISIT   HPI: 31 y.o.   Single  African American  female   [phone] with No LMP recorded. Patient is not currently having periods (Reason: IUD).   here for follow up. See on on 10/15/16 for vaginal discharge and pain with urination.  She was treated for trichomonas and BV with Flagyl.  Partner was treated.  GC/CT were both negative.  She had a small amount of GBS in her urine which was not treated.   Patient complaining of urinary frequency. Denies dysuria. She states having to hold urine because unable to empty as often as she needs to.   States urinary frequency all the time, for a year. No dysuria.  Some sodas.  Drinks 5 - 6 12 ounce glasses water daily.  DF - wants to every 30 - 40 minutes but does not go that often. NF - once last hs.  Usually 2 times per night.  Som urinary incontinence with cough/sneeze and spontaneously. On 05/28/16, HgbA1C was 5.1   Having unscheduled bleeding/spotting for 2 weeks.  Thinks she had a period this last week, out of her cycle time.  Had cramping.   Some bleeding and pain with intercourse occurs sometimes.  Thinking about having the IUD removed.  May want pregnancy.  Also may want it removed due to some discomfort. States currently she can live with the cramping.   ROS otherwise negative.   Urine Dip:  RBCs.    GYNECOLOGIC HISTORY: No LMP recorded. Patient is not currently having periods (Reason: IUD). Contraception: Mirena IUD inserted 10-31-13 Menopausal hormone therapy:  n/a Last mammogram:  07-05-14 [facility]--normal/screening age 72/BiRads1:TBC Last pap smear: 08-15-15 Neg:Neg HR HPV;08-03-13 Neg:Neg HR HPV         OB History    Gravida Para Term Preterm AB Living   3 2 2  0 1 2   SAB TAB Ectopic Multiple Live Births   0 1 0 0 2         Patient Active Problem List   Diagnosis Date Noted  . GERD (gastroesophageal reflux disease) 03/09/2014  . Altered bowel habits 03/09/2014  . Bloating 03/09/2014  . Helicobacter  pylori ab+ 03/09/2014  . 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 Menorrhagia 03/29/2013  . General counseling for prescription of oral contraceptives 03/29/2013  . Abdominal hernia 09/29/2011    Past Medical History:  Diagnosis Date  . Abdominal hernia   . Abnormal Pap smear of cervix 2005   HPV +  . Anemia   . Anxiety   . Chlamydia 08/18/2015   repeat testing negative October 2017.  Marland Kitchen Condyloma acuminatum of vulva   . Depression    but doesn't require meds  . GERD (gastroesophageal reflux disease)    only takes OTC meds  . History of bronchitis 3+yrs ago  . History of migraine 09/20/11-last one  . HSV-1 (herpes simplex virus 1) infection   . HSV-2 (herpes simplex virus 2) infection   . Hyperlipidemia    postpartum 2+yrs ago  . Hypertension    postpartum 2+yrs ago  . Low vitamin D level 2018  . Sickle cell trait ([place])   . Trichomonas infection 09/2016    Past Surgical History:  Procedure Laterality Date  . ABDOMINOPLASTY  09/29/2011   Procedure: ABDOMINOPLASTY;  Surgeon: Theodoro Kos, DO;  Location: [facility];  Service: Plastics;  Laterality: N/A;  ABDOMINOPLASTY FOR REPAIR OF RECTUS DIASTASIS  . EYE SURGERY  1990's  . OVARIAN CYST REMOVAL  2012  .  VENTRAL HERNIA REPAIR  09/29/2011   Procedure: HERNIA REPAIR VENTRAL ADULT;  Surgeon: Joyice Faster. Cornett, MD;  Location: [facility];  Service: General;  Laterality: N/A;    Current Outpatient Prescriptions  Medication Sig Dispense Refill  . acetaminophen (TYLENOL) 500 MG tablet Take 1,000 mg by mouth every 6 (six) hours as needed. For pain    . aspirin-acetaminophen-caffeine (EXCEDRIN MIGRAINE) 250-250-65 MG per tablet Take 1 tablet by mouth daily as needed. For migraines    . B Complex Vitamins (B COMPLEX 100 PO) Take by mouth daily.    Marland Kitchen levonorgestrel (MIRENA) 20 MCG/24HR IUD 1 each by Intrauterine route once.    . metroNIDAZOLE (METROGEL) 0.75 % vaginal gel Place one applicator pv at hs twice a week for 6 months. 70 g 5  .  NONFORMULARY OR COMPOUNDED ITEM Boric acid suppositories 600 mg. Place 1 suppository vaginally daily for 21 days. 21 each 0  . omeprazole (PRILOSEC) 40 MG capsule Take 1 capsule by mouth as needed.  0  . ondansetron (ZOFRAN-ODT) 4 MG disintegrating tablet Take 1 tablet by mouth as needed.  0  . lidocaine (XYLOCAINE) 2 % jelly Apply 1 application topically 3 (three) times daily. (Patient not taking: Reported on 11/12/2016) 30 mL 0   No current facility-administered medications for this visit.      ALLERGIES: Other; Percocet [oxycodone-acetaminophen]; and Valtrex [valacyclovir hcl]  Family History  Problem Relation Age of Onset  . Hypertension Father     Social History   Social History  . Marital status: Single    Spouse name: N/A  . Number of children: N/A  . Years of education: N/A   Occupational History  . Not on file.   Social History Main Topics  . Smoking status: Former Smoker    Types: Cigars  . Smokeless tobacco: Never Used  . Alcohol use 0.0 oz/week     Comment: 1-2 drinks per month  . Drug use: No  . Sexual activity: Yes    Partners: Male    Birth control/ protection: IUD     Comment: Mirena IUD inserted 10-31-13   Other Topics Concern  . Not on file   Social History Narrative  . No narrative on file    ROS:  Pertinent items are noted in HPI.  PHYSICAL EXAMINATION:    BP 104/64 (BP Location: Right Arm, Patient Position: Sitting, Cuff Size: Normal)   Pulse 76   Ht 5\' 2"  (1.575 m)   Wt 156 lb 3.2 oz (70.9 kg)   BMI 28.57 kg/m     General appearance: alert, cooperative and appears stated age   Pelvic: External genitalia:  no lesions              Urethra:  normal appearing urethra with no masses, tenderness or lesions              Bartholins and Skenes: normal                 Vagina: normal appearing vagina with normal color and discharge, no lesions              Cervix: no lesions.  IUD strings noted.  No CMT.                 Bimanual Exam:  Uterus:   normal size, contour, position, consistency, mobility, non-tender.  Tender to palpation over the bladder.              Adnexa: no mass, fullness,  tenderness              Chaperone was present for exam.  ASSESSMENT  Urinary frequency.  Hematuria.  Recent trichomonas and bacterial vaginosis.  Mirena IUD. Pelvic cramping.   PLAN  Urine micro and culture. Affirm.  UPT. Motrin 800 mg po q 8 hours prn or Aleve 2 po bid prn.  Heating pad.  She declines medication for overactive bladder.  We talked about bladder irritants. Already on MVI with folic acid 588 mcg. She will let me know if she wants Mirena removed.  I told her she will have essentially immediate return to fertility and resumption of how her cycles used to be.     An After Visit Summary was printed and given to the patient.  ___15___ minutes face to face time of which over 50% was spent in counseling.

## 2016-11-12 NOTE — Addendum Note (Signed)
Addended by: Polly Cobia on: 11/12/2016 09:11 AM   Modules accepted: Orders

## 2016-11-12 NOTE — Addendum Note (Signed)
Addended by: Lowella Fairy on: 11/12/2016 09:23 AM   Modules accepted: Orders

## 2016-11-13 LAB — URINALYSIS, MICROSCOPIC ONLY
Bacteria, UA: NONE SEEN
Casts: NONE SEEN /lpf

## 2016-11-13 LAB — URINE CULTURE

## 2016-11-13 LAB — VAGINITIS/VAGINOSIS, DNA PROBE
Candida Species: NEGATIVE
Gardnerella vaginalis: NEGATIVE
Trichomonas vaginosis: NEGATIVE

## 2016-12-28 ENCOUNTER — Other Ambulatory Visit: Payer: Self-pay

## 2016-12-28 ENCOUNTER — Encounter: Payer: Self-pay | Admitting: Obstetrics and Gynecology

## 2016-12-28 ENCOUNTER — Ambulatory Visit (INDEPENDENT_AMBULATORY_CARE_PROVIDER_SITE_OTHER): Payer: BLUE CROSS/BLUE SHIELD | Admitting: Obstetrics and Gynecology

## 2016-12-28 VITALS — BP 138/90 | HR 84 | Resp 14 | Wt 156.0 lb

## 2016-12-28 DIAGNOSIS — N3281 Overactive bladder: Secondary | ICD-10-CM | POA: Diagnosis not present

## 2016-12-28 DIAGNOSIS — Z113 Encounter for screening for infections with a predominantly sexual mode of transmission: Secondary | ICD-10-CM

## 2016-12-28 DIAGNOSIS — R350 Frequency of micturition: Secondary | ICD-10-CM | POA: Diagnosis not present

## 2016-12-28 DIAGNOSIS — N949 Unspecified condition associated with female genital organs and menstrual cycle: Secondary | ICD-10-CM | POA: Diagnosis not present

## 2016-12-28 DIAGNOSIS — R3915 Urgency of urination: Secondary | ICD-10-CM

## 2016-12-28 LAB — POCT URINALYSIS DIPSTICK
Bilirubin, UA: NEGATIVE
Blood, UA: NEGATIVE
Glucose, UA: NEGATIVE
Ketones, UA: NEGATIVE
Leukocytes, UA: NEGATIVE
Nitrite, UA: NEGATIVE
Protein, UA: NEGATIVE
Urobilinogen, UA: NEGATIVE E.U./dL — AB
pH, UA: 6.5 (ref 5.0–8.0)

## 2016-12-28 MED ORDER — OXYBUTYNIN CHLORIDE ER 5 MG PO TB24: 5.0000 mg | ORAL_TABLET | Freq: Every day | ORAL | 1 refills | Status: DC

## 2016-12-28 NOTE — Progress Notes (Signed)
GYNECOLOGY  VISIT   HPI: 31 y.o.   Single  African American  female   [phone] with No LMP recorded. Patient is not currently having periods (Reason: IUD).   here c/o urinary urgency and frequency   In 8/18 the patient was treated for trich and BV. Partner was also treated. She had negative GC/CT testing. She had some urinary c/o last month and had a negative urine culture, she also had a negative Affirm.  She c/o a 2-3 week h/o suprapubic pressure and intermittent mild cramping with a full bladder. Voiding frequently, urgency to void. Feels she empties her bladder, sometimes double voids. No caffeine intake. These are the same symptoms she had a few months ago.  No vaginal discharge, actually less than normal. No itching, burning or irritation. No vaginal odor. When she gets BV she usually has a sticky vaginal d/c, not always with an odor. She was on metrogel suppression. She hasn't been very sexually active (same partner), so she has backed off on the metrogel.   GYNECOLOGIC HISTORY: No LMP recorded. Patient is not currently having periods (Reason: IUD). Contraception:IUD Menopausal hormone therapy: none         OB History    Gravida Para Term Preterm AB Living   3 2 2  0 1 2   SAB TAB Ectopic Multiple Live Births   0 1 0 0 2         Patient Active Problem List   Diagnosis Date Noted  . GERD (gastroesophageal reflux disease) 03/09/2014  . Altered bowel habits 03/09/2014  . Bloating 03/09/2014  . Helicobacter pylori ab+ 03/09/2014  . 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 Menorrhagia 03/29/2013  . General counseling for prescription of oral contraceptives 03/29/2013  . Abdominal hernia 09/29/2011    Past Medical History:  Diagnosis Date  . Abdominal hernia   . Abnormal Pap smear of cervix 2005   HPV +  . Anemia   . Anxiety   . Chlamydia 08/18/2015   repeat testing negative October 2017.  Marland Kitchen Condyloma acuminatum of vulva   . Depression    but doesn't require meds  .  GERD (gastroesophageal reflux disease)    only takes OTC meds  . History of bronchitis 3+yrs ago  . History of migraine 09/20/11-last one  . HSV-1 (herpes simplex virus 1) infection   . HSV-2 (herpes simplex virus 2) infection   . Hyperlipidemia    postpartum 2+yrs ago  . Hypertension    postpartum 2+yrs ago  . Low vitamin D level 2018  . Sickle cell trait ([place])   . Trichomonas infection 09/2016    Past Surgical History:  Procedure Laterality Date  . EYE SURGERY  1990's  . OVARIAN CYST REMOVAL  2012    Current Outpatient Medications  Medication Sig Dispense Refill  . acetaminophen (TYLENOL) 500 MG tablet Take 1,000 mg by mouth every 6 (six) hours as needed. For pain    . aspirin-acetaminophen-caffeine (EXCEDRIN MIGRAINE) 250-250-65 MG per tablet Take 1 tablet by mouth daily as needed. For migraines    . B Complex Vitamins (B COMPLEX 100 PO) Take by mouth daily.    Marland Kitchen levonorgestrel (MIRENA) 20 MCG/24HR IUD 1 each by Intrauterine route once.    . lidocaine (XYLOCAINE) 2 % jelly Apply 1 application topically 3 (three) times daily. 30 mL 0  . metroNIDAZOLE (METROGEL) 0.75 % vaginal gel Place one applicator pv at hs twice a week for 6 months. 70 g 5  . NONFORMULARY  OR COMPOUNDED ITEM Boric acid suppositories 600 mg. Place 1 suppository vaginally daily for 21 days. 21 each 0  . omeprazole (PRILOSEC) 40 MG capsule Take 1 capsule by mouth as needed.  0  . ondansetron (ZOFRAN-ODT) 4 MG disintegrating tablet Take 1 tablet by mouth as needed.  0   No current facility-administered medications for this visit.      ALLERGIES: Other; Percocet [oxycodone-acetaminophen]; and Valtrex [valacyclovir hcl]  Family History  Problem Relation Age of Onset  . Hypertension Father     Social History   Socioeconomic History  . Marital status: Single    Spouse name: Not on file  . Number of children: Not on file  . Years of education: Not on file  . Highest education level: Not on file  Social  Needs  . Financial resource strain: Not on file  . Food insecurity - worry: Not on file  . Food insecurity - inability: Not on file  . Transportation needs - medical: Not on file  . Transportation needs - non-medical: Not on file  Occupational History  . Not on file  Tobacco Use  . Smoking status: Former Smoker    Types: Cigars  . Smokeless tobacco: Never Used  Substance and Sexual Activity  . Alcohol use: Yes    Alcohol/week: 0.0 oz    Comment: 1-2 drinks per month  . Drug use: No  . Sexual activity: Yes    Partners: Male    Birth control/protection: IUD    Comment: Mirena IUD inserted 10-31-13  Other Topics Concern  . Not on file  Social History Narrative  . Not on file    Review of Systems  Constitutional: Negative.   HENT: Negative.   Eyes: Negative.   Respiratory: Negative.   Cardiovascular: Negative.   Gastrointestinal: Negative.   Genitourinary: Positive for frequency and urgency.       Lower abdominal pressure  Musculoskeletal: Negative.   Skin: Negative.   Neurological: Negative.   Endo/Heme/Allergies: Negative.   Psychiatric/Behavioral: Negative.     PHYSICAL EXAMINATION:    BP 138/90 (BP Location: Right Arm, Patient Position: Sitting, Cuff Size: Normal)   Pulse 84   Resp 14   Wt 156 lb (70.8 kg)   BMI 28.53 kg/m     General appearance: alert, cooperative and appears stated age Abdomen: soft, mildly tender in the SP region, no rebound, no guarding; non distended, no masses,  no organomegaly  Pelvic: External genitalia:  no lesions              Urethra:  normal appearing urethra with no masses, tenderness or lesions              Bartholins and Skenes: normal                 Vagina: normal appearing vagina with normal color and discharge, no lesions              Cervix: no cervical motion tenderness, no lesions and IUD string 3 cm              Bimanual Exam:  Uterus:  retroverted, mobile, mildly tender.               Adnexa: no mass, fullness,  tenderness              Bladder: very tender to palpation  Chaperone was present for exam.  ASSESSMENT Overactive bladder symptoms, prior negative urine culture, negative urine dip Uterus mildly tender, patient  desires repeat genprobe    PLAN Will send urine for ua, c&s We discussed avoiding bladder irritants Will start a trial of ditropan F/U in one month with Dr Quincy Simmonds   An After Visit Summary was printed and given to the patient.  ~15 minutes face to face time of which over 50% was spent in counseling.   CC: Dr Quincy Simmonds

## 2016-12-29 LAB — URINALYSIS, MICROSCOPIC ONLY
Casts: NONE SEEN /lpf
RBC, UA: NONE SEEN /hpf (ref 0–?)

## 2016-12-29 LAB — URINE CULTURE

## 2016-12-29 LAB — GC/CHLAMYDIA PROBE AMP
Chlamydia trachomatis, NAA: NEGATIVE
Neisseria gonorrhoeae by PCR: NEGATIVE

## 2017-02-13 ENCOUNTER — Other Ambulatory Visit: Payer: Self-pay | Admitting: Obstetrics and Gynecology

## 2017-03-23 ENCOUNTER — Ambulatory Visit: Payer: BLUE CROSS/BLUE SHIELD | Admitting: Obstetrics and Gynecology

## 2017-03-23 ENCOUNTER — Telehealth: Payer: Self-pay | Admitting: Obstetrics and Gynecology

## 2017-03-23 NOTE — Telephone Encounter (Signed)
Patient cancelled appointment today. See separate staff message. °

## 2017-03-23 NOTE — Telephone Encounter (Signed)
Thank you for the update.  Encounter closed. 

## 2017-03-25 ENCOUNTER — Other Ambulatory Visit: Payer: Self-pay

## 2017-03-25 ENCOUNTER — Ambulatory Visit (INDEPENDENT_AMBULATORY_CARE_PROVIDER_SITE_OTHER): Payer: PRIVATE HEALTH INSURANCE | Admitting: Certified Nurse Midwife

## 2017-03-25 ENCOUNTER — Encounter: Payer: Self-pay | Admitting: Certified Nurse Midwife

## 2017-03-25 VITALS — BP 120/80 | HR 70 | Resp 16 | Ht 63.0 in | Wt 163.0 lb

## 2017-03-25 DIAGNOSIS — Z113 Encounter for screening for infections with a predominantly sexual mode of transmission: Secondary | ICD-10-CM | POA: Diagnosis not present

## 2017-03-25 DIAGNOSIS — Z30431 Encounter for routine checking of intrauterine contraceptive device: Secondary | ICD-10-CM | POA: Diagnosis not present

## 2017-03-25 NOTE — Progress Notes (Signed)
32 y.o. Single African American female [phone] here with complaint of vaginal symptoms of discharge change. Describes discharge as scant amount and sticky feel, which is not her normal. She has been treating with Metrogel as needed 1-2 x weekly to treat chronic BV and used Boric acid capsules as needed.Has not noted the normal odor with BV or discharge. This discharge is different and wants to check to see if BV not present now. New partner also,and  desires STD screening... Denies new personal products . Urinary symptoms  none . Contraception is Mirena IUD.  ROS Pertinent to HPI.  O:Healthy female WDWN Affect: normal, orientation x 3  Exam: Skin: warm and dry Abdomen: soft, non tender  Inguinal Lymph nodes: no enlargement or tenderness Pelvic exam: External genital: normal female, no lesions or scaling or exudate BUS: negative Vagina: scant normal appearing white non odorous discharge noted.  Affirm taken Cervix: normal, non tender, no CMT, IUD string noted in cervix Uterus: normal, non tender Adnexa:normal, non tender, no masses or fullness noted   A:Normal pelvic exam History of Chronic BV with Metrogel treatment and Boric Acid capsules prn R/O vaginal infection Contraception Mirena IUD STD screening   P:Discussed findings of normal pelvic exam and IUD surveillance. Discussed discharge appearance and shown to patient during the exam with mirror use. Questions addressed. Stop Metrogel and Boric acid use at this point and will wait on labs. Discussed condom use for STD prevention. Warning signs with IUD use and infection given. Labs: Affirm, GC/Chlamydia,STD panel, Hep C  Rv prn

## 2017-03-26 LAB — VAGINITIS/VAGINOSIS, DNA PROBE
Candida Species: NEGATIVE
Gardnerella vaginalis: NEGATIVE
Trichomonas vaginosis: NEGATIVE

## 2017-03-26 LAB — HEP, RPR, HIV PANEL
HIV Screen 4th Generation wRfx: NONREACTIVE
Hepatitis B Surface Ag: NEGATIVE
RPR Ser Ql: NONREACTIVE

## 2017-03-26 LAB — HEPATITIS C ANTIBODY: Hep C Virus Ab: <0.1 s/co ratio (ref 0.0–0.9)

## 2017-03-31 LAB — CHLAMYDIA/GC NAA, CONFIRMATION
Chlamydia trachomatis, NAA: NEGATIVE
Neisseria gonorrhoeae, NAA: NEGATIVE

## 2017-07-19 ENCOUNTER — Other Ambulatory Visit: Payer: Self-pay

## 2017-07-19 ENCOUNTER — Ambulatory Visit: Payer: Managed Care, Other (non HMO) | Admitting: Obstetrics and Gynecology

## 2017-07-19 ENCOUNTER — Encounter: Payer: Self-pay | Admitting: Obstetrics and Gynecology

## 2017-07-19 VITALS — BP 112/62 | HR 68 | Temp 98.2°F | Resp 16 | Ht 63.0 in | Wt 158.0 lb

## 2017-07-19 DIAGNOSIS — N3946 Mixed incontinence: Secondary | ICD-10-CM

## 2017-07-19 DIAGNOSIS — R350 Frequency of micturition: Secondary | ICD-10-CM | POA: Diagnosis not present

## 2017-07-19 DIAGNOSIS — N912 Amenorrhea, unspecified: Secondary | ICD-10-CM

## 2017-07-19 LAB — POCT URINALYSIS DIPSTICK
Bilirubin, UA: NEGATIVE
Glucose, UA: NEGATIVE
Ketones, UA: NEGATIVE
Nitrite, UA: POSITIVE
Protein, UA: NEGATIVE
Urobilinogen, UA: 0.2 E.U./dL
pH, UA: 6 (ref 5.0–8.0)

## 2017-07-19 LAB — POCT URINE PREGNANCY: Preg Test, Ur: NEGATIVE

## 2017-07-19 MED ORDER — SULFAMETHOXAZOLE-TRIMETHOPRIM 800-160 MG PO TABS: 1.0000 | ORAL_TABLET | Freq: Two times a day (BID) | ORAL | 0 refills | Status: DC

## 2017-07-19 MED ORDER — OXYBUTYNIN CHLORIDE ER 5 MG PO TB24: 5.0000 mg | ORAL_TABLET | Freq: Every day | ORAL | 1 refills | Status: DC

## 2017-07-19 NOTE — Patient Instructions (Addendum)
Urinary Tract Infection, Adult A urinary tract infection (UTI) is an infection of any part of the urinary tract. The urinary tract includes the:  Kidneys.  Ureters.  Bladder.  Urethra.  These organs make, store, and get rid of pee (urine) in the body. Follow these instructions at home:  Take over-the-counter and prescription medicines only as told by your doctor.  If you were prescribed an antibiotic medicine, take it as told by your doctor. Do not stop taking the antibiotic even if you start to feel better.  Avoid the following drinks: ? Alcohol. ? Caffeine. ? Tea. ? Carbonated drinks.  Drink enough fluid to keep your pee clear or pale yellow.  Keep all follow-up visits as told by your doctor. This is important.  Make sure to: ? Empty your bladder often and completely. Do not to hold pee for long periods of time. ? Empty your bladder before and after sex. ? Wipe from front to back after a bowel movement if you are female. Use each tissue one time when you wipe. Contact a doctor if:  You have back pain.  You have a fever.  You feel sick to your stomach (nauseous).  You throw up (vomit).  Your symptoms do not get better after 3 days.  Your symptoms go away and then come back. Get help right away if:  You have very bad back pain.  You have very bad lower belly (abdominal) pain.  You are throwing up and cannot keep down any medicines or water. This information is not intended to replace advice given to you by your health care provider. Make sure you discuss any questions you have with your health care provider. Document Released: 07/21/2007 Document Revised: 07/10/2015 Document Reviewed: 12/23/2014 Elsevier Interactive Patient Education  2018 [employer].    Oxybutynin extended-release tablets What is this medicine? OXYBUTYNIN (ox i BYOO ti nin) is used to treat overactive bladder. This medicine reduces the amount of bathroom visits. It may also help to  control wetting accidents. This medicine may be used for other purposes; ask your health care provider or pharmacist if you have questions. COMMON BRAND NAME(S): Ditropan XL What should I tell my health care provider before I take this medicine? They need to know if you have any of these conditions: -autonomic neuropathy -dementia -difficulty passing urine -glaucoma -intestinal obstruction -kidney disease -liver disease -myasthenia gravis -Parkinson's disease -an unusual or allergic reaction to oxybutynin, other medicines, foods, dyes, or preservatives -pregnant or trying to get pregnant -breast-feeding How should I use this medicine? Take this medicine by mouth with a glass of water. Swallow whole, do not crush, cut, or chew. Follow the directions on the prescription label. You can take this medicine with or without food. Take your doses at regular intervals. Do not take your medicine more often than directed. Talk to your pediatrician regarding the use of this medicine in children. Special care may be needed. While this drug may be prescribed for children as young as 6 years for selected conditions, precautions do apply. Overdosage: If you think you have taken too much of this medicine contact a poison control center or emergency room at once. NOTE: This medicine is only for you. Do not share this medicine with others. What if I miss a dose? If you miss a dose, take it as soon as you can. If it is almost time for your next dose, take only that dose. Do not take double or extra doses. What may interact with  this medicine? -antihistamines for allergy, cough and cold -atropine -certain medicines for bladder problems like oxybutynin, tolterodine -certain medicines for Parkinson's disease like benztropine, trihexyphenidyl -certain medicines for stomach problems like dicyclomine, hyoscyamine -certain medicines for travel sickness like  scopolamine -clarithromycin -erythromycin -ipratropium -medicines for fungal infections, like fluconazole, itraconazole, ketoconazole or voriconazole This list may not describe all possible interactions. Give your health care provider a list of all the medicines, herbs, non-prescription drugs, or dietary supplements you use. Also tell them if you smoke, drink alcohol, or use illegal drugs. Some items may interact with your medicine. What should I watch for while using this medicine? It may take a few weeks to notice the full benefit from this medicine. You may need to limit your intake tea, coffee, caffeinated sodas, and alcohol. These drinks may make your symptoms worse. You may get drowsy or dizzy. Do not drive, use machinery, or do anything that needs mental alertness until you know how this medicine affects you. Do not stand or sit up quickly, especially if you are an older patient. This reduces the risk of dizzy or fainting spells. Alcohol may interfere with the effect of this medicine. Avoid alcoholic drinks. Your mouth may get dry. Chewing sugarless gum or sucking hard candy, and drinking plenty of water may help. Contact your doctor if the problem does not go away or is severe. This medicine may cause dry eyes and blurred vision. If you wear contact lenses, you may feel some discomfort. Lubricating drops may help. See your eyecare professional if the problem does not go away or is severe. You may notice the shells of the tablets in your stool from time to time. This is normal. Avoid extreme heat. This medicine can cause you to sweat less than normal. Your body temperature could increase to dangerous levels, which may lead to heat stroke. What side effects may I notice from receiving this medicine? Side effects that you should report to your doctor or health care professional as soon as possible: -allergic reactions like skin rash, itching or hives, swelling of the face, lips, or  tongue -agitation -breathing problems -confusion -fever -flushing (reddening of the skin) -hallucinations -memory loss -pain or difficulty passing urine -palpitations -unusually weak or tired Side effects that usually do not require medical attention (report to your doctor or health care professional if they continue or are bothersome): -constipation -headache -sexual difficulties (impotence) This list may not describe all possible side effects. Call your doctor for medical advice about side effects. You may report side effects to FDA at 1-800-FDA-1088. Where should I keep my medicine? Keep out of the reach of children. Store at room temperature between 15 and 30 degrees C (59 and 86 degrees F). Protect from moisture and humidity. Throw away any unused medicine after the expiration date. NOTE: This sheet is a summary. It may not cover all possible information. If you have questions about this medicine, talk to your doctor, pharmacist, or health care provider.  2018 Elsevier/Gold Standard (2013-04-19 10:57:06)

## 2017-07-19 NOTE — Progress Notes (Signed)
GYNECOLOGY  VISIT   HPI: 32 y.o.   Single  African American  female   [phone] with No LMP recorded (lmp unknown). (Menstrual status: IUD).   here for uti. Having urinary frequency starting in the last month.  Up at night 2 - 3 times to avoid so disrupting her sleep.  Pain at the end of urination.  No blood in urine.  Right lower back pain.  Some nausea with her headaches.  No vomiting.  No fevers.  Feels chilled in general.  No partner changes.   Does have leakage of urine with cough, sneeze, laugh, and exercise.  Can leak for no reason at all.  Sometimes uses panty liners.  No regular Kegel's.  Drinking lemon water often.   Received an Rx for Ditropan and never took it due to concern about side effects.   States intercourse is painful also.  Negative UC 10/2016, 12/2016.  Negative STD testing 03/25/17.   Urine dip - ph 6, 1+ WBC, + Nitrates.     GYNECOLOGIC HISTORY: No LMP recorded (lmp unknown). (Menstrual status: IUD). Contraception:  Mirena IUD inserted 10/31/13 Menopausal hormone therapy:  n/a Last mammogram:  n/a Last pap smear:   08-15-15 Neg:Neg HR HPV;08-03-13 Neg:Neg HR HPV         OB History    Gravida  3   Para  2   Term  2   Preterm  0   AB  1   Living  2     SAB  0   TAB  1   Ectopic  0   Multiple  0   Live Births  2              Patient Active Problem List   Diagnosis Date Noted  . GERD (gastroesophageal reflux disease) 03/09/2014  . Altered bowel habits 03/09/2014  . Bloating 03/09/2014  . Helicobacter pylori ab+ 03/09/2014  . 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 Menorrhagia 03/29/2013  . Encounter for counseling 03/29/2013  . Abdominal hernia 09/29/2011  . 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 Abdominal hernia   . Abnormal Pap smear of cervix 2005   HPV +  . Anemia   . Anxiety   . Chlamydia 08/18/2015   repeat testing negative October 2017.  Marland Kitchen Condyloma acuminatum of vulva    . Depression    but doesn't require meds  . GERD (gastroesophageal reflux disease)    only takes OTC meds  . History of bronchitis 3+yrs ago  . History of migraine 09/20/11-last one  . HSV-1 (herpes simplex virus 1) infection   . HSV-2 (herpes simplex virus 2) infection   . Hyperlipidemia    postpartum 2+yrs ago  . Hypertension    postpartum 2+yrs ago  . Low vitamin D level 2018  . Sickle cell trait ([place])   . Trichomonas infection 09/2016    Past Surgical History:  Procedure Laterality Date  . ABDOMINOPLASTY  09/29/2011   Procedure: ABDOMINOPLASTY;  Surgeon: Theodoro Kos, DO;  Location: [facility];  Service: Plastics;  Laterality: N/A;  ABDOMINOPLASTY FOR REPAIR OF RECTUS DIASTASIS  . EYE SURGERY  1990's  . OVARIAN CYST REMOVAL  2012  . VENTRAL HERNIA REPAIR  09/29/2011   Procedure: HERNIA REPAIR VENTRAL ADULT;  Surgeon: Joyice Faster. Cornett, MD;  Location: [facility];  Service: General;  Laterality: N/A;    Current Outpatient Medications  Medication Sig Dispense Refill  . acetaminophen (TYLENOL) 500  MG tablet Take 1,000 mg by mouth every 6 (six) hours as needed. For pain    . aspirin-acetaminophen-caffeine (EXCEDRIN MIGRAINE) 250-250-65 MG per tablet Take 1 tablet by mouth daily as needed. For migraines    . B Complex Vitamins (B COMPLEX 100 PO) Take by mouth daily.    . fluticasone (FLONASE) 50 MCG/ACT nasal spray as needed.  0  . levonorgestrel (MIRENA) 20 MCG/24HR IUD 1 each by Intrauterine route once.    . lidocaine (XYLOCAINE) 2 % jelly Apply 1 application topically 3 (three) times daily. 30 mL 0  . metroNIDAZOLE (METROGEL) 0.75 % vaginal gel Place one applicator pv at hs twice a week for 6 months. 70 g 5  . NONFORMULARY OR COMPOUNDED ITEM Boric acid suppositories 600 mg. Place 1 suppository vaginally daily for 21 days. 21 each 0  . omeprazole (PRILOSEC) 40 MG capsule Take 1 capsule by mouth as needed.  0  . ondansetron (ZOFRAN-ODT) 4 MG disintegrating tablet Take 1 tablet by mouth as  needed.  0   No current facility-administered medications for this visit.      ALLERGIES: Other; Percocet [oxycodone-acetaminophen]; and Valtrex [valacyclovir hcl]  Family History  Problem Relation Age of Onset  . Hypertension Father     Social History   Socioeconomic History  . Marital status: Single    Spouse name: Not on file  . Number of children: Not on file  . Years of education: Not on file  . Highest education level: Not on file  Occupational History  . Not on file  Social Needs  . Financial resource strain: Not on file  . Food insecurity:    Worry: Not on file    Inability: Not on file  . Transportation needs:    Medical: Not on file    Non-medical: Not on file  Tobacco Use  . Smoking status: Former Smoker    Types: Cigars  . Smokeless tobacco: Never Used  Substance and Sexual Activity  . Alcohol use: Yes    Alcohol/week: 0.0 oz    Comment: 1-2 drinks per month  . Drug use: No  . Sexual activity: Yes    Partners: Male    Birth control/protection: IUD    Comment: Mirena IUD inserted 10-31-13  Lifestyle  . Physical activity:    Days per week: Not on file    Minutes per session: Not on file  . Stress: Not on file  Relationships  . Social connections:    Talks on phone: Not on file    Gets together: Not on file    Attends religious service: Not on file    Active member of club or organization: Not on file    Attends meetings of clubs or organizations: Not on file    Relationship status: Not on file  . Intimate partner violence:    Fear of current or ex partner: Not on file    Emotionally abused: Not on file    Physically abused: Not on file    Forced sexual activity: Not on file  Other Topics Concern  . Not on file  Social History Narrative  . Not on file    Review of Systems  Constitutional: Negative.   HENT: Negative.   Eyes: Negative.   Respiratory: Negative.   Cardiovascular: Negative.   Gastrointestinal: Negative.   Endocrine:  Negative.   Genitourinary: Positive for frequency and urgency.       Pain with urination Loss of urine spontaneously Loss of urine with  sneeze or cough Night urination  Musculoskeletal: Negative.   Skin: Negative.   Allergic/Immunologic: Negative.   Neurological: Negative.   Hematological: Negative.   Psychiatric/Behavioral: Negative.     PHYSICAL EXAMINATION:    BP 112/62 (BP Location: Right Arm, Patient Position: Sitting, Cuff Size: Normal)   Pulse 68   Temp 98.2 F (36.8 C) (Oral)   Resp 16   Ht 5\' 3"  (1.6 m)   Wt 158 lb (71.7 kg)   LMP  (LMP Unknown)   BMI 27.99 kg/m     General appearance: alert, cooperative and appears stated age  Pelvic: External genitalia:  no lesions              Urethra:  normal appearing urethra with no masses, tenderness or lesions              Bartholins and Skenes: normal                 Vagina: normal appearing vagina with normal color and discharge, no lesions              Cervix: no lesions.  IUD strings present.                Bimanual Exam:  Uterus:  normal size, contour, position, consistency, mobility, non-tender.  Tender to palpation over bladder.              Adnexa: no mass, fullness, tenderness              Chaperone was present for exam.  ASSESSMENT  UTI.  Mixed incontinence.   PLAN  Urine micro and culture.  Bactrim DS po bid x 3 days.  We discussed urinary incontinence and tx with avoidance of bladder irritants, pelvic floor PT, and surgery for stress incontinence.   If she continues to have negative urine cultures, consider urology referral for evaluation of possible interstitial cystitis.  Ditropan XL 5 mg nightly.  #30, RF one.  She will report back how she is doing and will not start until infection is treated.  Side effects discussed.    An After Visit Summary was printed and given to the patient.  __25____ minutes face to face time of which over 50% was spent in counseling.

## 2017-07-20 LAB — URINALYSIS, MICROSCOPIC ONLY
Casts: NONE SEEN /lpf
WBC, UA: >30 /hpf — AB (ref 0–5)

## 2017-07-21 ENCOUNTER — Telehealth: Payer: Self-pay | Admitting: Obstetrics and Gynecology

## 2017-07-21 ENCOUNTER — Encounter: Payer: Self-pay | Admitting: Obstetrics and Gynecology

## 2017-07-21 LAB — URINE CULTURE

## 2017-07-21 MED ORDER — NITROFURANTOIN MONOHYD MACRO 100 MG PO CAPS: 100.0000 mg | ORAL_CAPSULE | Freq: Two times a day (BID) | ORAL | 0 refills | Status: AC

## 2017-07-21 MED ORDER — FLUCONAZOLE 150 MG PO TABS: ORAL_TABLET | ORAL | 0 refills | Status: DC

## 2017-07-21 NOTE — Telephone Encounter (Signed)
Her urine culture is showing E Coli resistant to Bactrim.  She needs to stop the Bactrim.  I recommend she switch to Macrobid 100 mg po bid x 5 days.  Dispense:  10.  RF none. Diflucan 150 mg po x 1.  May repeat in 72 hours.  Dispense:  10.  RF none.

## 2017-07-21 NOTE — Telephone Encounter (Signed)
Patient sent the following correspondence through [facility]. Routing to triage to assist patient with request.  ----- Message from [place], Generic sent at 07/21/2017 10:54 AM EDT -----    Hi Dr. [facility] Simmonds, I would like to know if I could get a prescription for Diflucan. Usually when I take antibiotics I get yeast infections.     Thank you.

## 2017-07-21 NOTE — Telephone Encounter (Signed)
Spoke with patient. Started Bactrim Tuesday afternoon for urinary frequency and pain with urination. Some improvement in symptoms, not resolved.   Patient reports external vaginal irritation and discomfort since starting abx, is requesting RX diflucan.   Patient also requesting urine culture results.   Advised will review with Dr. Quincy Simmonds and return call.   Dr. Quincy Simmonds -please advise on diflucan and review urine culture results.

## 2017-07-21 NOTE — Telephone Encounter (Signed)
Spoke with patient, advised as seen below per Dr. Quincy Simmonds. Rx for Macrobid and diflucan to verified pharmacy on file. Patient verbalizes understanding and is agreeable. Encounter closed.

## 2017-09-01 ENCOUNTER — Other Ambulatory Visit: Payer: Self-pay | Admitting: Obstetrics and Gynecology

## 2017-09-01 NOTE — Telephone Encounter (Signed)
Rx declined without evaluation.  Please let her know that she needs an office visit with me.  She is also overdue for her annual exam so we can discuss it further then.

## 2017-09-01 NOTE — Telephone Encounter (Signed)
Medication refill request:  Boric acid suppositories 600mg   Last AEX:  05/28/16 Next AEX:  None scheduled at th Last MMG (if hormonal medication request): NA Refill authorized: Please advise

## 2017-09-07 NOTE — Telephone Encounter (Signed)
Called and left a message for patient letting her know she need to schedule an appointment.

## 2017-09-23 ENCOUNTER — Other Ambulatory Visit: Payer: Self-pay

## 2017-09-23 ENCOUNTER — Telehealth: Payer: Self-pay | Admitting: Obstetrics and Gynecology

## 2017-09-23 ENCOUNTER — Ambulatory Visit (INDEPENDENT_AMBULATORY_CARE_PROVIDER_SITE_OTHER): Payer: Managed Care, Other (non HMO) | Admitting: Certified Nurse Midwife

## 2017-09-23 ENCOUNTER — Encounter: Payer: Self-pay | Admitting: Certified Nurse Midwife

## 2017-09-23 VITALS — BP 120/78 | HR 68 | Resp 16 | Ht 63.0 in | Wt 162.0 lb

## 2017-09-23 DIAGNOSIS — N898 Other specified noninflammatory disorders of vagina: Secondary | ICD-10-CM | POA: Diagnosis not present

## 2017-09-23 DIAGNOSIS — Z113 Encounter for screening for infections with a predominantly sexual mode of transmission: Secondary | ICD-10-CM

## 2017-09-23 DIAGNOSIS — Z01419 Encounter for gynecological examination (general) (routine) without abnormal findings: Secondary | ICD-10-CM

## 2017-09-23 NOTE — Telephone Encounter (Signed)
Patient is asking to come today for an infection. To triage assist with scheduling with Dr.Silva. Patient states she would see any provider that is available today.

## 2017-09-23 NOTE — Progress Notes (Signed)
32 y.o. Single African American female [phone] here with complaint of vaginal symptoms of itching, burning, and increase discharge. Describes discharge as odorous as she has had with BV, which has been chronic with her. Previous use of Boric Acid with good response at times.  Onset of symptoms for awhile.  Denies new personal products or vaginal dryness. She feels this has been happening since IUD was inserted. No partner change. No STD concerns but desires testing.Marland Kitchen Urinary symptoms none . Contraception is Mirena inserted 10/31/13.  Review of Systems  Constitutional: Negative.   HENT: Negative.   Eyes: Negative.   Respiratory: Negative.   Cardiovascular: Negative.   Gastrointestinal: Negative.   Genitourinary:       Vaginal discharge with odor, pain or bleeding with intercourse  Musculoskeletal: Negative.   Skin: Negative.   Neurological: Negative.   Endo/Heme/Allergies: Negative.   Psychiatric/Behavioral: Negative.     O:Healthy female WDWN Affect: normal, orientation x 3  Exam:Skin: warm and dry Abdomen:soft, non tender, no masses  Inguinal Lymph nodes: no enlargement or tenderness Pelvic exam: External genital: normal female, no lesions or skin change BUS: negative Vagina: copious slightly odorous beige in color discharge noted.  Affirm taken Cervix: normal, non tender, no CMT, IUD string noted in cervix. Uterus: normal, non tender Adnexa:normal, non tender, no masses or fullness noted  A:Normal pelvic exam Contraception MIrena IUD due for removal in 2022, may desire early removal R/O vaginal infection STD screening   P:Discussed findings of normal pelvic exam, with discharge noted.. Discussed Aveeno or baking soda sitz bath for comfort. Avoid moist clothes or pads for extended period of time. If working out in gym clothes   Labs: Affirm, STD panel, Hep C, Gc/Chlamydia Patient may be interested in ablation for cycle control instead of MIrena due to BV history. Will advise  if she desires to move forward.   Rv prn

## 2017-09-23 NOTE — Telephone Encounter (Signed)
Spoke with patient. She reports she feels like she has bacterial vaginosis. Has had this before and thinks this causing her vaginal irritation.  Requests to come in today. Office visit with Melvia Heaps CNM scheduled for evaluation for bacterial vaginosis.  Pt agreeable to plan of care.  Encounter closed.   cc Dr. [facility] Simmonds.

## 2017-09-24 LAB — HEP, RPR, HIV PANEL
HIV Screen 4th Generation wRfx: NONREACTIVE
Hepatitis B Surface Ag: NEGATIVE
RPR Ser Ql: NONREACTIVE

## 2017-09-24 LAB — HEPATITIS C ANTIBODY: Hep C Virus Ab: <0.1 s/co ratio (ref 0.0–0.9)

## 2017-09-25 LAB — VAGINITIS/VAGINOSIS, DNA PROBE
Candida Species: NEGATIVE
Gardnerella vaginalis: POSITIVE — AB
Trichomonas vaginosis: NEGATIVE

## 2017-09-26 ENCOUNTER — Telehealth: Payer: Self-pay | Admitting: Certified Nurse Midwife

## 2017-09-26 MED ORDER — METRONIDAZOLE 500 MG PO TABS: 500.0000 mg | ORAL_TABLET | Freq: Two times a day (BID) | ORAL | 0 refills | Status: DC

## 2017-09-26 NOTE — Telephone Encounter (Signed)
Spoke with patient, advised as seen below per Melvia Heaps, CNM. Rx for flagyl to verified pharmacy. ETOH precautions reviewed. Patient verbalizes understanding and is agreeable. Encounter closed.      Notes recorded by Regina Eck, CNM on 09/26/2017 at 4:41 PM EDT Notify patient that vaginal screen was negative for yeast and trichomonas, but positive for BV Will need Flagyl bid x 7 give alcohol precautions Hep B,C, HIV, RPR are negative Gc/Chlamydia pending

## 2017-09-26 NOTE — Telephone Encounter (Signed)
Patient called to see if her results are ready from her visit on 09/23/17. She said she was told to expect a call today.

## 2017-09-27 LAB — GC/CHLAMYDIA PROBE AMP
Chlamydia trachomatis, NAA: NEGATIVE
Neisseria gonorrhoeae by PCR: NEGATIVE

## 2017-12-26 ENCOUNTER — Telehealth: Payer: Self-pay | Admitting: Obstetrics and Gynecology

## 2017-12-26 NOTE — Telephone Encounter (Signed)
Spoke with patient. Patient requesting Rx for metrogel for recurrent BV. Patient reports vaginal d/c, odor and itching. Last tx with flagyl PO 09/26/17, patient states symptoms never completely resolve, has been using metrogel twice weekly and has run out. Menses "end of October and this threw my vaginal ph off".   Advised will need OV for further evaluation, OV scheduled with Melvia Heaps, CNM on 11/12 at 3:45pm.   Routing to provider for final review. Patient is agreeable to disposition. Will close encounter.  Cc: Dr. Quin[facility] Simmonds

## 2017-12-26 NOTE — Telephone Encounter (Signed)
Patient stated that she is having discharge, odor and itching. Patient stated that symptoms have been going on for 3-4 days.

## 2017-12-27 ENCOUNTER — Other Ambulatory Visit: Payer: Self-pay

## 2017-12-27 ENCOUNTER — Encounter: Payer: Self-pay | Admitting: Certified Nurse Midwife

## 2017-12-27 ENCOUNTER — Ambulatory Visit: Payer: BC Managed Care – PPO | Admitting: Certified Nurse Midwife

## 2017-12-27 VITALS — BP 106/68 | HR 68 | Resp 16 | Wt 155.0 lb

## 2017-12-27 DIAGNOSIS — N898 Other specified noninflammatory disorders of vagina: Secondary | ICD-10-CM

## 2017-12-27 DIAGNOSIS — R102 Pelvic and perineal pain: Secondary | ICD-10-CM | POA: Diagnosis not present

## 2017-12-27 LAB — POCT URINALYSIS DIPSTICK
Bilirubin, UA: NEGATIVE
Blood, UA: NEGATIVE
Glucose, UA: NEGATIVE
Ketones, UA: NEGATIVE
Leukocytes, UA: NEGATIVE
Nitrite, UA: NEGATIVE
Protein, UA: NEGATIVE
Urobilinogen, UA: NEGATIVE E.U./dL — AB
pH, UA: 5 (ref 5.0–8.0)

## 2017-12-27 LAB — POCT URINE PREGNANCY: Preg Test, Ur: NEGATIVE

## 2017-12-27 NOTE — Progress Notes (Signed)
32 y.o. Single African American female [phone] here with complaint of vaginal symptoms of mild itching, burning, and increase odorous discharge. Describes discharge as white and no thickness. . Onset of symptoms 7 days ago. Denies new personal products or vaginal dryness. Tried condoms had burning with use. No STD concerns but would like screening. Urinary symptoms has noted no problems. . Contraception is IUD.  Review of Systems  Constitutional: Negative.   HENT: Negative.   Eyes: Negative.   Respiratory: Negative.   Cardiovascular: Negative.   Gastrointestinal: Negative.   Genitourinary: Negative.        Having vaginal itching and increase in discharge again  Musculoskeletal: Negative.   Skin: Negative.   Neurological: Negative.   Endo/Heme/Allergies: Negative.   Psychiatric/Behavioral: Negative.     O:Healthy female WDWN Affect: normal, orientation x 3  Exam:Skin: warm and dry  Abdomen: soft, non tender  Inguinal Lymph nodes: no enlargement or tenderness Pelvic exam: External genital: normal female BUS: negative Vagina: white thin odorous discharge noted.   , Affirm taken Cervix: normal, non tender, no CMT Uterus: normal, non tender Adnexa:normal, non tender, no masses or fullness noted   Wet Prep results: poct urine-neg, poct upt-neg  A:Normal pelvic exam R/O vaginal infection STD screening   P:Discussed findings of normal pelvic exam and vaginal odor noted on exam with discharge.  Discussed Aveeno or baking soda sitz bath for odor and comfort while awaiting lab results. Patient agreeable. Questions regarding Boric acid use for prevention.  Lab: Affirm, Gc/Chlamydia  Rv prn

## 2017-12-27 NOTE — Addendum Note (Signed)
Addended by: Regina Eck on: 12/27/2017 05:15 PM   Modules accepted: Orders

## 2017-12-28 ENCOUNTER — Other Ambulatory Visit: Payer: Self-pay

## 2017-12-28 LAB — VAGINITIS/VAGINOSIS, DNA PROBE
Candida Species: NEGATIVE
Gardnerella vaginalis: POSITIVE — AB
Trichomonas vaginosis: NEGATIVE

## 2017-12-28 MED ORDER — METRONIDAZOLE 500 MG PO TABS: 500.0000 mg | ORAL_TABLET | Freq: Two times a day (BID) | ORAL | 0 refills | Status: DC

## 2017-12-29 LAB — GC/CHLAMYDIA PROBE AMP
Chlamydia trachomatis, NAA: NEGATIVE
Neisseria gonorrhoeae by PCR: NEGATIVE

## 2018-01-09 ENCOUNTER — Telehealth: Payer: Self-pay | Admitting: Certified Nurse Midwife

## 2018-01-09 NOTE — Telephone Encounter (Signed)
Patient says the medication for BV is not working and would like to speak with nurse about other options.

## 2018-01-09 NOTE — Telephone Encounter (Signed)
Spoke with patient. Patient reports thin vaginal d/c with odor, no color,  vaginal itching and discomfort did not resolve after completing flagyl. Reports no change in symptoms. 12/27/17 vaginitis testing positive for BV, treated with Flagyl bid x7days.   Patient requesting Dr. Quincy Simmonds review for alternative medication.   Advised will review and return call with recommendations. Patient agreeable.   Dr. Quincy Simmonds -please advise.

## 2018-01-09 NOTE — Telephone Encounter (Signed)
I recommend Tindamax 500 mg, take 2 by mouth once daily for 5 days.  Dispense:  10, RF none. ETOH precautions.  Needs office visit if symptoms persist.

## 2018-01-10 MED ORDER — TINIDAZOLE 500 MG PO TABS: 1000.0000 mg | ORAL_TABLET | Freq: Every day | ORAL | 0 refills | Status: AC

## 2018-01-10 NOTE — Telephone Encounter (Signed)
Spoke with patient, advised as seen below per Dr. Silva. Rx to verified pharmacy. Patient verbalizes understanding and is agreeable.   Encounter closed. 

## 2018-01-16 ENCOUNTER — Telehealth: Payer: Self-pay | Admitting: Certified Nurse Midwife

## 2018-01-16 MED ORDER — FLUCONAZOLE 150 MG PO TABS: ORAL_TABLET | ORAL | 0 refills | Status: DC

## 2018-01-16 NOTE — Telephone Encounter (Signed)
Spoke with patient. Patient completed tindamax for BV. Patient is requesting RX for diflucan. Reports vaginal irritation, "feeling raw" and itching. Denies vaginal d/c or odor. Recommended OTC monistat, patient declined. Patient states she usually gets Rx for Diflucan to take at end of treatment for BV.   Advised will review with covering provider, Dr. Quincy Simmonds, and return call. Patient agreeable.   Dr. Quincy Simmonds -please advise on diflucan.  Cc: Melvia Heaps, CNM

## 2018-01-16 NOTE — Telephone Encounter (Signed)
Ok for Diflucan 150 mg po x 1.  May repeat in 72 hours prn.

## 2018-01-16 NOTE — Telephone Encounter (Signed)
Left message to call Jill, RN at [facility] [phone].   

## 2018-01-16 NOTE — Telephone Encounter (Signed)
Patient is having yeast infection symptoms after being on antibiotics. Requesting a prescription for diflucan to [facility]. Pharmacy number [phone].

## 2018-01-16 NOTE — Telephone Encounter (Signed)
Rx for diflucan to [facility] on file.   Call returned to patient, left detailed message, ok per dpr. Advised as seen below per Dr. Quincy Simmonds, Rx to [facility]. Return call to office if any additional questions.  Encounter closed.

## 2018-02-03 ENCOUNTER — Telehealth: Payer: Self-pay | Admitting: Obstetrics and Gynecology

## 2018-02-03 DIAGNOSIS — R102 Pelvic and perineal pain: Secondary | ICD-10-CM

## 2018-02-03 DIAGNOSIS — N898 Other specified noninflammatory disorders of vagina: Secondary | ICD-10-CM

## 2018-02-03 NOTE — Telephone Encounter (Signed)
Spoke with patient. Patient reports completing tindamax and diflucan, BV symptoms have not resolved. Boric acid vaginal suppository works for 1 day. Patient reports foul vaginal odor, urinary frequency, pelvic cramping and pressure. Urinary frequency and urgency. Chills, has not checked temp. Mirena IUD for contraceptive. Has not been SA due to symptoms. Patient states none of her symptoms are not new "have been going on a long time". Patient requesting PUS with OV on 12/26.   Advised to keep OV as scheduled for 12/26, next available [place] appt at [facility] 02/16/18. Patient states she does not want 2 separate OV. Advised I will review with Dr. Quincy Simmonds and return call. Patient is agreeable.   Dr. Quincy Simmonds -please advise on PUS.

## 2018-02-03 NOTE — Telephone Encounter (Signed)
Message left to return call to [place] at [phone].   Mirena IUD contraception. Last seen Melvia Heaps CNM for pelvic pain and bacterial vaginosis 12/27/17.

## 2018-02-03 NOTE — Telephone Encounter (Signed)
Patient would like to speak with Dr Quincy Simmonds about having an ultrasound. She is scheduled 12/26 for recurrent bv symptoms.

## 2018-02-03 NOTE — Telephone Encounter (Signed)
Call reviewed with Dr. Quincy Simmonds, call returned to patient. Left detailed message, ok per dpr. OK to proceed with PUS and consult with Dr. Quincy Simmonds, first available PUS appt 02/16/18. Please return call to office to schedule. Will leave OV as scheduled for 12/26 until call returned to confirm. Advised office phones do go off at 4:30pm, will return on at [place] 12/23.

## 2018-02-06 NOTE — Progress Notes (Deleted)
GYNECOLOGY  VISIT   HPI: 32 y.o.   Single  African American  female   [phone] with No LMP recorded. (Menstrual status: IUD).   here for     GYNECOLOGIC HISTORY: No LMP recorded. (Menstrual status: IUD). Contraception: M[place]a IUD 10-31-13 Menopausal hormone therapy:  n/a Last mammogram: 07-05-14 Lt.Br.US/Neg/BiRads1 Last pap smear:  08-15-15 Neg:Neg HR HPV;08-03-13 Neg:Neg HR HPV         OB History    Gravida  3   Para  2   Term  2   Preterm  0   AB  1   Living  2     SAB  0   TAB  1   Ectopic  0   Multiple  0   Live Births  2              Patient Active Problem List   Diagnosis Date Noted  . GERD (gastroesophageal reflux disease) 03/09/2014  . Altered bowel habits 03/09/2014  . Bloating 03/09/2014  . Helicobacter pylori ab+ 03/09/2014  . 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 Menorrhagia 03/29/2013  . Encounter for counseling 03/29/2013  . Abdominal hernia 09/29/2011  . 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 Abdominal hernia   . Abnormal Pap smear of cervix 2005   HPV +  . Anemia   . Anxiety   . Chlamydia 08/18/2015   repeat testing negative October 2017.  Marland Kitchen Condyloma acuminatum of vulva   . Depression    but doesn't require meds  . GERD (gastroesophageal reflux disease)    only takes OTC meds  . History of bronchitis 3+yrs ago  . History of migraine 09/20/11-last one  . HSV-1 (herpes simplex virus 1) infection   . HSV-2 (herpes simplex virus 2) infection   . Hyperlipidemia    postpartum 2+yrs ago  . Hypertension    postpartum 2+yrs ago  . Low vitamin D level 2018  . Sickle cell trait ([place])   . Trichomonas infection 09/2016    Past Surgical History:  Procedure Laterality Date  . ABDOMINOPLASTY  09/29/2011   Procedure: ABDOMINOPLASTY;  Surgeon: Theodoro Kos, DO;  Location: [facility];  Service: Plastics;  Laterality: N/A;  ABDOMINOPLASTY FOR REPAIR OF RECTUS DIASTASIS  . EYE SURGERY  1990's  .  INTRAUTERINE DEVICE (IUD) INSERTION     10-31-13 mirena iud inserted  . OVARIAN CYST REMOVAL  2012  . VENTRAL HERNIA REPAIR  09/29/2011   Procedure: HERNIA REPAIR VENTRAL ADULT;  Surgeon: Joyice Faster. Cornett, MD;  Location: [facility];  Service: General;  Laterality: N/A;    Current Outpatient Medications  Medication Sig Dispense Refill  . acetaminophen (TYLENOL) 500 MG tablet Take 1,000 mg by mouth every 6 (six) hours as needed. For pain    . aspirin-acetaminophen-caffeine (EXCEDRIN MIGRAINE) 250-250-65 MG per tablet Take 1 tablet by mouth daily as needed. For migraines    . fluconazole (DIFLUCAN) 150 MG tablet Take one tablet now po, may repeat in 72 hours if symptoms still present. 2 tablet 0  . fluticasone (FLONASE) 50 MCG/ACT nasal spray as needed.  0  . levonorgestrel (MIRENA) 20 MCG/24HR IUD 1 each by Intrauterine route once.    . metroNIDAZOLE (FLAGYL) 500 MG tablet Take 1 tablet (500 mg total) by mouth 2 (two) times daily. 14 tablet 0  . NONFORMULARY OR COMPOUNDED ITEM Boric acid suppositories 600 mg. Place 1 suppository vaginally daily for 21 days. (Patient not taking: Reported  on 12/27/2017) 21 each 0  . omeprazole (PRILOSEC) 40 MG capsule Take 1 capsule by mouth as needed.  0  . ondansetron (ZOFRAN-ODT) 4 MG disintegrating tablet Take 1 tablet by mouth as needed.  0  . Vitamin D, Ergocalciferol, (DRISDOL) 50000 units CAPS capsule TK 1 C PO 1 TIME A WK FOR 12 WKS  0   No current facility-administered medications for this visit.      ALLERGIES: Amoxicillin; Other; Percocet [oxycodone-acetaminophen]; and Valtrex [valacyclovir hcl]  Family History  Problem Relation Age of Onset  . Hypertension Father     Social History   Socioeconomic History  . Marital status: Single    Spouse name: Not on file  . Number of children: Not on file  . Years of education: Not on file  . Highest education level: Not on file  Occupational History  . Not on file  Social Needs  . Financial resource  strain: Not on file  . Food insecurity:    Worry: Not on file    Inability: Not on file  . Transportation needs:    Medical: Not on file    Non-medical: Not on file  Tobacco Use  . Smoking status: Former Smoker    Types: Cigars  . Smokeless tobacco: Never Used  Substance and Sexual Activity  . Alcohol use: Not Currently    Alcohol/week: 0.0 standard drinks  . Drug use: No  . Sexual activity: Yes    Partners: Male    Birth control/protection: I.U.D.    Comment: Mirena IUD inserted 10-31-13  Lifestyle  . Physical activity:    Days per week: Not on file    Minutes per session: Not on file  . Stress: Not on file  Relationships  . Social connections:    Talks on phone: Not on file    Gets together: Not on file    Attends religious service: Not on file    Active member of club or organization: Not on file    Attends meetings of clubs or organizations: Not on file    Relationship status: Not on file  . Intimate partner violence:    Fear of current or ex partner: Not on file    Emotionally abused: Not on file    Physically abused: Not on file    Forced sexual activity: Not on file  Other Topics Concern  . Not on file  Social History Narrative  . Not on file    Review of Systems  PHYSICAL EXAMINATION:    There were no vitals taken for this visit.    General appearance: alert, cooperative and appears stated age Head: Normocephalic, without obvious abnormality, atraumatic Neck: no adenopathy, supple, symmetrical, trachea midline and thyroid normal to inspection and palpation Lungs: clear to auscultation bilaterally Breasts: normal appearance, no masses or tenderness, No nipple retraction or dimpling, No nipple discharge or bleeding, No axillary or supraclavicular adenopathy Heart: regular rate and rhythm Abdomen: soft, non-tender, no masses,  no organomegaly Extremities: extremities normal, atraumatic, no cyanosis or edema Skin: Skin color, texture, turgor normal. No  rashes or lesions Lymph nodes: Cervical, supraclavicular, and axillary nodes normal. No abnormal inguinal nodes palpated Neurologic: Grossly normal  Pelvic: External genitalia:  no lesions              Urethra:  normal appearing urethra with no masses, tenderness or lesions              Bartholins and Skenes: normal  Vagina: normal appearing vagina with normal color and discharge, no lesions              Cervix: no lesions                Bimanual Exam:  Uterus:  normal size, contour, position, consistency, mobility, non-tender              Adnexa: no mass, fullness, tenderness              Rectal exam: {yes no:314532}.  Confirms.              Anus:  normal sphincter tone, no lesions  Chaperone was present for exam.  ASSESSMENT     PLAN     An After Visit Summary was printed and given to the patient.  ______ minutes face to face time of which over 50% was spent in counseling.

## 2018-02-09 ENCOUNTER — Ambulatory Visit: Payer: BC Managed Care – PPO | Admitting: Obstetrics and Gynecology

## 2018-02-09 ENCOUNTER — Telehealth: Payer: Self-pay | Admitting: Obstetrics and Gynecology

## 2018-02-09 NOTE — Telephone Encounter (Signed)
Patient returned call. Spoke with patient regarding benefit for scheduled ultrasound. Patient understood information presented. Patient scheduled 02/16/2018 with Dr Quincy Simmonds. Patient aware of appointment date, arrival time and cancellation policy. No further questions. Ok to close

## 2018-02-09 NOTE — Telephone Encounter (Signed)
Patient choice to keep appointment.  If she is having pain, I recommend she keep the office visit for today.

## 2018-02-09 NOTE — Telephone Encounter (Signed)
Called patient again.  Requests to cancel today's appointment.  Ultrasound scheduled for 02/16/17.  Pt agreeable.  Will call back if any concerns prior to appointment.  Encounter closed.

## 2018-02-09 NOTE — Telephone Encounter (Signed)
Call placed to patient convey 2020 benefits for scheduled ultrasound appointment. Left voicemail message requesting a return call

## 2018-02-09 NOTE — Telephone Encounter (Signed)
Patient is ready to schedule her ultrasound appointment.  °

## 2018-02-09 NOTE — Telephone Encounter (Signed)
Message left to return call to [place] at [phone].   Does she want to keep appointment for today as scheduled?

## 2018-02-16 ENCOUNTER — Encounter: Payer: Self-pay | Admitting: Obstetrics and Gynecology

## 2018-02-16 ENCOUNTER — Ambulatory Visit (INDEPENDENT_AMBULATORY_CARE_PROVIDER_SITE_OTHER): Payer: BC Managed Care – PPO | Admitting: Obstetrics and Gynecology

## 2018-02-16 ENCOUNTER — Other Ambulatory Visit: Payer: Self-pay

## 2018-02-16 ENCOUNTER — Ambulatory Visit (INDEPENDENT_AMBULATORY_CARE_PROVIDER_SITE_OTHER): Payer: BC Managed Care – PPO

## 2018-02-16 VITALS — BP 110/68 | HR 80 | Ht 62.0 in | Wt 143.8 lb

## 2018-02-16 DIAGNOSIS — R102 Pelvic and perineal pain: Secondary | ICD-10-CM | POA: Diagnosis not present

## 2018-02-16 DIAGNOSIS — N760 Acute vaginitis: Secondary | ICD-10-CM

## 2018-02-16 DIAGNOSIS — B9689 Other specified bacterial agents as the cause of diseases classified elsewhere: Secondary | ICD-10-CM | POA: Diagnosis not present

## 2018-02-16 DIAGNOSIS — N898 Other specified noninflammatory disorders of vagina: Secondary | ICD-10-CM | POA: Diagnosis not present

## 2018-02-16 MED ORDER — NONFORMULARY OR COMPOUNDED ITEM: 2 refills | Status: DC

## 2018-02-16 MED ORDER — NONFORMULARY OR COMPOUNDED ITEM: 0 refills | Status: DC

## 2018-02-16 NOTE — Progress Notes (Signed)
GYNECOLOGY  VISIT   HPI: 33 y.o.   Single  African American  female   [phone] with Patient's last menstrual period was 01/13/2018 (approximate).   here for pelvic ultrasound.    Having pelvic cramping. Notes that this increases when she has BV, which is occurring often.  States she is treated for BV at least 10 times in the last year.  States the cramping is increasing.   Does not bleed often with her Mirena IUD.  LMP 01/03/18.   Does not remember her prior LMP.  Likes not having a menstruation.   When she has BV she has cramping, odor.   Has done a long term treatment for BV with Metrogel for 6 months.  States that the boric acid works well to treat the BV.  Sex can cause cramping, but not always.   Has irritation with condom use.  States she has swelling.   Had negative GC/CT/trich on 12/27/17.  Positive BV on 12/27/17.  Treated with Flagyl x 7 days and then clindamycin ovules and symptoms did not clear.  Did treatment with boric acid capsules from [facility] for 4 days, and symptoms resolved.   Considering IUD removal and permanent contraception.  IUD expires in Sept. 2020.   In the past has used Ortho Evra, Depo, Ortho Tricyclen, Ortho Lo, Zovia, Seasonique. Depo causes prolonged bleeding.   Taking probiotics.   GYNECOLOGIC HISTORY: Patient's last menstrual period was 01/13/2018 (approximate). Contraception: Mirena IUD 10-31-13 Menopausal hormone therapy:  n/a Last mammogram:  n/a Last pap smear:  08-15-15 Neg:Neg HR HPV;08-03-13 Neg:Neg HR HPV         OB History    Gravida  3   Para  2   Term  2   Preterm  0   AB  1   Living  2     SAB  0   TAB  1   Ectopic  0   Multiple  0   Live Births  2              Patient Active Problem List   Diagnosis Date Noted  . GERD (gastroesophageal reflux disease) 03/09/2014  . Altered bowel habits 03/09/2014  . Bloating 03/09/2014  . Helicobacter pylori ab+ 03/09/2014  . Condyloma acuminatum of vulva  11/21/2013  . Menorrhagia 03/29/2013  . Encounter for counseling 03/29/2013  . Abdominal hernia 09/29/2011  . 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 Abdominal hernia   . Abnormal Pap smear of cervix 2005   HPV +  . Anemia   . Anxiety   . Chlamydia 08/18/2015   repeat testing negative October 2017.  Marland Kitchen Condyloma acuminatum of vulva   . Depression    but doesn't require meds  . GERD (gastroesophageal reflux disease)    only takes OTC meds  . History of bronchitis 3+yrs ago  . History of migraine 09/20/11-last one  . HSV-1 (herpes simplex virus 1) infection   . HSV-2 (herpes simplex virus 2) infection   . Hyperlipidemia    postpartum 2+yrs ago  . Hypertension    postpartum 2+yrs ago  . Low vitamin D level 2018  . Sickle cell trait ([place])   . Trichomonas infection 09/2016    Past Surgical History:  Procedure Laterality Date  . ABDOMINOPLASTY  09/29/2011   Procedure: ABDOMINOPLASTY;  Surgeon: Theodoro Kos, DO;  Location: [facility];  Service: Plastics;  Laterality: N/A;  ABDOMINOPLASTY FOR REPAIR OF RECTUS DIASTASIS  .  EYE SURGERY  1990's  . INTRAUTERINE DEVICE (IUD) INSERTION     10-31-13 mirena iud inserted  . OVARIAN CYST REMOVAL  2012  . VENTRAL HERNIA REPAIR  09/29/2011   Procedure: HERNIA REPAIR VENTRAL ADULT;  Surgeon: Joyice Faster. Cornett, MD;  Location: [facility];  Service: General;  Laterality: N/A;    Current Outpatient Medications  Medication Sig Dispense Refill  . acetaminophen (TYLENOL) 500 MG tablet Take 1,000 mg by mouth every 6 (six) hours as needed. For pain    . aspirin-acetaminophen-caffeine (EXCEDRIN MIGRAINE) 250-250-65 MG per tablet Take 1 tablet by mouth daily as needed. For migraines    . fluticasone (FLONASE) 50 MCG/ACT nasal spray as needed.  0  . levonorgestrel (MIRENA) 20 MCG/24HR IUD 1 each by Intrauterine route once.    . NONFORMULARY OR COMPOUNDED ITEM Boric acid suppositories 600 mg. Place 1 suppository vaginally daily for  21 days. 21 each 0  . omeprazole (PRILOSEC) 40 MG capsule Take 1 capsule by mouth as needed.  0  . ondansetron (ZOFRAN-ODT) 4 MG disintegrating tablet Take 1 tablet by mouth as needed.  0  . Vitamin D, Ergocalciferol, (DRISDOL) 50000 units CAPS capsule TK 1 C PO 1 TIME A WK FOR 12 WKS  0   No current facility-administered medications for this visit.      ALLERGIES: Amoxicillin; Other; Percocet [oxycodone-acetaminophen]; and Valtrex [valacyclovir hcl]  Family History  Problem Relation Age of Onset  . Hypertension Father     Social History   Socioeconomic History  . Marital status: Single    Spouse name: Not on file  . Number of children: Not on file  . Years of education: Not on file  . Highest education level: Not on file  Occupational History  . Not on file  Social Needs  . Financial resource strain: Not on file  . Food insecurity:    Worry: Not on file    Inability: Not on file  . Transportation needs:    Medical: Not on file    Non-medical: Not on file  Tobacco Use  . Smoking status: Former Smoker    Types: Cigars  . Smokeless tobacco: Never Used  Substance and Sexual Activity  . Alcohol use: Not Currently    Alcohol/week: 0.0 standard drinks  . Drug use: No  . Sexual activity: Yes    Partners: Male    Birth control/protection: I.U.D.    Comment: Mirena IUD inserted 10-31-13  Lifestyle  . Physical activity:    Days per week: Not on file    Minutes per session: Not on file  . Stress: Not on file  Relationships  . Social connections:    Talks on phone: Not on file    Gets together: Not on file    Attends religious service: Not on file    Active member of club or organization: Not on file    Attends meetings of clubs or organizations: Not on file    Relationship status: Not on file  . Intimate partner violence:    Fear of current or ex partner: Not on file    Emotionally abused: Not on file    Physically abused: Not on file    Forced sexual activity: Not  on file  Other Topics Concern  . Not on file  Social History Narrative  . Not on file    Review of Systems  All other systems reviewed and are negative.   PHYSICAL EXAMINATION:    BP 110/68 (BP Location:  Right Arm, Patient Position: Sitting, Cuff Size: Normal)   Pulse 80   Ht 5\' 2"  (1.575 m)   Wt 143 lb 12.8 oz (65.2 kg)   LMP 01/13/2018 (Approximate)   BMI 26.30 kg/m      Pelvic US Uterus with no masses.  EMS 3.39 mm.  IUD in normal position.  Small left CL.  Right ovary normal.  Scan free fluid.    ASSESSMENT  Mirena IUD patient.  Pelvic cramping with BV.  Recurrent BV.  Irritation with condom use.   PLAN  Reassurance regarding pelvic US findings.  We discussed endometriosis as a potential cause for pelvic pain.  We discussed permanent contraception versus reversible contraception.  She will continue with Mirena.  Try condom use without spermicide.  She will do empiric tx with boric acid 600 mg vaginal suppository weekly.  Dispense: 4, RF 2.  She knows that this is empiric treatment.  Return for annual exam in March, 2020.    An After Visit Summary was printed and given to the patient.  __25____ minutes face to face time of which over 50% was spent in counseling.

## 2018-02-27 ENCOUNTER — Encounter: Payer: Self-pay | Admitting: Obstetrics and Gynecology

## 2018-02-27 ENCOUNTER — Telehealth: Payer: Self-pay | Admitting: Obstetrics and Gynecology

## 2018-02-27 NOTE — Telephone Encounter (Signed)
Message   Good morning. After doing some research on treatments for BV, I am wondering what your insight is on the following medications. The first is Solosec(secnidazole) and the other is Cervugid Ovules. I have read that both have low reoccurrence rate for BV and both help directly with helping produce the healthy bacteria needed to fight infection. Im pretty desperate right now as the boric acid is only temporarily relieving the symptoms. Thank you.

## 2018-02-27 NOTE — Telephone Encounter (Signed)
Routing to [place]. Please see MyChart message sent to patient below.

## 2018-02-27 NOTE — Telephone Encounter (Signed)
Patient called and spoke with [facility] appointment scheduled for 03/02/2018 at 3:30 pm with Dr.Silva. Encounter closed.

## 2018-02-27 NOTE — Telephone Encounter (Signed)
I agree with an appointment with me.  Patient wanted to do an empiric treatment with boric acid weekly as it was working for her.  Please have her come in for evaluation and we can discuss some of the other treatment choices.

## 2018-03-02 ENCOUNTER — Encounter: Payer: Self-pay | Admitting: Obstetrics and Gynecology

## 2018-03-02 ENCOUNTER — Ambulatory Visit (INDEPENDENT_AMBULATORY_CARE_PROVIDER_SITE_OTHER): Payer: BC Managed Care – PPO | Admitting: Obstetrics and Gynecology

## 2018-03-02 ENCOUNTER — Other Ambulatory Visit: Payer: Self-pay

## 2018-03-02 VITALS — BP 124/82 | HR 80 | Wt 141.6 lb

## 2018-03-02 DIAGNOSIS — N761 Subacute and chronic vaginitis: Secondary | ICD-10-CM

## 2018-03-02 NOTE — Progress Notes (Signed)
GYNECOLOGY  VISIT   HPI: 33 y.o.   Single  African American  female   [phone] with No LMP recorded. (Menstrual status: IUD).   here for medication f/u. Reports having ongoing vaginal discharge with using boric acid.   Some odor and burning.   Used Flagyl and Clindamycin for BV, and her symptoms persisted.  Asking about alternative treatments.   Her last vaginitis testing ws 12/27/17 at which time BV was confirmed.  GYNECOLOGIC HISTORY: No LMP recorded. (Menstrual status: IUD). Contraception:  Mirena IUD 10/31/13 Menopausal hormone therapy:  n/a Last mammogram:  n/a Last pap smear:   08/15/15 Neg:Neg HR HPV; 08/03/13 Neg:Neg HR HPV        OB History    Gravida  3   Para  2   Term  2   Preterm  0   AB  1   Living  2     SAB  0   TAB  1   Ectopic  0   Multiple  0   Live Births  2              Patient Active Problem List   Diagnosis Date Noted  . GERD (gastroesophageal reflux disease) 03/09/2014  . Altered bowel habits 03/09/2014  . Bloating 03/09/2014  . Helicobacter pylori ab+ 03/09/2014  . 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 Menorrhagia 03/29/2013  . Encounter for counseling 03/29/2013  . Abdominal hernia 09/29/2011  . 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 Abdominal hernia   . Abnormal Pap smear of cervix 2005   HPV +  . Anemia   . Anxiety   . Chlamydia 08/18/2015   repeat testing negative October 2017.  Marland Kitchen Condyloma acuminatum of vulva   . Depression    but doesn't require meds  . GERD (gastroesophageal reflux disease)    only takes OTC meds  . History of bronchitis 3+yrs ago  . History of migraine 09/20/11-last one  . HSV-1 (herpes simplex virus 1) infection   . HSV-2 (herpes simplex virus 2) infection   . Hyperlipidemia    postpartum 2+yrs ago  . Hypertension    postpartum 2+yrs ago  . Low vitamin D level 2018  . Sickle cell trait ([place])   . Trichomonas infection 09/2016    Past Surgical  History:  Procedure Laterality Date  . ABDOMINOPLASTY  09/29/2011   Procedure: ABDOMINOPLASTY;  Surgeon: Theodoro Kos, DO;  Location: [facility];  Service: Plastics;  Laterality: N/A;  ABDOMINOPLASTY FOR REPAIR OF RECTUS DIASTASIS  . EYE SURGERY  1990's  . INTRAUTERINE DEVICE (IUD) INSERTION     10-31-13 mirena iud inserted  . OVARIAN CYST REMOVAL  2012  . VENTRAL HERNIA REPAIR  09/29/2011   Procedure: HERNIA REPAIR VENTRAL ADULT;  Surgeon: Joyice Faster. Cornett, MD;  Location: [facility];  Service: General;  Laterality: N/A;    Current Outpatient Medications  Medication Sig Dispense Refill  . acetaminophen (TYLENOL) 500 MG tablet Take 1,000 mg by mouth every 6 (six) hours as needed. For pain    . aspirin-acetaminophen-caffeine (EXCEDRIN MIGRAINE) 250-250-65 MG per tablet Take 1 tablet by mouth daily as needed. For migraines    . fluticasone (FLONASE) 50 MCG/ACT nasal spray as needed.  0  . levonorgestrel (MIRENA) 20 MCG/24HR IUD 1 each by Intrauterine route once.    . NONFORMULARY OR COMPOUNDED ITEM Boric acid suppositories 600 mg. Place 1 suppository vaginally every week.  Dispense:  4, RF  2. 4 each 2  . omeprazole (PRILOSEC) 40 MG capsule Take 1 capsule by mouth as needed.  0  . ondansetron (ZOFRAN-ODT) 4 MG disintegrating tablet Take 1 tablet by mouth as needed.  0  . Vitamin D, Ergocalciferol, (DRISDOL) 50000 units CAPS capsule TK 1 C PO 1 TIME A WK FOR 12 WKS  0   No current facility-administered medications for this visit.      ALLERGIES: Amoxicillin; Other; Percocet [oxycodone-acetaminophen]; and Valtrex [valacyclovir hcl]  Family History  Problem Relation Age of Onset  . Hypertension Father     Social History   Socioeconomic History  . Marital status: Single    Spouse name: Not on file  . Number of children: Not on file  . Years of education: Not on file  . Highest education level: Not on file  Occupational History  . Not on file  Social Needs  . Financial resource strain: Not on  file  . Food insecurity:    Worry: Not on file    Inability: Not on file  . Transportation needs:    Medical: Not on file    Non-medical: Not on file  Tobacco Use  . Smoking status: Former Smoker    Types: Cigars  . Smokeless tobacco: Never Used  Substance and Sexual Activity  . Alcohol use: Not Currently    Alcohol/week: 0.0 standard drinks  . Drug use: No  . Sexual activity: Yes    Partners: Male    Birth control/protection: I.U.D.    Comment: Mirena IUD inserted 10-31-13  Lifestyle  . Physical activity:    Days per week: Not on file    Minutes per session: Not on file  . Stress: Not on file  Relationships  . Social connections:    Talks on phone: Not on file    Gets together: Not on file    Attends religious service: Not on file    Active member of club or organization: Not on file    Attends meetings of clubs or organizations: Not on file    Relationship status: Not on file  . Intimate partner violence:    Fear of current or ex partner: Not on file    Emotionally abused: Not on file    Physically abused: Not on file    Forced sexual activity: Not on file  Other Topics Concern  . Not on file  Social History Narrative  . Not on file    Review of Systems  Constitutional: Negative.   HENT: Negative.   Eyes: Negative.   Respiratory: Negative.   Cardiovascular: Negative.   Gastrointestinal: Negative.   Endocrine: Negative.   Genitourinary: Positive for vaginal discharge.  Musculoskeletal: Negative.   Skin: Negative.   Allergic/Immunologic: Negative.   Neurological: Negative.   Hematological: Negative.   Psychiatric/Behavioral: Negative.     PHYSICAL EXAMINATION:    BP 124/82 (BP Location: Right Arm, Patient Position: Sitting, Cuff Size: Normal)   Pulse 80   Wt 141 lb 9.6 oz (64.2 kg)   BMI 25.90 kg/m     General appearance: alert, cooperative and appears stated age   Pelvic: External genitalia:  no lesions              Urethra:  normal appearing  urethra with no masses, tenderness or lesions              Bartholins and Skenes: normal  Vagina: normal appearing vagina with normal color and discharge, no lesions              Cervix: no lesions.  IUD strings noted.                 Bimanual Exam:  Uterus:  normal size, contour, position, consistency, mobility, non-tender              Adnexa: no mass, fullness, tenderness           Chaperone was present for exam.  ASSESSMENT  Recurrent BV.  Mirena IUD.    PLAN  Will do Nuswab testing.  We reviewed Up to Date and recommended and nonbeneficial tx for recurrent BV.  Will treat acute BV if confirmed, do retesting 48 hours after last abx dosage, and then start suppression regimen for 6 months.    An After Visit Summary was printed and given to the patient.  ___15___ minutes face to face time of which over 50% was spent in counseling.

## 2018-03-04 LAB — NUSWAB VAGINITIS (VG)
Candida albicans, NAA: NEGATIVE
Candida glabrata, NAA: NEGATIVE
Trich vag by NAA: NEGATIVE

## 2018-03-06 ENCOUNTER — Telehealth: Payer: Self-pay

## 2018-03-06 MED ORDER — METRONIDAZOLE 0.75 % VA GEL: VAGINAL | 6 refills | Status: DC

## 2018-03-06 NOTE — Telephone Encounter (Signed)
Left message to call Kaitlyn at [phone]. 

## 2018-03-06 NOTE — Telephone Encounter (Signed)
I agree with taking the probiotic.  It may be helpful and is not harmful.

## 2018-03-06 NOTE — Telephone Encounter (Signed)
Spoke with patient. Advised of message as seen below from [place]. Patient verbalizes understanding. Rx for Metrogel pv at hs twice weekly for 6 months sent to pharmacy on file. Patient would like to know if there are any supplements that she can take to help prevent ph imbalance that may cause a yeast infection from using metrogel so often. Advised may take a probiotic daily to help with this. Advised will review with Dr.Silva as well and return call.

## 2018-03-06 NOTE — Telephone Encounter (Signed)
-----   Message from Nunzio Cobbs, MD sent at 03/05/2018  4:50 PM EST ----- Please inform patient that her testing is negative for bacterial vaginosis, yeast, and trichomonas.  I recommend she start treating with Metrogel pv at hs twice weekly for 6 months.  She does not need to complete another therapy for BV prior to starting the Metrogel.

## 2018-03-07 NOTE — Telephone Encounter (Signed)
Spoke with patient. Advised of message as seen below from Dr.Silva. Patient verbalizes understanding. Encounter closed. 

## 2018-05-11 ENCOUNTER — Telehealth: Payer: Self-pay | Admitting: Obstetrics and Gynecology

## 2018-05-11 NOTE — Telephone Encounter (Signed)
Spoke with patient. Patient states that she is currently having symptoms of BV and is really uncomfortable. Cannot afford to pick up Metrogel due to Covid 19 crisis. Asking for 1 time treatment with Flagyl and then will return to using Metrogel when things normalize with work after Covid 19 crisis.

## 2018-05-11 NOTE — Telephone Encounter (Signed)
Patient is on Metrogel twice weekly for 6 months for ongoing BV. Patient is unable to afford Metrogel at this time and is requesting Flagyl as an alternative.  Routing to [place] for review and advise.

## 2018-05-11 NOTE — Telephone Encounter (Signed)
Flagyl is not used for prophylaxis for recurrent bacterial vaginosis.  She may choose to not do this treatment at this time and treat only for acute infections.

## 2018-05-11 NOTE — Telephone Encounter (Signed)
Patient requesting the pill form of Metrogel instead of gel to be sent to the pharmacy below. She said she is currently not working and can afford the gel.  [facility]  [place]/[address]

## 2018-05-12 MED ORDER — METRONIDAZOLE 500 MG PO TABS: 500.0000 mg | ORAL_TABLET | Freq: Two times a day (BID) | ORAL | 0 refills | Status: DC

## 2018-05-12 NOTE — Telephone Encounter (Signed)
Spoke with patient, advised as seen below per Dr. Quincy Simmonds. Rx for Flagyl to verified pharmacy. Patient verbalizes understanding and is agreeable.   Encounter closed.

## 2018-05-12 NOTE — Telephone Encounter (Signed)
Ok for Flagyl 500 mg po bid x 7 days.  No refills. If symptoms persist, she will need an office visit.

## 2018-05-25 ENCOUNTER — Telehealth: Payer: Self-pay | Admitting: Obstetrics and Gynecology

## 2018-05-25 NOTE — Telephone Encounter (Signed)
I recommend an office visit with me if she would like to have additional treatment for her bleeding.  She has not had an annual exam in 2 years.  She has been seen for problems visits during this time.

## 2018-05-25 NOTE — Telephone Encounter (Signed)
Spoke with patient. Patient has a Mirena IUD. Has been on suppression Metrogel 1 applicator twice weekly since January. Due to COVID 19 is unable to afford Metrogel. Was given 1 time rx of Flagyl 500 mg po BID x 7 days by Dr.Silva on 05/11/2018. Patient completed medication and symptoms resolved. States she has been having irregular spotting with her IUD for the last few months. Notices after having spotting that she has a vaginal odor. Feels spotting is causing her PH to change monthly. Declines OV at this time. Wants to know if OCP or patch can be prescribed temporarily to control irregular spotting to see if this will resolve recurring vaginal odor.

## 2018-05-25 NOTE — Telephone Encounter (Signed)
Spoke with patient. OV scheduled for 05/26/2018 at 10:30 am with Dr.Silva. Patient is agreeable to date and time. Encounter closed.

## 2018-05-25 NOTE — Telephone Encounter (Signed)
Since this is Dr Elza Rafter patient, I will defer to her judgement. Will forward chart to her.

## 2018-05-25 NOTE — Telephone Encounter (Signed)
Patient is wondering if Dr Quincy Simmonds can prescribe her a birth control patch along with her iud because she is having spotting and discharge and her ph seems off.

## 2018-05-26 ENCOUNTER — Encounter: Payer: Self-pay | Admitting: Obstetrics and Gynecology

## 2018-05-26 ENCOUNTER — Other Ambulatory Visit (HOSPITAL_COMMUNITY)
Admission: RE | Admit: 2018-05-26 | Discharge: 2018-05-26 | Disposition: A | Discharge status: Home / Self Care | Payer: BC Managed Care – PPO | Source: Ambulatory Visit | Attending: Obstetrics and Gynecology | Admitting: Obstetrics and Gynecology

## 2018-05-26 ENCOUNTER — Ambulatory Visit: Payer: BC Managed Care – PPO | Admitting: Obstetrics and Gynecology

## 2018-05-26 ENCOUNTER — Other Ambulatory Visit: Payer: Self-pay

## 2018-05-26 VITALS — BP 114/64 | HR 68 | Temp 97.8°F | Resp 16 | Ht 62.25 in | Wt 140.0 lb

## 2018-05-26 DIAGNOSIS — R6889 Other general symptoms and signs: Secondary | ICD-10-CM

## 2018-05-26 DIAGNOSIS — R87810 Cervical high risk human papillomavirus (HPV) DNA test positive: Secondary | ICD-10-CM | POA: Diagnosis not present

## 2018-05-26 DIAGNOSIS — Z01419 Encounter for gynecological examination (general) (routine) without abnormal findings: Secondary | ICD-10-CM

## 2018-05-26 DIAGNOSIS — Z1151 Encounter for screening for human papillomavirus (HPV): Secondary | ICD-10-CM | POA: Insufficient documentation

## 2018-05-26 DIAGNOSIS — Z3202 Encounter for pregnancy test, result negative: Secondary | ICD-10-CM

## 2018-05-26 DIAGNOSIS — Z113 Encounter for screening for infections with a predominantly sexual mode of transmission: Secondary | ICD-10-CM

## 2018-05-26 DIAGNOSIS — N926 Irregular menstruation, unspecified: Secondary | ICD-10-CM

## 2018-05-26 DIAGNOSIS — R87612 Low grade squamous intraepithelial lesion on cytologic smear of cervix (LGSIL): Secondary | ICD-10-CM | POA: Diagnosis not present

## 2018-05-26 DIAGNOSIS — Z01411 Encounter for gynecological examination (general) (routine) with abnormal findings: Secondary | ICD-10-CM | POA: Diagnosis not present

## 2018-05-26 LAB — POCT URINE PREGNANCY: Preg Test, Ur: NEGATIVE

## 2018-05-26 MED ORDER — NORETHINDRONE 0.35 MG PO TABS: 1.0000 | ORAL_TABLET | Freq: Every day | ORAL | 1 refills | Status: DC

## 2018-05-26 NOTE — Patient Instructions (Signed)

## 2018-05-26 NOTE — Progress Notes (Signed)
33 y.o. [id number] Single African American female here for annual exam.    Mirena IUD due for exchange in September.  States the strings bother her partner once a month with intercourse.  Having vaginal bleeding starting November or December Thinks it is a period and is occurring monthly.  Bleeds for 8 - 10 days. It is light and not red other for a day.  With the bleeding, she has increased odor and itching.  Has cramping with bleeding.   Steady partner for the last year.  She wants STD testing.   She wants to add OrthoEvra or OCP to this to avoid the bleeding and odor.  Took OCPs in the past.  She uses Mirena as she is not consistent with pill use.   Not currently taking Flagyl that was prescribed.  Denies odor.   Has migraine with aura. Auditory.   Has recurrent BV and has done tx with Flagyl and Clindamycin.  She was doing a course of Metrogel for prevention but stopped due to cost.   UPT:    Urine dip - negative, pH 5.0.    PCP:   Marda Stalker, PA-C  No LMP recorded. (Menstrual status: IUD).           Sexually active: Yes.    The current method of family planning is IUD.   Mirena inserted 10/31/13. Exercising: Yes.    walking Smoker:  no  Health Maintenance: Pap: 08-15-15  neg HPV HR neg History of abnormal Pap:  Yes 2005 abnormal pap but no treatment to cervix MMG:  07-05-14 left breast u/s neg Colonoscopy:  none BMD:   none  Result  n/a TDaP:  2012 Gardasil:   no HIV: 09/2017 neg Hep C: 09/2017 neg Screening Labs:  ----   reports that she has quit smoking. Her smoking use included cigars. She has never used smokeless tobacco. She reports previous alcohol use. She reports that she does not use drugs.  Past Medical History:  Diagnosis Date  . Abdominal hernia   . Abnormal Pap smear of cervix 2005   HPV +  . Anemia   . Anxiety   . Chlamydia 08/18/2015   repeat testing negative October 2017.  Marland Kitchen Condyloma acuminatum of vulva   . Depression    but doesn't  require meds  . GERD (gastroesophageal reflux disease)    only takes OTC meds  . History of bronchitis 3+yrs ago  . History of migraine 09/20/11-last one  . HSV-1 (herpes simplex virus 1) infection   . HSV-2 (herpes simplex virus 2) infection   . Hyperlipidemia    postpartum 2+yrs ago  . Hypertension    postpartum 2+yrs ago  . Low vitamin D level 2018  . Migraine    with aura  . Sickle cell trait ([place])   . Trichomonas infection 09/2016    Past Surgical History:  Procedure Laterality Date  . ABDOMINOPLASTY  09/29/2011   Procedure: ABDOMINOPLASTY;  Surgeon: Theodoro Kos, DO;  Location: [facility];  Service: Plastics;  Laterality: N/A;  ABDOMINOPLASTY FOR REPAIR OF RECTUS DIASTASIS  . EYE SURGERY  1990's  . INTRAUTERINE DEVICE (IUD) INSERTION     10-31-13 mirena iud inserted  . OVARIAN CYST REMOVAL  2012  . VENTRAL HERNIA REPAIR  09/29/2011   Procedure: HERNIA REPAIR VENTRAL ADULT;  Surgeon: Joyice Faster. Cornett, MD;  Location: [facility];  Service: General;  Laterality: N/A;    Current Outpatient Medications  Medication Sig Dispense Refill  . acetaminophen (TYLENOL) 500  MG tablet Take 1,000 mg by mouth every 6 (six) hours as needed. For pain    . aspirin-acetaminophen-caffeine (EXCEDRIN MIGRAINE) 250-250-65 MG per tablet Take 1 tablet by mouth daily as needed. For migraines    . fluticasone (FLONASE) 50 MCG/ACT nasal spray as needed.  0  . levonorgestrel (MIRENA) 20 MCG/24HR IUD 1 each by Intrauterine route once.    . NONFORMULARY OR COMPOUNDED ITEM Boric acid suppositories 600 mg. Place 1 suppository vaginally every week.  Dispense:  4, RF 2. 4 each 2  . omeprazole (PRILOSEC) 40 MG capsule Take 1 capsule by mouth as needed.  0  . ondansetron (ZOFRAN-ODT) 4 MG disintegrating tablet Take 1 tablet by mouth as needed.  0  . metroNIDAZOLE (METROGEL) 0.75 % vaginal gel Place applicator pv at hs twice weekly for 6 months. (Patient not taking: Reported on 05/26/2018) 70 g 6   No current  facility-administered medications for this visit.     Family History  Problem Relation Age of Onset  . Hypertension Father     Review of Systems  Constitutional: Negative.   HENT: Negative.   Eyes: Negative.   Respiratory: Negative.   Cardiovascular: Negative.   Gastrointestinal: Negative.   Endocrine: Negative.   Genitourinary: Positive for vaginal bleeding and vaginal discharge.       Vaginal itching & irritation  Musculoskeletal: Negative.   Skin: Negative.   Allergic/Immunologic: Negative.   Neurological: Negative.   Hematological: Negative.   Psychiatric/Behavioral: Negative.   She states cold intolerance and wants evaluation for this.  Hx anemia.   Exam:   BP 114/64   Pulse 68   Temp 97.8 F (36.6 C) (Oral)   Resp 16   Ht 5' 2.25" (1.581 m)   Wt 140 lb (63.5 kg)   BMI 25.40 kg/m     General appearance: alert, cooperative and appears stated age Head: Normocephalic, without obvious abnormality, atraumatic Neck: no adenopathy, supple, symmetrical, trachea midline and thyroid normal to inspection and palpation Lungs: clear to auscultation bilaterally Breasts: normal appearance, no masses or tenderness, No nipple retraction or dimpling, No nipple discharge or bleeding, No axillary or supraclavicular adenopathy Heart: regular rate and rhythm Abdomen: soft, non-tender; no masses, no organomegaly Extremities: extremities normal, atraumatic, no cyanosis or edema Skin: Skin color, texture, turgor normal. No rashes or lesions Lymph nodes: Cervical, supraclavicular, and axillary nodes normal. No abnormal inguinal nodes palpated Neurologic: Grossly normal  Pelvic: External genitalia:  no lesions              Urethra:  normal appearing urethra with no masses, tenderness or lesions              Bartholins and Skenes: normal                 Vagina: normal appearing vagina with normal color and discharge, no lesions              Cervix: no lesions.  IUD strings present and  trimmed to the level of the external os.               Pap taken: Yes.   Bimanual Exam:  Uterus:  normal size, contour, position, consistency, mobility, non-tender              Adnexa: no mass, fullness, tenderness            Chaperone was present for exam.  Assessment:   Well woman visit with normal exam. Mirena IUD.  Strings trimmed.  Recurrent BV, increased symptoms with menses.  Migraine with aura.  Plan: Mammogram screening age 58.  Recommended self breast awareness. Pap and HR HPV as above. Guidelines for Calcium, Vitamin D, regular exercise program including cardiovascular and weight bearing exercise. Will try Camilla. 3 packs and 1 refill.  Instructed in use.  STD screening.  TSH, iron, ferritin, CBC.  Declines other routine labs.  Discussed Gardasil vaccine.  We can exchange her IUD early if she would like to do it this summer.  Follow up annually and prn.   After visit summary provided.

## 2018-05-27 LAB — CBC
Hematocrit: 42.2 % (ref 34.0–46.6)
Hemoglobin: 13.9 g/dL (ref 11.1–15.9)
MCH: 29.3 pg (ref 26.6–33.0)
MCHC: 32.9 g/dL (ref 31.5–35.7)
MCV: 89 fL (ref 79–97)
Platelets: 347 10*3/uL (ref 150–450)
RBC: 4.74 x10E6/uL (ref 3.77–5.28)
RDW: 13.3 % (ref 11.7–15.4)
WBC: 5.3 10*3/uL (ref 3.4–10.8)

## 2018-05-27 LAB — FERRITIN: Ferritin: 116 ng/mL (ref 15–150)

## 2018-05-27 LAB — TSH: TSH: 1.21 u[IU]/mL (ref 0.450–4.500)

## 2018-05-27 LAB — IRON: Iron: 89 ug/dL (ref 27–159)

## 2018-05-31 LAB — CYTOLOGY - PAP
Chlamydia: NEGATIVE
HPV: DETECTED — AB
Neisseria Gonorrhea: NEGATIVE

## 2018-06-02 ENCOUNTER — Ambulatory Visit (INDEPENDENT_AMBULATORY_CARE_PROVIDER_SITE_OTHER): Payer: BC Managed Care – PPO | Admitting: Obstetrics and Gynecology

## 2018-06-02 ENCOUNTER — Other Ambulatory Visit: Payer: Self-pay

## 2018-06-02 ENCOUNTER — Encounter: Payer: Self-pay | Admitting: Obstetrics and Gynecology

## 2018-06-02 ENCOUNTER — Telehealth: Payer: Self-pay | Admitting: Obstetrics and Gynecology

## 2018-06-02 VITALS — BP 118/80 | HR 80 | Temp 98.4°F | Ht 62.25 in | Wt 143.0 lb

## 2018-06-02 DIAGNOSIS — R350 Frequency of micturition: Secondary | ICD-10-CM | POA: Diagnosis not present

## 2018-06-02 DIAGNOSIS — R3915 Urgency of urination: Secondary | ICD-10-CM

## 2018-06-02 DIAGNOSIS — N760 Acute vaginitis: Secondary | ICD-10-CM

## 2018-06-02 DIAGNOSIS — R1030 Lower abdominal pain, unspecified: Secondary | ICD-10-CM | POA: Diagnosis not present

## 2018-06-02 LAB — POCT URINALYSIS DIPSTICK
Bilirubin, UA: NEGATIVE
Blood, UA: NEGATIVE
Glucose, UA: NEGATIVE
Ketones, UA: NEGATIVE
Nitrite, UA: NEGATIVE
Protein, UA: NEGATIVE
Urobilinogen, UA: 0.2 E.U./dL
pH, UA: 5 (ref 5.0–8.0)

## 2018-06-02 MED ORDER — BETAMETHASONE VALERATE 0.1 % EX OINT: 1.0000 "application " | TOPICAL_OINTMENT | Freq: Two times a day (BID) | CUTANEOUS | 0 refills | Status: DC

## 2018-06-02 NOTE — Telephone Encounter (Signed)
Patient is calling with side effects from new birth control.

## 2018-06-02 NOTE — Telephone Encounter (Signed)
Spoke with patient. Patient had a Mirena IUD in place. Was given rx for Camilla on 05/26/2018 by Dr.Silva. Patient states since starting [place] on 05/26/2018 she has been having increased uterine cramping. Cramping is constant and moderate. Denies any sharp pain. Reports her vaginal is slightly swollen and red. Having a lot of vaginal pressure and dryness. Denies any fever, chills, nausea, or vomiting. No having any vaginal odor or discharge. IUD strings were trimmed at her OV on 05/26/2018. IUD is due for exchange in September. Advised will need to be seen in office for further evaluation. Appointment scheduled for today at 3 pm with Dr.Jertson. Patient is agreeable to date and time.  Routing to provider and will close encounter.

## 2018-06-02 NOTE — Progress Notes (Signed)
GYNECOLOGY  VISIT   HPI: 33 y.o.   Single Black or African American Not Hispanic or Latino  female   [phone] with No LMP recorded. (Menstrual status: IUD).   here for constant pressure, vaginal irritation, cramping. The patient saw Dr Quincy Simmonds on 05/26/18 for an annual exam. She was c/o bleeding with her IUD (due for exchange in the fall) and was started on the POP to see if that would help. She is not a candidate for OCP's.  She c/o a 3-4 day h/o lower abdominal pressure, vaginal tightening/irritation/dryness, feels swollen on the vulva. Occasionally itchy. She uses hypoallergenic soap.  No vaginal discharge. Last night she used refresh vaginal moisturizer. No vaginal odor.  No fevers, diarrhea, constipation. She has 2-3 BM's a day, normal for her. No change in her urinary frequency, urgency, no dysuria.   Recent pap with LSIL and +HPV, negative GC/CT testing. Negative vaginitis testing in 1/20. No change in partners, unable to use condoms secondary to irritation.   GYNECOLOGIC HISTORY: No LMP recorded. (Menstrual status: IUD). Contraception: IUD Menopausal hormone therapy: none        OB History    Gravida  3   Para  2   Term  2   Preterm  0   AB  1   Living  2     SAB  0   TAB  1   Ectopic  0   Multiple  0   Live Births  2              Patient Active Problem List   Diagnosis Date Noted  . GERD (gastroesophageal reflux disease) 03/09/2014  . Altered bowel habits 03/09/2014  . Bloating 03/09/2014  . Helicobacter pylori ab+ 03/09/2014  . 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 Menorrhagia 03/29/2013  . Encounter for counseling 03/29/2013  . Abdominal hernia 09/29/2011  . 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 Abdominal hernia   . Abnormal Pap smear of cervix 2005   HPV +  . Anemia   . Anxiety   . Chlamydia 08/18/2015   repeat testing negative October 2017.  Marland Kitchen Condyloma acuminatum of vulva   . Depression    but  doesn't require meds  . GERD (gastroesophageal reflux disease)    only takes OTC meds  . History of bronchitis 3+yrs ago  . History of migraine 09/20/11-last one  . HSV-1 (herpes simplex virus 1) infection   . HSV-2 (herpes simplex virus 2) infection   . Hyperlipidemia    postpartum 2+yrs ago  . Hypertension    postpartum 2+yrs ago  . Low vitamin D level 2018  . Migraine    with aura  . Sickle cell trait ([place])   . Trichomonas infection 09/2016    Past Surgical History:  Procedure Laterality Date  . ABDOMINOPLASTY  09/29/2011   Procedure: ABDOMINOPLASTY;  Surgeon: Theodoro Kos, DO;  Location: [facility];  Service: Plastics;  Laterality: N/A;  ABDOMINOPLASTY FOR REPAIR OF RECTUS DIASTASIS  . EYE SURGERY  1990's  . INTRAUTERINE DEVICE (IUD) INSERTION     10-31-13 mirena iud inserted  . OVARIAN CYST REMOVAL  2012  . VENTRAL HERNIA REPAIR  09/29/2011   Procedure: HERNIA REPAIR VENTRAL ADULT;  Surgeon: Joyice Faster. Cornett, MD;  Location: [facility];  Service: General;  Laterality: N/A;    Current Outpatient Medications  Medication Sig Dispense Refill  . acetaminophen (TYLENOL) 500 MG tablet Take 1,000 mg by mouth  every 6 (six) hours as needed. For pain    . aspirin-acetaminophen-caffeine (EXCEDRIN MIGRAINE) 250-250-65 MG per tablet Take 1 tablet by mouth daily as needed. For migraines    . fluticasone (FLONASE) 50 MCG/ACT nasal spray as needed.  0  . levonorgestrel (MIRENA) 20 MCG/24HR IUD 1 each by Intrauterine route once.    . norethindrone (MICRONOR,CAMILA,ERRIN) 0.35 MG tablet Take 1 tablet (0.35 mg total) by mouth daily. 3 Package 1  . omeprazole (PRILOSEC) 40 MG capsule Take 1 capsule by mouth as needed.  0  . ondansetron (ZOFRAN-ODT) 4 MG disintegrating tablet Take 1 tablet by mouth as needed.  0  . metroNIDAZOLE (METROGEL) 0.75 % vaginal gel Place applicator pv at hs twice weekly for 6 months. (Patient not taking: Reported on 05/26/2018) 70 g 6  . NONFORMULARY OR COMPOUNDED ITEM Boric acid  suppositories 600 mg. Place 1 suppository vaginally every week.  Dispense:  4, RF 2. (Patient not taking: Reported on 06/02/2018) 4 each 2   No current facility-administered medications for this visit.      ALLERGIES: Other; Percocet [oxycodone-acetaminophen]; and Valtrex [valacyclovir hcl]  Family History  Problem Relation Age of Onset  . Hypertension Father     Social History   Socioeconomic History  . Marital status: Single    Spouse name: Not on file  . Number of children: Not on file  . Years of education: Not on file  . Highest education level: Not on file  Occupational History  . Not on file  Social Needs  . Financial resource strain: Not on file  . Food insecurity:    Worry: Not on file    Inability: Not on file  . Transportation needs:    Medical: Not on file    Non-medical: Not on file  Tobacco Use  . Smoking status: Former Smoker    Types: Cigars  . Smokeless tobacco: Never Used  Substance and Sexual Activity  . Alcohol use: Not Currently    Alcohol/week: 0.0 standard drinks  . Drug use: No  . Sexual activity: Yes    Partners: Male    Birth control/protection: I.U.D.    Comment: Mirena IUD inserted 10-31-13  Lifestyle  . Physical activity:    Days per week: Not on file    Minutes per session: Not on file  . Stress: Not on file  Relationships  . Social connections:    Talks on phone: Not on file    Gets together: Not on file    Attends religious service: Not on file    Active member of club or organization: Not on file    Attends meetings of clubs or organizations: Not on file    Relationship status: Not on file  . Intimate partner violence:    Fear of current or ex partner: Not on file    Emotionally abused: Not on file    Physically abused: Not on file    Forced sexual activity: Not on file  Other Topics Concern  . Not on file  Social History Narrative  . Not on file    Review of Systems  Constitutional: Negative.   HENT: Negative.    Eyes: Negative.   Respiratory: Negative.   Cardiovascular: Negative.   Gastrointestinal: Negative.   Genitourinary:       Pressure, irritation   Musculoskeletal: Negative.   Skin: Negative.   Neurological: Negative.   Endo/Heme/Allergies: Negative.   Psychiatric/Behavioral: Negative.   All other systems reviewed and are negative.  PHYSICAL EXAMINATION:    BP 118/80   Pulse 80   Temp 98.4 F (36.9 C) (Oral)   Ht 5' 2.25" (1.581 m)   Wt 143 lb (64.9 kg)   BMI 25.95 kg/m     General appearance: alert, cooperative and appears stated age Abdomen: soft, mild BLQ tenderness, no rebound, no guarding. Most tender in the suprapubic region, non distended, no masses,  no organomegaly  Pelvic: External genitalia:  no lesions              Urethra:  normal appearing urethra with no masses, tenderness or lesions              Bartholins and Skenes: normal                 Vagina: normal appearing vagina with normal color and discharge, no lesions              Cervix: no cervical motion tenderness and no lesions. IUD string not seen              Bimanual Exam:  Uterus:  normal size, contour, position, consistency, mobility, non-tender and retroverted              Adnexa: no mass, fullness, tenderness              Rectovaginal: Yes.  .  Confirms.              Anus:  normal sphincter tone, no lesions  Bladder: tender  Chaperone was present for exam.  Wet prep: ? Clue vs artifact, no trich, few wbc KOH: no yeast PH: 4.5   ASSESSMENT Mild abdominal pain, unchanged urinary frequency and urgency, tender bladder.  Vulvovaginitis symptoms, no clear infection on slides    PLAN Send urine for ua, c&s Nuswab vaginitis panel sent Steroid ointment to vulva Discussed vulvar skin care, information given   An After Visit Summary was printed and given to the patient.  Over 20 minutes face to face time of which over 50% was spent in counseling.   CC: Dr Quincy Simmonds

## 2018-06-03 LAB — URINALYSIS, MICROSCOPIC ONLY
Bacteria, UA: NONE SEEN
Casts: NONE SEEN /lpf

## 2018-06-03 LAB — URINE CULTURE: Organism ID, Bacteria: NO GROWTH

## 2018-06-06 ENCOUNTER — Other Ambulatory Visit: Payer: Self-pay | Admitting: *Deleted

## 2018-06-06 DIAGNOSIS — R87810 Cervical high risk human papillomavirus (HPV) DNA test positive: Secondary | ICD-10-CM

## 2018-06-06 DIAGNOSIS — R87612 Low grade squamous intraepithelial lesion on cytologic smear of cervix (LGSIL): Secondary | ICD-10-CM

## 2018-06-06 LAB — NUSWAB VAGINITIS (VG)
Candida albicans, NAA: NEGATIVE
Candida glabrata, NAA: NEGATIVE
Trich vag by NAA: NEGATIVE

## 2018-06-09 ENCOUNTER — Telehealth: Payer: Self-pay | Admitting: *Deleted

## 2018-06-09 ENCOUNTER — Ambulatory Visit: Payer: Self-pay | Admitting: Obstetrics and Gynecology

## 2018-06-09 NOTE — Telephone Encounter (Signed)
Follow-up call to patient regarding colposcopy. Patient canceled appointment due to financial impact of Covid 19.  Reviewed with Dr Quincy Simmonds. Advised patient that colpo is recommended within next six months.  Also discussed financial arrangement options.  Patient will call back as soon as ready to proceed.  06 recall entered for 12-16-18.     Routing to Dr Quincy Simmonds. Encounter closed.

## 2018-06-19 ENCOUNTER — Ambulatory Visit (INDEPENDENT_AMBULATORY_CARE_PROVIDER_SITE_OTHER): Payer: BC Managed Care – PPO | Admitting: Obstetrics and Gynecology

## 2018-06-19 ENCOUNTER — Other Ambulatory Visit: Payer: Self-pay

## 2018-06-19 ENCOUNTER — Encounter: Payer: Self-pay | Admitting: Obstetrics and Gynecology

## 2018-06-19 VITALS — BP 120/60 | HR 68 | Temp 98.0°F | Resp 16 | Wt 142.0 lb

## 2018-06-19 DIAGNOSIS — R87612 Low grade squamous intraepithelial lesion on cytologic smear of cervix (LGSIL): Secondary | ICD-10-CM

## 2018-06-19 DIAGNOSIS — N761 Subacute and chronic vaginitis: Secondary | ICD-10-CM | POA: Diagnosis not present

## 2018-06-19 DIAGNOSIS — R87810 Cervical high risk human papillomavirus (HPV) DNA test positive: Secondary | ICD-10-CM

## 2018-06-19 DIAGNOSIS — N943 Premenstrual tension syndrome: Secondary | ICD-10-CM

## 2018-06-19 DIAGNOSIS — N939 Abnormal uterine and vaginal bleeding, unspecified: Secondary | ICD-10-CM

## 2018-06-19 NOTE — Progress Notes (Signed)
GYNECOLOGY  VISIT   HPI: 33 y.o.   Single  African American  female   [phone] with Patient's last menstrual period was 06/14/2018 (exact date).   here for colposcopy.   Patient is on POPs since April 10 or 11, 2020 to supplement her IUD to control bleeding.  She is having some spotting yesterday.   States she had an abnormal pap in 2005.  No colposcopy or treatment.   She is stating vaginal odor today.  She is spotting.  She has recurrent BV.  She was just tested on 06/02/18 and tested negative.   She declines future childbearing and does not want to take birth control if possible. States her bleeding is heavy if she is not on birth control. Considering endometrial ablation.   Her partner states she is moody the week prior to her menses. She notices this after her cycle is done.  She feels better when she starts her cycle.  She feels anxious and emotional during the premenstrual time. She has started taking Trazodone.   GYNECOLOGIC HISTORY: Patient's last menstrual period was 06/14/2018 (exact date). Contraception: IUD & micronor Menopausal hormone therapy:  none Last mammogram:  07-05-2014 left breast birads 1:neg Last pap smear:  05-26-2018 LGSIL HPV HR+        OB History    Gravida  3   Para  2   Term  2   Preterm  0   AB  1   Living  2     SAB  0   TAB  1   Ectopic  0   Multiple  0   Live Births  2              Patient Active Problem List   Diagnosis Date Noted  . GERD (gastroesophageal reflux disease) 03/09/2014  . Altered bowel habits 03/09/2014  . Bloating 03/09/2014  . Helicobacter pylori ab+ 03/09/2014  . 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 Menorrhagia 03/29/2013  . Encounter for counseling 03/29/2013  . Abdominal hernia 09/29/2011  . 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 Abdominal hernia   . Abnormal Pap smear of cervix 2005   HPV +, 05-26-2018 LGSIL HPV HR+  . Anemia   . Anxiety   .  Chlamydia 08/18/2015   repeat testing negative October 2017.  Marland Kitchen Condyloma acuminatum of vulva   . Depression    but doesn't require meds  . GERD (gastroesophageal reflux disease)    only takes OTC meds  . History of bronchitis 3+yrs ago  . History of migraine 09/20/11-last one  . HSV-1 (herpes simplex virus 1) infection   . HSV-2 (herpes simplex virus 2) infection   . Hyperlipidemia    postpartum 2+yrs ago  . Hypertension    postpartum 2+yrs ago  . Low vitamin D level 2018  . Migraine    with aura  . Sickle cell trait ([place])   . Trichomonas infection 09/2016    Past Surgical History:  Procedure Laterality Date  . ABDOMINOPLASTY  09/29/2011   Procedure: ABDOMINOPLASTY;  Surgeon: Theodoro Kos, DO;  Location: [facility];  Service: Plastics;  Laterality: N/A;  ABDOMINOPLASTY FOR REPAIR OF RECTUS DIASTASIS  . EYE SURGERY  1990's  . INTRAUTERINE DEVICE (IUD) INSERTION     10-31-13 mirena iud inserted  . OVARIAN CYST REMOVAL  2012  . VENTRAL HERNIA REPAIR  09/29/2011   Procedure: HERNIA REPAIR VENTRAL ADULT;  Surgeon: Joyice Faster. Cornett, MD;  Location: [facility];  Service: General;  Laterality: N/A;    Current Outpatient Medications  Medication Sig Dispense Refill  . acetaminophen (TYLENOL) 500 MG tablet Take 1,000 mg by mouth every 6 (six) hours as needed. For pain    . aspirin-acetaminophen-caffeine (EXCEDRIN MIGRAINE) 250-250-65 MG per tablet Take 1 tablet by mouth daily as needed. For migraines    . fluticasone (FLONASE) 50 MCG/ACT nasal spray as needed.  0  . levonorgestrel (MIRENA) 20 MCG/24HR IUD 1 each by Intrauterine route once.    . norethindrone (MICRONOR,CAMILA,ERRIN) 0.35 MG tablet Take 1 tablet (0.35 mg total) by mouth daily. 3 Package 1  . omeprazole (PRILOSEC) 40 MG capsule Take 1 capsule by mouth as needed.  0  . ondansetron (ZOFRAN-ODT) 4 MG disintegrating tablet Take 1 tablet by mouth as needed.  0  . traZODone (DESYREL) 50 MG tablet TK 1 T PO QD HS PRN    . metroNIDAZOLE  (METROGEL) 0.75 % vaginal gel Place applicator pv at hs twice weekly for 6 months. (Patient not taking: Reported on 05/26/2018) 70 g 6  . NONFORMULARY OR COMPOUNDED ITEM Boric acid suppositories 600 mg. Place 1 suppository vaginally every week.  Dispense:  4, RF 2. (Patient not taking: Reported on 06/02/2018) 4 each 2   No current facility-administered medications for this visit.      ALLERGIES: Other; Percocet [oxycodone-acetaminophen]; and Valtrex [valacyclovir hcl]  Family History  Problem Relation Age of Onset  . Hypertension Father     Social History   Socioeconomic History  . Marital status: Single    Spouse name: Not on file  . Number of children: Not on file  . Years of education: Not on file  . Highest education level: Not on file  Occupational History  . Not on file  Social Needs  . Financial resource strain: Not on file  . Food insecurity:    Worry: Not on file    Inability: Not on file  . Transportation needs:    Medical: Not on file    Non-medical: Not on file  Tobacco Use  . Smoking status: Former Smoker    Types: Cigars  . Smokeless tobacco: Never Used  Substance and Sexual Activity  . Alcohol use: Not Currently    Alcohol/week: 0.0 standard drinks  . Drug use: No  . Sexual activity: Yes    Partners: Male    Birth control/protection: I.U.D.    Comment: Mirena IUD inserted 10-31-13  Lifestyle  . Physical activity:    Days per week: Not on file    Minutes per session: Not on file  . Stress: Not on file  Relationships  . Social connections:    Talks on phone: Not on file    Gets together: Not on file    Attends religious service: Not on file    Active member of club or organization: Not on file    Attends meetings of clubs or organizations: Not on file    Relationship status: Not on file  . Intimate partner violence:    Fear of current or ex partner: Not on file    Emotionally abused: Not on file    Physically abused: Not on file    Forced sexual  activity: Not on file  Other Topics Concern  . Not on file  Social History Narrative  . Not on file    Review of Systems  Constitutional: Negative.   HENT: Negative.   Eyes: Negative.   Respiratory: Negative.  Cardiovascular: Negative.   Gastrointestinal: Negative.   Genitourinary:       Vaginal odor  Musculoskeletal: Negative.   Skin: Negative.   Neurological: Negative.   Hematological: Negative.   Psychiatric/Behavioral: Negative.     PHYSICAL EXAMINATION:    BP 120/60   Pulse 68   Temp 98 F (36.7 C) (Oral)   Resp 16   Wt 142 lb (64.4 kg)   LMP 06/14/2018 (Exact Date)   BMI 25.76 kg/m     General appearance: alert, cooperative and appears stated age   Colposcopy Consent for procedure.  3% acetic acid used.  White light and green light filter used.  Colposcopy satisfactory.  IUD strings noted and are short but visible. Cervix:  Acetowhite change at 12:00 and 3:00.   No vaginal lesions noted. Biopsies:  ECC, biopsy at 12:00, biopsy at 3:00 - all sent to pathology separately.  Monsel's placed.  Minimal EBL.  No complications.   Chaperone was present for exam.  ASSESSMENT  LGSIL, positive HR HPV. Abnormal uterine bleeding.   Has Mirena IUD and using POPs for cycle control.  Hx migraine with aura. Chronic bacterial vaginosis.  PMS.   PLAN  Affirm.  FU biopsies.  Post colposcopy instructions given.  We discussed various protocols for follow up of colposcopy depending on results - pap and HR HPV in one year, LEEP. We discussed additional tx options for abnormal uterine bleeding and heavy cycles - Depo Provera, endometrial ablation and laparoscopic bilateral salpingectomy, and laparoscopic hysterectomy with bilateral salpingectomy.  Written information given on the latter two.  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  She will take Trazodone regularly during her PMS time.    An After Visit Summary was printed and given to  the patient.  __25____ minutes face to face time of which over 50% was spent in counseling.

## 2018-06-19 NOTE — Patient Instructions (Addendum)
Colposcopy Post-procedure Instructions . Cramping is common.  You may take Ibuprofen, Aleve, or Tylenol for the cramping.  This should resolve within the next two to three days.   . You may have bright red spotting or blackish discharge for several days after your procedure.  The discharge occurs because of a topical solution used to stop bleeding at the biopsy site(s).  You should wear a mini pad for the next few days. . Refrain from putting anything in the vagina until the bleeding and/or discharge stops (usually less than a week). . You need to call the office if you have any pelvic pain, fever, heavy bleeding, or foul smelling vaginal discharge. . Shower or bathe as normal . You will be notified within one week of your biopsy results or we will discuss your results at your follow-up appointment if needed.  Total Laparoscopic Hysterectomy A total laparoscopic hysterectomy is a minimally invasive surgery to remove the uterus and cervix. The fallopian tubes and ovaries can also be removed (bilateral salpingo-oophorectomy) during this surgery, if necessary. This procedure may be done to treat problems such as:  Noncancerous growths in the uterus (uterine fibroids) that cause symptoms.  A condition that causes the lining of the uterus (endometrium) to grow in other areas (endometriosis).  Problems with pelvic support. This is caused by weakened muscles of the pelvis following vaginal childbirth or menopause.  Cancer of the cervix, ovaries, uterus, or endometrium.  Excessive (dysfunctional) uterine bleeding. This surgery is performed by inserting a thin, lighted tube (laparoscope) and surgical instruments into small incisions in the abdomen. The laparoscope sends images to a monitor. The images help the health care provider perform the procedure. After this procedure, you will no longer be able to have a baby, and you will no longer have a menstrual period. Tell a health care provider  about:  Any allergies you have.  All medicines you are taking, including vitamins, herbs, eye drops, creams, and over-the-counter medicines.  Any problems you or family members have had with anesthetic medicines.  Any blood disorders you have.  Any surgeries you have had.  Any medical conditions you have.  Whether you are pregnant or may be pregnant. What are the risks? Generally, this is a safe procedure. However, problems may occur, including:  Infection.  Bleeding.  Blood clots in the legs or lungs.  Allergic reactions to medicines.  Damage to other structures or organs.  The risk that the surgery may have to be switched to the regular one in which a large incision is made in the abdomen (abdominal hysterectomy). What happens before the procedure? Staying hydrated Follow instructions from your health care provider about hydration, which may include:  Up to 2 hours before the procedure - you may continue to drink clear liquids, such as water, clear fruit juice, black coffee, and plain tea Eating and drinking restrictions Follow instructions from your health care provider about eating and drinking, which may include:  8 hours before the procedure - stop eating heavy meals or foods such as meat, fried foods, or fatty foods.  6 hours before the procedure - stop eating light meals or foods, such as toast or cereal.  6 hours before the procedure - stop drinking milk or drinks that contain milk.  2 hours before the procedure - stop drinking clear liquids. Medicines  Ask your health care provider about: ? Changing or stopping your regular medicines. This is especially important if you are taking diabetes medicines or blood thinners. ?  Taking over-the-counter medicines, vitamins, herbs, and supplements. ? Taking medicines such as aspirin and ibuprofen. These medicines can thin your blood. Do not take these medicines unless your health care provider tells you to take  them.  You may be given antibiotic medicine to help prevent infection.  You may be asked to take laxatives.  You may be given medicines to help prevent nausea and vomiting after the procedure. General instructions  Ask your health care provider how your surgical site will be marked or identified.  You may be asked to shower with a germ-killing soap.  Do not use any products that contain nicotine or tobacco, such as cigarettes and e-cigarettes. If you need help quitting, ask your health care provider.  You may have an exam or testing, such as an ultrasound to determine the size and shape of your pelvic organs.  You may have a blood or urine sample taken.  This procedure can affect the way you feel about yourself. Talk with your health care provider about the physical and emotional changes hysterectomy may cause.  Plan to have someone take you home from the hospital or clinic.  Plan to have a responsible adult care for you for at least 24 hours after you leave the hospital or clinic. This is important. What happens during the procedure?  To lower your risk of infection: ? Your health care team will wash or sanitize their hands. ? Your skin will be washed with soap. ? Hair may be removed from the surgical area.  An IV will be inserted into one of your veins.  You will be given one or more of the following: ? A medicine to help you relax (sedative). ? A medicine to make you fall asleep (general anesthetic).  You will be given antibiotic medicine through your IV.  A tube may be inserted down your throat to help you breathe during the procedure.  A gas (carbon dioxide) will be used to inflate your abdomen to allow your surgeon to see inside of your abdomen.  Three or four small incisions will be made in your abdomen.  A laparoscope will be inserted into one of your incisions. Surgical instruments will be inserted through the other incisions in order to perform the  procedure.  Your uterus and cervix may be removed through your vagina or cut into small pieces and removed through the small incisions. Any other organs that need to be removed will also be removed this way.  Carbon dioxide will be released from inside of your abdomen.  Your incisions will be closed with stitches (sutures).  A bandage (dressing) may be placed over your incisions. The procedure may vary among health care providers and hospitals. What happens after the procedure?  Your blood pressure, heart rate, breathing rate, and blood oxygen level will be monitored until the medicines you were given have worn off.  You will be given medicine for pain and nausea as needed.  Do not drive for 24 hours if you received a sedative. Summary  Total Laparoscopic hysterectomy is a procedure to remove your uterus, cervix and sometimes the fallopian tubes and ovaries.  This procedure can affect the way you feel about yourself. Talk with your health care provider about the physical and emotional changes hysterectomy may cause.  After this procedure, you will no longer be able to have a baby, and you will no longer have a menstrual period.  You will be given pain medicine to control discomfort after this procedure. This  information is not intended to replace advice given to you by your health care provider. Make sure you discuss any questions you have with your health care provider. Document Released: 11/29/2006 Document Revised: 04/14/2016 Document Reviewed: 04/14/2016 Elsevier Interactive Patient Education  2019 [employer].  Endometrial Ablation Endometrial ablation is a procedure that destroys the thin inner layer of the lining of the uterus (endometrium). This procedure may be done: To stop heavy periods. To stop bleeding that is causing anemia. To control irregular bleeding. To treat bleeding caused by small tumors (fibroids) in the endometrium. This procedure is often an alternative  to major surgery, such as removal of the uterus and cervix (hysterectomy). As a result of this procedure: You may not be able to have children. However, if you are premenopausal (you have not gone through menopause): You may still have a small chance of getting pregnant. You will need to use a reliable method of birth control after the procedure to prevent pregnancy. You may stop having a menstrual period, or you may have only a small amount of bleeding during your period. Menstruation may return several years after the procedure. Tell a health care provider about: Any allergies you have. All medicines you are taking, including vitamins, herbs, eye drops, creams, and over-the-counter medicines. Any problems you or family members have had with the use of anesthetic medicines. Any blood disorders you have. Any surgeries you have had. Any medical conditions you have. What are the risks? Generally, this is a safe procedure. However, problems may occur, including: A hole (perforation) in the uterus or bowel. Infection of the uterus, bladder, or vagina. Bleeding. Damage to other structures or organs. An air bubble in the lung (air embolus). Problems with pregnancy after the procedure. Failure of the procedure. Decreased ability to diagnose cancer in the endometrium. What happens before the procedure? You will have tests of your endometrium to make sure there are no pre-cancerous cells or cancer cells present. You may have an ultrasound of the uterus. You may be given medicines to thin the endometrium. Ask your health care provider about: Changing or stopping your regular medicines. This is especially important if you take diabetes medicines or blood thinners. Taking medicines such as aspirin and ibuprofen. These medicines can thin your blood. Do not take these medicines before your procedure if your doctor tells you not to. Plan to have someone take you home from the hospital or clinic. What  happens during the procedure?  You will lie on an exam table with your feet and legs supported as in a pelvic exam. To lower your risk of infection: Your health care team will wash or sanitize their hands and put on germ-free (sterile) gloves. Your genital area will be washed with soap. An IV tube will be inserted into one of your veins. You will be given a medicine to help you relax (sedative). A surgical instrument with a light and camera (resectoscope) will be inserted into your vagina and moved into your uterus. This allows your surgeon to see inside your uterus. Endometrial tissue will be removed using one of the following methods: Radiofrequency. This method uses a radiofrequency-alternating electric current to remove the endometrium. Cryotherapy. This method uses extreme cold to freeze the endometrium. Heated-free liquid. This method uses a heated saltwater (saline) solution to remove the endometrium. Microwave. This method uses high-energy microwaves to heat up the endometrium and remove it. Thermal balloon. This method involves inserting a catheter with a balloon tip into the uterus. The  balloon tip is filled with heated fluid to remove the endometrium. The procedure may vary among health care providers and hospitals. What happens after the procedure? Your blood pressure, heart rate, breathing rate, and blood oxygen level will be monitored until the medicines you were given have worn off. As tissue healing occurs, you may notice vaginal bleeding for 4-6 weeks after the procedure. You may also experience: Cramps. Thin, watery vaginal discharge that is light pink or brown in color. A need to urinate more frequently than usual. Nausea. Do not drive for 24 hours if you were given a sedative. Do not have sex or insert anything into your vagina until your health care provider approves. Summary Endometrial ablation is done to treat the many causes of heavy menstrual bleeding. The  procedure may be done only after medications have been tried to control the bleeding. Plan to have someone take you home from the hospital or clinic. This information is not intended to replace advice given to you by your health care provider. Make sure you discuss any questions you have with your health care provider. Document Released: 12/12/2003 Document Revised: 07/19/2017 Document Reviewed: 02/19/2016 Elsevier Interactive Patient Education  2019 [employer].  Premenstrual Syndrome Premenstrual syndrome (PMS) is a group of physical, emotional, and behavioral symptoms that affect women of childbearing age. PMS starts 1-2 weeks before the start of a woman's period and goes away a few days after the period starts. It often recurs in a predictable pattern. PMS can range from mild to severe. When it is severe, it is called premenstrual dysphoric disorder (PMDD). PMS can interfere in many ways with normal daily activities. What are the causes? The cause of this condition is not known, but it seems to be related to hormone changes that happen before menstruation. What are the signs or symptoms? Symptoms of this condition often happen every month. They go away completely after your period starts. Physical symptoms include:  Bloating.  Breast pain.  Headaches.  Extreme fatigue.  Backaches.  Swelling of the hands and feet.  Weight gain.  Hot flashes. Emotional and behavioral symptoms include:  Mood swings.  Depression.  Angry outbursts.  Irritability.  Anxiety.  Crying spells.  Food cravings or appetite changes.  Changes in sexual desire.  Confusion.  Aggression.  Social withdrawal.  Poor concentration. How is this diagnosed? This condition is diagnosed if symptoms of PMS:  Are present in the 5 days before your period starts.  End within 4 days after your period starts.  Happen at least 3 months in a row.  Interfere with some of your normal  activities. Other conditions that can cause some of these symptoms must be ruled out before PMS can be diagnosed. How is this treated? This condition may be treated by:  Maintaining a healthy lifestyle. This includes eating a balanced diet and exercising regularly.  Taking medicines. Medicines can help relieve symptoms such as cramps, aches, pains, headaches, and breast tenderness. Depending on the severity of the condition, your health care provider may recommend: ? Over-the-counter pain medicines. ? Prescription medicines for PMDD. Follow these instructions at home: Eating and drinking   Eat a well-balanced diet.  Avoid caffeine and alcohol.  Limit the amount of salt and salty foods you eat. This will help lessen bloating.  Drink enough fluid to keep your urine clear or pale yellow.  Take a multivitamin if told to by your health care provider. Lifestyle  Do not use any tobacco products, such as cigarettes,  chewing tobacco, and e-cigarettes. If you need help quitting, ask your health care provider.  Exercise regularly as suggested by your health care provider.  Get enough sleep.  Practice relaxation techniques.  Limit stress. Other Instructions  For 2-3 months, write down your symptoms, their severity, and how long they last. This will help your health care provider choose the best treatment for you.  Take over-the-counter and prescription medicines only as told by your health care provider.  If you are using oral contraceptive pills, use them as told by your health care provider. This information is not intended to replace advice given to you by your health care provider. Make sure you discuss any questions you have with your health care provider. Document Released: 01/30/2000 Document Revised: 03/05/2015 Document Reviewed: 11/01/2014 Elsevier Interactive Patient Education  2019 [employer].

## 2018-06-20 LAB — VAGINITIS/VAGINOSIS, DNA PROBE
Candida Species: NEGATIVE
Gardnerella vaginalis: NEGATIVE
Trichomonas vaginosis: NEGATIVE

## 2018-06-23 ENCOUNTER — Encounter: Payer: Self-pay | Admitting: Obstetrics and Gynecology

## 2018-06-26 ENCOUNTER — Telehealth: Payer: Self-pay | Admitting: Obstetrics and Gynecology

## 2018-06-26 NOTE — Telephone Encounter (Signed)
Spoke with patient. Appointment scheduled for tomorrow at 1:30 pm with Dr.Silva. Patient is agreeable to date and time.  Routing to provider and will close encounter.

## 2018-06-26 NOTE — Telephone Encounter (Signed)
Left message to call Kaitlyn at [phone]. 

## 2018-06-26 NOTE — Telephone Encounter (Signed)
Spoke with patient. Patient states that she started having a fishy vaginal odor on 06/22/2018. Patient had a colposcopy on 06/19/2018. Affirm testing at that time was negative. Patient denies any pelvic/abdominal pain, fever, chills, nausea, or vomiting. Patient has a history of recurrent BV. Asking if she can restart her Metrogel at this time? Results review with patient from colposcopy seen below. Patient is agreeable to schedule LEEP. Advised will review scheduling as well with provider since patient may need to be seen in the office prior for vaginal odor or may need to use Metrogel before having LEEP.  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 She needs a LEEP with me in office to remove the abnormal cells. This will need precert as well.   Arco Malachi Pro

## 2018-06-26 NOTE — Telephone Encounter (Signed)
I would recommend that she come in for evaluation. She has had a complicated picture, managed by Dr Quincy Simmonds. Please set her up to see Dr Quincy Simmonds tomorrow.

## 2018-06-26 NOTE — Telephone Encounter (Signed)
Good evening Dr. Quincy Simmonds,    I am having some discharge with a strong fishy odor. Should I be concerned or is this normal following the procedure? Also, when could I begin using my MetroGel treatment again?     Thank you.

## 2018-06-27 ENCOUNTER — Other Ambulatory Visit (HOSPITAL_COMMUNITY)
Admission: RE | Admit: 2018-06-27 | Discharge: 2018-06-27 | Disposition: A | Discharge status: Home / Self Care | Payer: BC Managed Care – PPO | Source: Ambulatory Visit | Attending: Obstetrics and Gynecology | Admitting: Obstetrics and Gynecology

## 2018-06-27 ENCOUNTER — Encounter: Payer: Self-pay | Admitting: Obstetrics and Gynecology

## 2018-06-27 ENCOUNTER — Ambulatory Visit (INDEPENDENT_AMBULATORY_CARE_PROVIDER_SITE_OTHER): Payer: BC Managed Care – PPO | Admitting: Obstetrics and Gynecology

## 2018-06-27 ENCOUNTER — Other Ambulatory Visit: Payer: Self-pay

## 2018-06-27 VITALS — BP 128/74 | HR 72 | Temp 98.1°F | Resp 12 | Wt 142.0 lb

## 2018-06-27 DIAGNOSIS — N730 Acute parametritis and pelvic cellulitis: Secondary | ICD-10-CM | POA: Insufficient documentation

## 2018-06-27 DIAGNOSIS — N898 Other specified noninflammatory disorders of vagina: Secondary | ICD-10-CM

## 2018-06-27 DIAGNOSIS — R87613 High grade squamous intraepithelial lesion on cytologic smear of cervix (HGSIL): Secondary | ICD-10-CM

## 2018-06-27 MED ORDER — METRONIDAZOLE 500 MG PO TABS: 500.0000 mg | ORAL_TABLET | Freq: Two times a day (BID) | ORAL | 0 refills | Status: DC

## 2018-06-27 MED ORDER — CEFTRIAXONE SODIUM 250 MG IJ SOLR
250.0000 mg | Freq: Once | INTRAMUSCULAR | Status: AC
  Administered 2018-06-27: 15:00:00 250 mg via INTRAMUSCULAR

## 2018-06-27 MED ORDER — AZITHROMYCIN 1 G PO PACK: 1.0000 | PACK | Freq: Once | ORAL | 0 refills | Status: AC

## 2018-06-27 NOTE — Progress Notes (Signed)
GYNECOLOGY  VISIT   HPI: 33 y.o.   Single  African American  female   [phone] with Patient's last menstrual period was 06/14/2018 (exact date).   here for fishy odor following colpo. Patient states that she had severe pain in lower abdomen after intercourse last night. Patient denies having discharge.  Discharge and odor 4 days ago.   Intercourse 2 days ago, and had no problems.  Intercourse last night had lower abdominal pain.  This is new.  No further pain.  Aleve helped the pain.  No fever, shakes or chills.   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 She tested negative for BV on the same day.   Has not done Gardasil.   States HA with Flagyl. She is looking for alternatives to this.   Not sleeping well due to change in pattern from Covid 19.  GYNECOLOGIC HISTORY: Patient's last menstrual period was 06/14/2018 (exact date). Contraception:  IUD and Micronor Menopausal hormone therapy:  none Last mammogram:  07-05-2014 left breast birads 1:neg Last pap smear:   05-26-2018 LGSIL HPV HR+        OB History    Gravida  3   Para  2   Term  2   Preterm  0   AB  1   Living  2     SAB  0   TAB  1   Ectopic  0   Multiple  0   Live Births  2              Patient Active Problem List   Diagnosis Date Noted  . GERD (gastroesophageal reflux disease) 03/09/2014  . Altered bowel habits 03/09/2014  . Bloating 03/09/2014  . Helicobacter pylori ab+ 03/09/2014  . 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 Menorrhagia 03/29/2013  . Encounter for counseling 03/29/2013  . Abdominal hernia 09/29/2011  . 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 Abdominal hernia   . Abnormal Pap smear of cervix 2005   HPV +, 05-26-2018 LGSIL HPV HR+  . Anemia   . Anxiety   . Chlamydia 08/18/2015   repeat testing negative October 2017.  Marland Kitchen Condyloma acuminatum of vulva   . Depression    but doesn't require meds  . GERD (gastroesophageal  reflux disease)    only takes OTC meds  . History of bronchitis 3+yrs ago  . History of migraine 09/20/11-last one  . HSV-1 (herpes simplex virus 1) infection   . HSV-2 (herpes simplex virus 2) infection   . Hyperlipidemia    postpartum 2+yrs ago  . Hypertension    postpartum 2+yrs ago  . Low vitamin D level 2018  . Migraine    with aura  . Sickle cell trait ([place])   . Trichomonas infection 09/2016    Past Surgical History:  Procedure Laterality Date  . ABDOMINOPLASTY  09/29/2011   Procedure: ABDOMINOPLASTY;  Surgeon: Theodoro Kos, DO;  Location: [facility];  Service: Plastics;  Laterality: N/A;  ABDOMINOPLASTY FOR REPAIR OF RECTUS DIASTASIS  . EYE SURGERY  1990's  . INTRAUTERINE DEVICE (IUD) INSERTION     10-31-13 mirena iud inserted  . OVARIAN CYST REMOVAL  2012  . VENTRAL HERNIA REPAIR  09/29/2011   Procedure: HERNIA REPAIR VENTRAL ADULT;  Surgeon: Joyice Faster. Cornett, MD;  Location: [facility];  Service: General;  Laterality: N/A;    Current Outpatient Medications  Medication Sig Dispense Refill  . acetaminophen (TYLENOL) 500 MG tablet  Take 1,000 mg by mouth every 6 (six) hours as needed. For pain    . aspirin-acetaminophen-caffeine (EXCEDRIN MIGRAINE) 250-250-65 MG per tablet Take 1 tablet by mouth daily as needed. For migraines    . fluticasone (FLONASE) 50 MCG/ACT nasal spray as needed.  0  . levonorgestrel (MIRENA) 20 MCG/24HR IUD 1 each by Intrauterine route once.    . metroNIDAZOLE (METROGEL) 0.75 % vaginal gel Place applicator pv at hs twice weekly for 6 months. (Patient not taking: Reported on 05/26/2018) 70 g 6  . NONFORMULARY OR COMPOUNDED ITEM Boric acid suppositories 600 mg. Place 1 suppository vaginally every week.  Dispense:  4, RF 2. (Patient not taking: Reported on 06/02/2018) 4 each 2  . norethindrone (MICRONOR,CAMILA,ERRIN) 0.35 MG tablet Take 1 tablet (0.35 mg total) by mouth daily. 3 Package 1  . omeprazole (PRILOSEC) 40 MG capsule Take 1 capsule by mouth as needed.  0  .  ondansetron (ZOFRAN-ODT) 4 MG disintegrating tablet Take 1 tablet by mouth as needed.  0  . traZODone (DESYREL) 50 MG tablet TK 1 T PO QD HS PRN     No current facility-administered medications for this visit.      ALLERGIES: Other; Percocet [oxycodone-acetaminophen]; and Valtrex [valacyclovir hcl]  Family History  Problem Relation Age of Onset  . Hypertension Father     Social History   Socioeconomic History  . Marital status: Single    Spouse name: Not on file  . Number of children: Not on file  . Years of education: Not on file  . Highest education level: Not on file  Occupational History  . Not on file  Social Needs  . Financial resource strain: Not on file  . Food insecurity:    Worry: Not on file    Inability: Not on file  . Transportation needs:    Medical: Not on file    Non-medical: Not on file  Tobacco Use  . Smoking status: Former Smoker    Types: Cigars  . Smokeless tobacco: Never Used  Substance and Sexual Activity  . Alcohol use: Not Currently    Alcohol/week: 0.0 standard drinks  . Drug use: No  . Sexual activity: Yes    Partners: Male    Birth control/protection: I.U.D.    Comment: Mirena IUD inserted 10-31-13  Lifestyle  . Physical activity:    Days per week: Not on file    Minutes per session: Not on file  . Stress: Not on file  Relationships  . Social connections:    Talks on phone: Not on file    Gets together: Not on file    Attends religious service: Not on file    Active member of club or organization: Not on file    Attends meetings of clubs or organizations: Not on file    Relationship status: Not on file  . Intimate partner violence:    Fear of current or ex partner: Not on file    Emotionally abused: Not on file    Physically abused: Not on file    Forced sexual activity: Not on file  Other Topics Concern  . Not on file  Social History Narrative  . Not on file    Review of Systems  Constitutional: Negative.   HENT:  Negative.   Eyes: Negative.   Respiratory: Negative.   Cardiovascular: Negative.   Gastrointestinal: Negative.   Endocrine: Negative.   Genitourinary:       Vaginal odor  Musculoskeletal: Negative.   Skin:  Negative.   Allergic/Immunologic: Negative.   Neurological: Negative.   Hematological: Negative.   Psychiatric/Behavioral: Negative.     PHYSICAL EXAMINATION:    BP 128/74 (BP Location: Right Arm, Patient Position: Sitting, Cuff Size: Normal)   Pulse 72   Temp 98.1 F (36.7 C) (Oral)   Resp 12   Wt 142 lb (64.4 kg)   LMP 06/14/2018 (Exact Date)   BMI 25.76 kg/m     General appearance: alert, cooperative and appears stated age   Pelvic: External genitalia:  no lesions              Urethra:  normal appearing urethra with no masses, tenderness or lesions              Bartholins and Skenes: normal                 Vagina: normal appearing vagina with normal color and discharge, no lesions              Cervix: no lesions.  CMT positive.  IUD strings noted.                 Bimanual Exam:  Uterus:  normal size, contour, position, consistency, mobility, mildly tender              Adnexa: no mass, fullness, bilateral tenderness.               Chaperone was present for exam.  ASSESSMENT  Hx recurrent BV.  Recent instrumentation with colposcopy.  HGSIL.  Mild PID. Mirena IUD.  PLAN  Will test for GC/CT. Affirm also done.  Will tx today with Ceftriaxone 250 mg IM, Azithromycin 1 gram orally q week x 2 weeks, and Flagyl 500 mg po bid x 2 weeks.   (We had an extensive discussion regarding options for tx for BV.  Initially I was going to prescribe Clindamycin 450 mg po tid x 2 weeks.  Ultimately, the patient chose to do the Flagyl instead.) We reviewed her colposcopy results and treatment with LEEP.  I reviewed risks and benefits.  Risks may include but are not limited to bleeding, infection, damage to surrounding organs, increased risk of PTL/PTD, cervical scarring making if  more difficult to evaluate the cervix in the future, inability to eradicate HPV, and accidental removal of the IUD or push the strings up in the cervix making removal more difficult in the future.   An After Visit Summary was printed and given to the patient.  __25____ minutes face to face time of which over 50% was spent in counseling.

## 2018-06-27 NOTE — Patient Instructions (Signed)
Pelvic Inflammatory Disease Pelvic inflammatory disease (PID) is an infection in some or all of the female organs. PID can be in the uterus, ovaries, fallopian tubes, or the surrounding tissues that are inside the lower belly area (pelvis). PID can lead to lasting problems if it is not treated. To check for this disease, your doctor may:  Do a physical exam.  Do blood tests, urine tests, or a pregnancy test.  Look at your vaginal discharge.  Do tests to look inside the pelvis.  Test you for other infections. Follow these instructions at home:  Take over-the-counter and prescription medicines only as told by your doctor.  If you were prescribed an antibiotic medicine, take it as told by your doctor. Do not stop taking it even if you start to feel better.  Do not have sex until treatment is done or as told by your doctor.  Tell your sex partner if you have PID. Your partner may need to be treated.  Keep all follow-up visits as told by your doctor. This is important.  Your doctor may test you for infection again 3 months after you are treated. Contact a doctor if:  You have more fluid (discharge) coming from your vagina or fluid that is not normal.  Your pain does not improve.  You throw up (vomit).  You have a fever.  You cannot take your medicines.  Your partner has a sexually transmitted disease (STD).  You have pain when you pee (urinate). Get help right away if:  You have more belly (abdominal) or lower belly pain.  You have chills.  You are not better after 72 hours. This information is not intended to replace advice given to you by your health care provider. Make sure you discuss any questions you have with your health care provider. Document Released: 04/30/2008 Document Revised: 07/10/2015 Document Reviewed: 03/11/2014 Elsevier Interactive Patient Education  2019 [employer].

## 2018-06-28 LAB — VAGINITIS/VAGINOSIS, DNA PROBE
Candida Species: NEGATIVE
Gardnerella vaginalis: POSITIVE — AB
Trichomonas vaginosis: NEGATIVE

## 2018-06-29 ENCOUNTER — Telehealth: Payer: Self-pay

## 2018-06-29 NOTE — Telephone Encounter (Signed)
-----   Message from Nunzio Cobbs, MD sent at 06/29/2018  4:17 PM EDT ----- Please contact patient with results of testing showing BV.  She has this chronically.  I am currently treating her for PID.  She received Ceftriaxone, and I have her a prescription for Azithromycin and Clindamycin.   Her testing for gonorrhea and chlamydia are still pending.   Please let me know how her pain is doing.  She has a recheck with me for 2 weeks.  If she is not feeling better, I would like to have her return to the office tomorrow.

## 2018-06-29 NOTE — Telephone Encounter (Signed)
Spoke with patient. Results given as seen below. Patient verbalizes understanding. Patient states that she is no longer having any pain at all. 2 week recheck scheduled for 5/29 at 3 pm with Dr.Silva. Patient is agreeable to date and time. Aware she will be contacted once the remainder of her results return.  Routing to provider and will close encounter.

## 2018-06-30 LAB — CYTOLOGY - PAP
Chlamydia: NEGATIVE
Neisseria Gonorrhea: NEGATIVE

## 2018-07-04 ENCOUNTER — Other Ambulatory Visit: Payer: Self-pay | Admitting: *Deleted

## 2018-07-04 DIAGNOSIS — R87613 High grade squamous intraepithelial lesion on cytologic smear of cervix (HGSIL): Secondary | ICD-10-CM

## 2018-07-12 ENCOUNTER — Telehealth: Payer: Self-pay | Admitting: Obstetrics and Gynecology

## 2018-07-12 ENCOUNTER — Other Ambulatory Visit: Payer: Self-pay

## 2018-07-12 MED ORDER — AZITHROMYCIN 250 MG PO TABS: 1000.0000 mg | ORAL_TABLET | Freq: Once | ORAL | 0 refills | Status: AC

## 2018-07-12 NOTE — Telephone Encounter (Signed)
Spoke with patient. Patient on abx for PID and BV, also due for LEEP procedure. Took 2nd dose of Azithromycin 1 gram powder packet on 5/25, vomited medication up immediately after. Patient states she has been experiencing nausea due to the flagyl. Did not take flagyl on Sunday or Monday d/t nausea, has 4 days of medication left. Is scheduled for 2wk f/u on 5/29. Denies pelvic pain, fever/chills. Patient request to move OV to later date and needs refill on azithromycin.   Advised I will review with Dr. Quincy Simmonds and return call, patient agreeable.   Dr. Quincy Simmonds -please review and advise on azithromycin and f/u.

## 2018-07-12 NOTE — Telephone Encounter (Signed)
Spoke with patient to confirm appointment for 2 week recheck 07/14/2018. She would like to know if it would be okay to push the appointment out a little further. States she is currently still taking her medication.

## 2018-07-12 NOTE — Telephone Encounter (Signed)
Spoke with patient, advised as seen below per Dr. Quincy Simmonds. Patient request Azithromycin tabs instead of powder, RX to verified pharmacy. Recommended patient take with food. Will keep OV as scheduled with Dr. Quincy Simmonds on 5/29. Patient verbalizes understanding and is agreeable.   Encounter closed.

## 2018-07-12 NOTE — Telephone Encounter (Signed)
Ok for Azithromycin refill.  I recommend she keep her follow up appointment as scheduled.

## 2018-07-14 ENCOUNTER — Encounter: Payer: Self-pay | Admitting: Obstetrics and Gynecology

## 2018-07-14 ENCOUNTER — Other Ambulatory Visit: Payer: Self-pay

## 2018-07-14 ENCOUNTER — Ambulatory Visit (INDEPENDENT_AMBULATORY_CARE_PROVIDER_SITE_OTHER): Payer: BC Managed Care – PPO | Admitting: Obstetrics and Gynecology

## 2018-07-14 VITALS — BP 108/60 | HR 84 | Temp 98.0°F | Resp 14 | Ht 62.25 in | Wt 142.0 lb

## 2018-07-14 DIAGNOSIS — N739 Female pelvic inflammatory disease, unspecified: Secondary | ICD-10-CM | POA: Diagnosis not present

## 2018-07-14 DIAGNOSIS — R87613 High grade squamous intraepithelial lesion on cytologic smear of cervix (HGSIL): Secondary | ICD-10-CM | POA: Diagnosis not present

## 2018-07-14 DIAGNOSIS — N760 Acute vaginitis: Secondary | ICD-10-CM

## 2018-07-14 NOTE — Progress Notes (Signed)
GYNECOLOGY  VISIT   HPI: 33 y.o.   Single  African American  female   [phone] with Patient's last menstrual period was 06/14/2018 (exact date).   here for 2 week recheck     Had been treated for PID.  She reported severe pain following intercourse that was long lasting.  Her treatment was Ceftriaxone 250 mg IM, Azithromycin 1 gm orally q week x 2 weeks, and Flagyl 500 mg po bid x 2 weeks.  She vomited with the second dose of Azithromycin and was re-prescribed this in oral tablet form.   She has had some pain, but is having a difficult time differentiating her pain.  She is having light bleeding.    Developed pain following colposcopy with biopsy.  She has HGSIL and needs a LEEP.  She has a Mirena IUD.  GC/CT negative.  Testing for BV was positive.   Less interested in sex.  Worried about BV.  States her partner has not noted any symptoms in her.   GYNECOLOGIC HISTORY: Patient's last menstrual period was 06/14/2018 (exact date). Contraception:  IUD and Micronor Menopausal hormone therapy:  n/a Last mammogram:  07-05-2014 left breast birads 1:neg Last pap smear: 06/19/18 Colpo showed high grade dysplasia; 05-26-2018 LGSIL HPV HR+        OB History    Gravida  3   Para  2   Term  2   Preterm  0   AB  1   Living  2     SAB  0   TAB  1   Ectopic  0   Multiple  0   Live Births  2              Patient Active Problem List   Diagnosis Date Noted  . GERD (gastroesophageal reflux disease) 03/09/2014  . Altered bowel habits 03/09/2014  . Bloating 03/09/2014  . Helicobacter pylori ab+ 03/09/2014  . 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 Menorrhagia 03/29/2013  . Encounter for counseling 03/29/2013  . Abdominal hernia 09/29/2011  . 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 Abdominal hernia   . Abnormal Pap smear of cervix 2005   HPV +, 05-26-2018 LGSIL HPV HR+  . Anemia   . Anxiety   . Chlamydia 08/18/2015   repeat  testing negative October 2017.  Marland Kitchen Condyloma acuminatum of vulva   . Depression    but doesn't require meds  . GERD (gastroesophageal reflux disease)    only takes OTC meds  . History of bronchitis 3+yrs ago  . History of migraine 09/20/11-last one  . HSV-1 (herpes simplex virus 1) infection   . HSV-2 (herpes simplex virus 2) infection   . Hyperlipidemia    postpartum 2+yrs ago  . Hypertension    postpartum 2+yrs ago  . Low vitamin D level 2018  . Migraine    with aura  . Sickle cell trait ([place])   . Trichomonas infection 09/2016    Past Surgical History:  Procedure Laterality Date  . ABDOMINOPLASTY  09/29/2011   Procedure: ABDOMINOPLASTY;  Surgeon: Theodoro Kos, DO;  Location: [facility];  Service: Plastics;  Laterality: N/A;  ABDOMINOPLASTY FOR REPAIR OF RECTUS DIASTASIS  . EYE SURGERY  1990's  . INTRAUTERINE DEVICE (IUD) INSERTION     10-31-13 mirena iud inserted  . OVARIAN CYST REMOVAL  2012  . VENTRAL HERNIA REPAIR  09/29/2011   Procedure: HERNIA REPAIR VENTRAL ADULT;  Surgeon: Joyice Faster. Cornett,  MD;  Location: [facility];  Service: General;  Laterality: N/A;    Current Outpatient Medications  Medication Sig Dispense Refill  . acetaminophen (TYLENOL) 500 MG tablet Take 1,000 mg by mouth every 6 (six) hours as needed. For pain    . aspirin-acetaminophen-caffeine (EXCEDRIN MIGRAINE) 250-250-65 MG per tablet Take 1 tablet by mouth daily as needed. For migraines    . fluconazole (DIFLUCAN) 150 MG tablet     . fluticasone (FLONASE) 50 MCG/ACT nasal spray as needed.  0  . levonorgestrel (MIRENA) 20 MCG/24HR IUD 1 each by Intrauterine route once.    . metroNIDAZOLE (FLAGYL) 500 MG tablet Take 1 tablet (500 mg total) by mouth 2 (two) times daily. Take for 14 days. 28 tablet 0  . NONFORMULARY OR COMPOUNDED ITEM Boric acid suppositories 600 mg. Place 1 suppository vaginally every week.  Dispense:  4, RF 2. 4 each 2  . norethindrone (MICRONOR,CAMILA,ERRIN) 0.35 MG tablet Take 1 tablet (0.35 mg  total) by mouth daily. 3 Package 1  . omeprazole (PRILOSEC) 40 MG capsule Take 1 capsule by mouth as needed.  0  . ondansetron (ZOFRAN-ODT) 4 MG disintegrating tablet Take 1 tablet by mouth as needed.  0  . traZODone (DESYREL) 50 MG tablet TK 1 T PO QD HS PRN    . metroNIDAZOLE (METROGEL) 0.75 % vaginal gel Place applicator pv at hs twice weekly for 6 months. (Patient not taking: Reported on 07/14/2018) 70 g 6   No current facility-administered medications for this visit.      ALLERGIES: Other; Percocet [oxycodone-acetaminophen]; and Valtrex [valacyclovir hcl]  Family History  Problem Relation Age of Onset  . Hypertension Father     Social History   Socioeconomic History  . Marital status: Single    Spouse name: Not on file  . Number of children: Not on file  . Years of education: Not on file  . Highest education level: Not on file  Occupational History  . Not on file  Social Needs  . Financial resource strain: Not on file  . Food insecurity:    Worry: Not on file    Inability: Not on file  . Transportation needs:    Medical: Not on file    Non-medical: Not on file  Tobacco Use  . Smoking status: Former Smoker    Types: Cigars  . Smokeless tobacco: Never Used  Substance and Sexual Activity  . Alcohol use: Not Currently    Alcohol/week: 0.0 standard drinks  . Drug use: No  . Sexual activity: Yes    Partners: Male    Birth control/protection: I.U.D.    Comment: Mirena IUD inserted 10-31-13  Lifestyle  . Physical activity:    Days per week: Not on file    Minutes per session: Not on file  . Stress: Not on file  Relationships  . Social connections:    Talks on phone: Not on file    Gets together: Not on file    Attends religious service: Not on file    Active member of club or organization: Not on file    Attends meetings of clubs or organizations: Not on file    Relationship status: Not on file  . Intimate partner violence:    Fear of current or ex partner: Not  on file    Emotionally abused: Not on file    Physically abused: Not on file    Forced sexual activity: Not on file  Other Topics Concern  . Not on file  Social History Narrative  . Not on file    Review of Systems  Constitutional: Negative.   HENT: Negative.   Eyes: Negative.   Respiratory: Negative.   Cardiovascular: Negative.   Gastrointestinal: Negative.   Endocrine: Negative.   Genitourinary: Negative.   Musculoskeletal: Negative.   Skin: Negative.   Allergic/Immunologic: Negative.   Neurological: Negative.   Hematological: Negative.   Psychiatric/Behavioral: Negative.     PHYSICAL EXAMINATION:    BP 108/60 (BP Location: Right Arm, Patient Position: Sitting, Cuff Size: Normal)   Pulse 84   Temp 98 F (36.7 C) (Temporal)   Resp 14   Ht 5' 2.25" (1.581 m)   Wt 142 lb (64.4 kg)   LMP 06/14/2018 (Exact Date)   BMI 25.76 kg/m     General appearance: alert, cooperative and appears stated age   Pelvic: External genitalia:  no lesions              Urethra:  normal appearing urethra with no masses, tenderness or lesions              Bartholins and Skenes: normal                 Vagina: normal appearing vagina with normal color and discharge, no lesions              Cervix: no lesions.  IUD strings noted.                 Bimanual Exam:  Uterus:  normal size, contour, position, consistency, mobility, non-tender              Adnexa: no mass, fullness, tenderness               Chaperone was present for exam.  ASSESSMENT   Migraine with aura.  Mirena IUD.  Also on Micronor.  Spotting, pain and headaches are more controlled by adding the Micronor.  Hx BV.  HGSIL. Recent PID.   PLAN  She will complete her tx for PID - second dose of Azithromycin, few more days of Flagyl. Return for LEEP.  We talked about BV at length.   An After Visit Summary was printed and given to the patient.  ___25___ minutes face to face time of which over 50% was spent in counseling.

## 2018-07-25 ENCOUNTER — Telehealth: Payer: Self-pay | Admitting: Obstetrics and Gynecology

## 2018-07-25 NOTE — Telephone Encounter (Signed)
Call placed to patient to review benefits and schedule [facility]. Patient requested to schedule 08/21/2018, due to her schedule and upcoming vacation.Patient is scheduled 08/21/2018 with Dr Quincy Simmonds. Patient is aware of appointment date, arrival time and cancellation policy. No further questions  Routing to Dr Quincy Simmonds for final review. Patient is agreeable to disposition. Will close encounter  cc: Glorianne Manchester, RN

## 2018-07-31 ENCOUNTER — Telehealth: Payer: Self-pay | Admitting: Obstetrics and Gynecology

## 2018-07-31 NOTE — Telephone Encounter (Signed)
Patient is requesting an appointment with Dr. Quincy Simmonds tomorrow regarding urgency and discomfort when urinating. Patient stated that this is an ongoing issue.

## 2018-07-31 NOTE — Telephone Encounter (Signed)
Spoke with patient. She is requesting appointment with Dr.Silva tomorrow. She is complaining of vaginal dryness. Patient also complains of urinary urgency/frequency which has been an ongoing complaint since first of the year per patient. She denies any hematuria or fever. Offered to work her into office schedule today, but she states tomorrow is better for her. Advised if symptoms worsen to go to urgent care or E.R.. Made appointment with Dr.Silva for 08-01-18 at 4:30pm, patient to arrive at 4:15pm.

## 2018-08-01 ENCOUNTER — Other Ambulatory Visit: Payer: Self-pay

## 2018-08-01 ENCOUNTER — Ambulatory Visit (INDEPENDENT_AMBULATORY_CARE_PROVIDER_SITE_OTHER): Payer: BC Managed Care – PPO | Admitting: Obstetrics and Gynecology

## 2018-08-01 ENCOUNTER — Encounter: Payer: Self-pay | Admitting: Obstetrics and Gynecology

## 2018-08-01 VITALS — BP 110/58 | HR 72 | Temp 97.8°F | Resp 12 | Ht 62.25 in | Wt 146.0 lb

## 2018-08-01 DIAGNOSIS — R3989 Other symptoms and signs involving the genitourinary system: Secondary | ICD-10-CM | POA: Diagnosis not present

## 2018-08-01 DIAGNOSIS — N898 Other specified noninflammatory disorders of vagina: Secondary | ICD-10-CM

## 2018-08-01 DIAGNOSIS — N816 Rectocele: Secondary | ICD-10-CM | POA: Diagnosis not present

## 2018-08-01 DIAGNOSIS — N8110 Cystocele, unspecified: Secondary | ICD-10-CM | POA: Diagnosis not present

## 2018-08-01 LAB — POCT URINALYSIS DIPSTICK
Bilirubin, UA: NEGATIVE
Blood, UA: NEGATIVE
Glucose, UA: NEGATIVE
Ketones, UA: NEGATIVE
Leukocytes, UA: NEGATIVE
Nitrite, UA: NEGATIVE
Protein, UA: NEGATIVE
Urobilinogen, UA: 0.2 E.U./dL
pH, UA: 5 (ref 5.0–8.0)

## 2018-08-01 NOTE — Progress Notes (Signed)
GYNECOLOGY  VISIT   HPI: 33 y.o.   Single  African American  female   [phone] with No LMP recorded. (Menstrual status: IUD).   here for vaginal dryness, urinary frequency, urgency, bladder pain, vaginal irritation  She has midline lower abdominal pain x 1 week.   No fevers, chills.  Some nausea.  No vomiting.  She had diarrhea recently in the last week.  This has resolved.   Some vaginal irritation.  No itching or burning.  No change in personal products.  Using sensitive skin baby wipes, which she has been using.  States that heaviness in her pelvis and dryness are her more prominent symptoms.   She tired to check her strings on her IUD. The vaginal felt swollen and dry.  Does not feel like it is BV this time.  The irritative symptoms are all new for her.   She sees a vaginal bulge.  Leaks urine with a cough, laugh.  Occurs often.  Urinary frequency.  Has medication for this but does not take it.   Patient was just treated for PID following a colposcopy procedure.  She is awaiting LEEP.   No periods with her Mirena.  No hot flashes.  States she is always cold. TSH normal on 05/26/18.  Urine: Negative  GYNECOLOGIC HISTORY: No LMP recorded. (Menstrual status: IUD). Contraception:  IUD and Micronor Menopausal hormone therapy:  n/a Last mammogram:  07-05-2014 left breast birads 1:neg Last pap smear:   06/19/18 Colpo showed high grade dysplasia; 05/26/18 LGSIL HPV HR+        OB History    Gravida  3   Para  2   Term  2   Preterm  0   AB  1   Living  2     SAB  0   TAB  1   Ectopic  0   Multiple  0   Live Births  2              Patient Active Problem List   Diagnosis Date Noted  . GERD (gastroesophageal reflux disease) 03/09/2014  . Altered bowel habits 03/09/2014  . Bloating 03/09/2014  . Helicobacter pylori ab+ 03/09/2014  . 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 Menorrhagia 03/29/2013  . Encounter for counseling 03/29/2013  .  Abdominal hernia 09/29/2011  . 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 Abdominal hernia   . Abnormal Pap smear of cervix 2005   HPV +, 05-26-2018 LGSIL HPV HR+  . Anemia   . Anxiety   . Chlamydia 08/18/2015   repeat testing negative October 2017.  Marland Kitchen Condyloma acuminatum of vulva   . Depression    but doesn't require meds  . GERD (gastroesophageal reflux disease)    only takes OTC meds  . History of bronchitis 3+yrs ago  . History of migraine 09/20/11-last one  . HSV-1 (herpes simplex virus 1) infection   . HSV-2 (herpes simplex virus 2) infection   . Hyperlipidemia    postpartum 2+yrs ago  . Hypertension    postpartum 2+yrs ago  . Low vitamin D level 2018  . Migraine    with aura  . Sickle cell trait ([place])   . Trichomonas infection 09/2016    Past Surgical History:  Procedure Laterality Date  . ABDOMINOPLASTY  09/29/2011   Procedure: ABDOMINOPLASTY;  Surgeon: Theodoro Kos, DO;  Location: [place];  Service: Plastics;  Laterality: N/A;  ABDOMINOPLASTY FOR REPAIR OF RECTUS  DIASTASIS  . EYE SURGERY  1990's  . INTRAUTERINE DEVICE (IUD) INSERTION     10-31-13 mirena iud inserted  . OVARIAN CYST REMOVAL  2012  . VENTRAL HERNIA REPAIR  09/29/2011   Procedure: HERNIA REPAIR VENTRAL ADULT;  Surgeon: Joyice Faster. Cornett, MD;  Location: [place] [facility];  Service: General;  Laterality: N/A;    Current Outpatient Medications  Medication Sig Dispense Refill  . acetaminophen (TYLENOL) 500 MG tablet Take 1,000 mg by mouth every 6 (six) hours as needed. For pain    . aspirin-acetaminophen-caffeine (EXCEDRIN MIGRAINE) 250-250-65 MG per tablet Take 1 tablet by mouth daily as needed. For migraines    . fluticasone (FLONASE) 50 MCG/ACT nasal spray as needed.  0  . levonorgestrel (MIRENA) 20 MCG/24HR IUD 1 each by Intrauterine route once.    . NONFORMULARY OR COMPOUNDED ITEM Boric acid suppositories 600 mg. Place 1 suppository vaginally every week.  Dispense:  4, RF 2. 4 each  2  . norethindrone (MICRONOR,CAMILA,ERRIN) 0.35 MG tablet Take 1 tablet (0.35 mg total) by mouth daily. 3 Package 1  . omeprazole (PRILOSEC) 40 MG capsule Take 1 capsule by mouth as needed.  0  . ondansetron (ZOFRAN-ODT) 4 MG disintegrating tablet Take 1 tablet by mouth as needed.  0  . traZODone (DESYREL) 50 MG tablet TK 1 T PO QD HS PRN    . fluconazole (DIFLUCAN) 150 MG tablet     . metroNIDAZOLE (FLAGYL) 500 MG tablet Take 1 tablet (500 mg total) by mouth 2 (two) times daily. Take for 14 days. (Patient not taking: Reported on 08/01/2018) 28 tablet 0  . metroNIDAZOLE (METROGEL) 0.75 % vaginal gel Place applicator pv at hs twice weekly for 6 months. (Patient not taking: Reported on 07/14/2018) 70 g 6   No current facility-administered medications for this visit.      ALLERGIES: Other, Percocet [oxycodone-acetaminophen], and Valtrex [valacyclovir hcl]  Family History  Problem Relation Age of Onset  . Hypertension Father     Social History   Socioeconomic History  . Marital status: Single    Spouse name: Not on file  . Number of children: Not on file  . Years of education: Not on file  . Highest education level: Not on file  Occupational History  . Not on file  Social Needs  . Financial resource strain: Not on file  . Food insecurity    Worry: Not on file    Inability: Not on file  . Transportation needs    Medical: Not on file    Non-medical: Not on file  Tobacco Use  . Smoking status: Former Smoker    Types: Cigars  . Smokeless tobacco: Never Used  Substance and Sexual Activity  . Alcohol use: Not Currently    Alcohol/week: 0.0 standard drinks  . Drug use: No  . Sexual activity: Yes    Partners: Male    Birth control/protection: I.U.D.    Comment: Mirena IUD inserted 10-31-13  Lifestyle  . Physical activity    Days per week: Not on file    Minutes per session: Not on file  . Stress: Not on file  Relationships  . Social Herbalist on phone: Not on file     Gets together: Not on file    Attends religious service: Not on file    Active member of club or organization: Not on file    Attends meetings of clubs or organizations: Not on file    Relationship status: Not  on file  . Intimate partner violence    Fear of current or ex partner: Not on file    Emotionally abused: Not on file    Physically abused: Not on file    Forced sexual activity: Not on file  Other Topics Concern  . Not on file  Social History Narrative  . Not on file    Review of Systems  Constitutional: Negative.   HENT: Negative.   Eyes: Negative.   Respiratory: Negative.   Cardiovascular: Negative.   Gastrointestinal: Negative.   Endocrine: Negative.   Genitourinary: Positive for frequency.       Bladder pain Vaginal dryness Vaginal irritation  Musculoskeletal: Negative.   Skin: Negative.   Allergic/Immunologic: Negative.   Neurological: Negative.   Hematological: Negative.   Psychiatric/Behavioral: Negative.     PHYSICAL EXAMINATION:    BP (!) 110/58 (BP Location: Right Arm, Patient Position: Sitting, Cuff Size: Normal)   Pulse 72   Temp 97.8 F (36.6 C) (Temporal)   Resp 12   Ht 5' 2.25" (1.581 m)   Wt 146 lb (66.2 kg)   BMI 26.49 kg/m     General appearance: alert, cooperative and appears stated age  Pelvic: External genitalia:  no lesions              Urethra:  normal appearing urethra with no masses, tenderness or lesions              Bartholins and Skenes: normal                 Vagina: normal appearing vagina with normal color and discharge, no lesions.                          Second degree cystocele and second degree rectocele.              Cervix: no lesions.  IUD string noted.                 Bimanual Exam:  Uterus:  normal size, contour, position, consistency, mobility, non-tender              Adnexa: no mass, fullness, tenderness              Rectal exam: Yes.  .  Confirms.              Anus:  normal sphincter tone, no  lesions  Chaperone was present for exam.  ASSESSMENT  Recent treatment for PID.  Second degree cystocele and rectocele. Stress incontinence and overactive bladder. Mirena IUD.  Cervical dysplasia. Vaginal irritation.   PLAN  We discussed pelvic organ prolapse and urinary incontinence.    We reviewed potential treatment options of pessary, pelvic floor PT, and surgical correction.  ACOG HO on incontinence and prolapse and surgical treatment for these conditions also. She needs to complete her LEEP first. . Will do an Affirm today to rule out vaginitis.  Ok to restart her medication for overactive bladder.   An After Visit Summary was printed and given to the patient.  __15____ minutes face to face time of which over 50% was spent in counseling.

## 2018-08-03 LAB — VAGINITIS/VAGINOSIS, DNA PROBE
Candida Species: NEGATIVE
Gardnerella vaginalis: NEGATIVE
Trichomonas vaginosis: NEGATIVE

## 2018-08-04 DIAGNOSIS — N816 Rectocele: Secondary | ICD-10-CM | POA: Insufficient documentation

## 2018-08-04 DIAGNOSIS — N8110 Cystocele, unspecified: Secondary | ICD-10-CM | POA: Insufficient documentation

## 2018-08-15 ENCOUNTER — Other Ambulatory Visit (HOSPITAL_COMMUNITY)
Admission: RE | Admit: 2018-08-15 | Discharge: 2018-08-15 | Disposition: A | Discharge status: Home / Self Care | Payer: BC Managed Care – PPO | Source: Ambulatory Visit | Attending: Obstetrics and Gynecology | Admitting: Obstetrics and Gynecology

## 2018-08-15 ENCOUNTER — Encounter: Payer: Self-pay | Admitting: Obstetrics and Gynecology

## 2018-08-15 ENCOUNTER — Telehealth: Payer: Self-pay | Admitting: Obstetrics and Gynecology

## 2018-08-15 ENCOUNTER — Other Ambulatory Visit: Payer: Self-pay

## 2018-08-15 ENCOUNTER — Ambulatory Visit (INDEPENDENT_AMBULATORY_CARE_PROVIDER_SITE_OTHER): Payer: BC Managed Care – PPO | Admitting: Obstetrics and Gynecology

## 2018-08-15 VITALS — BP 102/68 | HR 84 | Temp 97.3°F | Resp 12 | Ht 62.25 in | Wt 145.0 lb

## 2018-08-15 DIAGNOSIS — R5381 Other malaise: Secondary | ICD-10-CM

## 2018-08-15 DIAGNOSIS — N898 Other specified noninflammatory disorders of vagina: Secondary | ICD-10-CM | POA: Diagnosis not present

## 2018-08-15 DIAGNOSIS — R102 Pelvic and perineal pain unspecified side: Secondary | ICD-10-CM

## 2018-08-15 DIAGNOSIS — Z3009 Encounter for other general counseling and advice on contraception: Secondary | ICD-10-CM

## 2018-08-15 LAB — POCT URINALYSIS DIPSTICK
Bilirubin, UA: NEGATIVE
Blood, UA: NEGATIVE
Glucose, UA: NEGATIVE
Ketones, UA: NEGATIVE
Leukocytes, UA: NEGATIVE
Nitrite, UA: NEGATIVE
Protein, UA: NEGATIVE
Urobilinogen, UA: 0.2 E.U./dL
pH, UA: 5 (ref 5.0–8.0)

## 2018-08-15 MED ORDER — IBUPROFEN 800 MG PO TABS: 800.0000 mg | ORAL_TABLET | Freq: Three times a day (TID) | ORAL | 0 refills | Status: DC | PRN

## 2018-08-15 NOTE — Telephone Encounter (Signed)
Spoke with patient.   1. Patient reports constant cramping 7/10 and pelvic tenderness. Spotting and brown/yellow, watery vaginal d/c, no odor. Pain not relieved with OTC Motrin. Denies N/V, fever/chills.   2. Has IUD in place, asking if this can be removed at LEEP procedure on 7/6?   3. Reports intermittent "restlessness and warming sensation" in left lower extremity. Started 4-5 days ago. Denies pain with ambulation, redness or swelling. Takes POP, last dose approximately 1 wk ago.   Advised OV needed for further evaluation, Dr. Quincy Simmonds can then advise on IUD removal at that time. OV scheduled for today at 1:30pm with Dr. Quincy Simmonds. Advised I will review with Dr. Quincy Simmonds and return call if any additional recommendations. Patient agreeable.   Routing to provider for final review. Patient is agreeable to disposition. Will close encounter.

## 2018-08-15 NOTE — Telephone Encounter (Signed)
Patient has questions before procedure on Monday.

## 2018-08-15 NOTE — Progress Notes (Signed)
GYNECOLOGY  VISIT   HPI: 33 y.o.   Single  African American  female   [phone] with Toni Warren's last menstrual period was 08/08/2018.   here for pelvic pain, yellow-watery discharge, frequency, urgency, pain with intercourse. She wants to know that it is ok to proceed with her upcoming LEEP next week.   States she is having lower abdomnal pain starting 5 days ago.  Feeling pressure, tenderness, and pain with urination.  Ibuprofen or Goody powder can help but she gets relief for only one day. Taking  2 - 3 per day.  Gets some relief with voiding.  She developed vaginal discharge 3 days ago that is slippery and yellow.   Negative vaginitis testing and negative urine dip on 08/01/18.   Sex is painful.   Pap on 06/27/18 LGSIL and negative GC/CT. She had colposcopy 06/19/18 showing HGSIL and she is awaiting a LEEP.  She had post procedure pain and she was treated for presumed PID.  She has a hx of recurrent BV.  Some vaginal bleeding last week, light pinkish brown.  Lasted 3 - 4 days.  She stopped the supplemental Micronor that she was taking for bleeding control, around the time of her last visit.  Does not want any more hormones because she feels moody.   Unremarkable pelvic US Jan, 2020.  BMs are daily.  Can be multiple times a day.  Some diarrhea 3 days ago, which she attributed to juice she drank.  Some nausea, which was short lived.  No vomiting.   No fevers.   She also reports warm hot feeling on the top of her left foot about 4 days ago. Sensation comes and goes.  She states her left leg feels heavy and restless.  No swelling or tightness.   Reports not feeling well and wants vit B12 level checked.   Urine dip:  Negative.  UPT:  Negative.   GYNECOLOGIC HISTORY: Toni Warren's last menstrual period was 08/08/2018. Contraception:  IUD Menopausal hormone therapy:  n/a Last mammogram:  n/a Last pap smear:   06/19/18 Colpo showed high grade dysplasia; 05/26/18 LGSIL HPV HR+    OB History    Gravida  3   Para  2   Term  2   Preterm  0   AB  1   Living  2     SAB  0   TAB  1   Ectopic  0   Multiple  0   Live Births  2              Toni Warren Active Problem List   Diagnosis Date Noted  . Female bladder prolapse 08/04/2018  . Rectocele 08/04/2018  . GERD (gastroesophageal reflux disease) 03/09/2014  . Altered bowel habits 03/09/2014  . Bloating 03/09/2014  . Helicobacter pylori ab+ 03/09/2014  . 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 Menorrhagia 03/29/2013  . Encounter for counseling 03/29/2013  . Abdominal hernia 09/29/2011  . 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 Abdominal hernia   . Abnormal Pap smear of cervix 2005   HPV +, 05-26-2018 LGSIL HPV HR+  . Anemia   . Anxiety   . Chlamydia 08/18/2015   repeat testing negative October 2017.  Marland Kitchen Condyloma acuminatum of vulva   . Depression    but doesn't require meds  . GERD (gastroesophageal reflux disease)    only takes OTC meds  . History of bronchitis 3+yrs ago  . History of migraine  09/20/11-last one  . HSV-1 (herpes simplex virus 1) infection   . HSV-2 (herpes simplex virus 2) infection   . Hyperlipidemia    postpartum 2+yrs ago  . Hypertension    postpartum 2+yrs ago  . Low vitamin D level 2018  . Migraine    with aura  . Sickle cell trait ([place])   . Trichomonas infection 09/2016    Past Surgical History:  Procedure Laterality Date  . ABDOMINOPLASTY  09/29/2011   Procedure: ABDOMINOPLASTY;  Surgeon: Theodoro Kos, DO;  Location: [place];  Service: Plastics;  Laterality: N/A;  ABDOMINOPLASTY FOR REPAIR OF RECTUS DIASTASIS  . EYE SURGERY  1990's  . INTRAUTERINE DEVICE (IUD) INSERTION     10-31-13 mirena iud inserted  . OVARIAN CYST REMOVAL  2012  . VENTRAL HERNIA REPAIR  09/29/2011   Procedure: HERNIA REPAIR VENTRAL ADULT;  Surgeon: Joyice Faster. Cornett, MD;  Location: [facility];  Service: General;  Laterality: N/A;    Current  Outpatient Medications  Medication Sig Dispense Refill  . acetaminophen (TYLENOL) 500 MG tablet Take 1,000 mg by mouth every 6 (six) hours as needed. For pain    . aspirin-acetaminophen-caffeine (EXCEDRIN MIGRAINE) 250-250-65 MG per tablet Take 1 tablet by mouth daily as needed. For migraines    . fluconazole (DIFLUCAN) 150 MG tablet     . fluticasone (FLONASE) 50 MCG/ACT nasal spray as needed.  0  . levonorgestrel (MIRENA) 20 MCG/24HR IUD 1 each by Intrauterine route once.    . metroNIDAZOLE (METROGEL) 0.75 % vaginal gel Place applicator pv at hs twice weekly for 6 months. 70 g 6  . NONFORMULARY OR COMPOUNDED ITEM Boric acid suppositories 600 mg. Place 1 suppository vaginally every week.  Dispense:  4, RF 2. 4 each 2  . omeprazole (PRILOSEC) 40 MG capsule Take 1 capsule by mouth as needed.  0  . ondansetron (ZOFRAN-ODT) 4 MG disintegrating tablet Take 1 tablet by mouth as needed.  0  . traZODone (DESYREL) 50 MG tablet TK 1 T PO QD HS PRN    . norethindrone (MICRONOR,CAMILA,ERRIN) 0.35 MG tablet Take 1 tablet (0.35 mg total) by mouth daily. (Toni Warren not taking: Reported on 08/15/2018) 3 Package 1   No current facility-administered medications for this visit.      ALLERGIES: Other, Percocet [oxycodone-acetaminophen], and Valtrex [valacyclovir hcl]  Family History  Problem Relation Age of Onset  . Hypertension Father     Social History   Socioeconomic History  . Marital status: Single    Spouse name: Not on file  . Number of children: Not on file  . Years of education: Not on file  . Highest education level: Not on file  Occupational History  . Not on file  Social Needs  . Financial resource strain: Not on file  . Food insecurity    Worry: Not on file    Inability: Not on file  . Transportation needs    Medical: Not on file    Non-medical: Not on file  Tobacco Use  . Smoking status: Former Smoker    Types: Cigars  . Smokeless tobacco: Never Used  Substance and Sexual  Activity  . Alcohol use: Not Currently    Alcohol/week: 0.0 standard drinks  . Drug use: No  . Sexual activity: Yes    Partners: Male    Birth control/protection: I.U.D.    Comment: Mirena IUD inserted 10-31-13  Lifestyle  . Physical activity    Days per week: Not on file    Minutes  per session: Not on file  . Stress: Not on file  Relationships  . Social Herbalist on phone: Not on file    Gets together: Not on file    Attends religious service: Not on file    Active member of club or organization: Not on file    Attends meetings of clubs or organizations: Not on file    Relationship status: Not on file  . Intimate partner violence    Fear of current or ex partner: Not on file    Emotionally abused: Not on file    Physically abused: Not on file    Forced sexual activity: Not on file  Other Topics Concern  . Not on file  Social History Narrative  . Not on file    Review of Systems  Constitutional: Negative.   HENT: Negative.   Eyes: Negative.   Respiratory: Negative.   Cardiovascular: Negative.   Gastrointestinal: Negative.   Endocrine: Negative.   Genitourinary: Positive for frequency, pelvic pain, urgency and vaginal discharge.  Musculoskeletal: Negative.   Skin: Negative.   Allergic/Immunologic: Negative.   Neurological: Negative.   Hematological: Negative.   Psychiatric/Behavioral: Negative.     PHYSICAL EXAMINATION:    BP 102/68 (BP Location: Right Arm, Toni Warren Position: Sitting, Cuff Size: Normal)   Pulse 84   Temp (!) 97.3 F (36.3 C) (Temporal)   Resp 12   Ht 5' 2.25" (1.581 m)   Wt 145 lb (65.8 kg)   LMP 08/08/2018   BMI 26.31 kg/m     General appearance: alert, cooperative and appears stated age  Pelvic: External genitalia:  no lesions              Urethra:  normal appearing urethra with no masses, tenderness or lesions              Bartholins and Skenes: normal                 Vagina: normal appearing vagina with normal color and  discharge, no lesions              Cervix: no lesions.  +/- CMT.                Bimanual Exam:  Uterus:  normal size, contour, position, consistency, mobility, mildly tender              Adnexa: no mass, fullness, mild tenderness bilaterally.               Chaperone was present for exam.  ASSESSMENT  Pelvic pain.  Uncertain etiology. Mirena IUD. Vaginal discharge. Malaise.  Recent treatment for PID.  Negative GC/CT.  Second degree cystocele and rectocele. Hx stress incontinence and overactive bladder. Cervical dysplasia. Left foot paresthesia.   PLAN   Vit B12 check.  CBC with diff.  Affirm and GC/CT. Motrin 800 mg q 8 hours prn pain.   She will eat food first.  We discussed gastritis with regular use. Return for LEEP next week.  Will do IUD removal next week as Wants to do a trial of not taking hormonal contraception.  We did discuss causes of increased pelvic pain such as stopping the Micronor and a possible dx of endometriosis.  We reviewed treatments of pain including Depo Provera, Edward Qualia, Depo Lupron, or surgical care which may include hysterectomy combined with pelvic prolapse/incontinence surgery. She will follow up with her PCP if her foot paresthesia persists.   An After Visit Summary was  printed and given to the Toni Warren.  __30____ minutes face to face time of which over 50% was spent in counseling.

## 2018-08-16 ENCOUNTER — Telehealth: Payer: Self-pay | Admitting: Obstetrics and Gynecology

## 2018-08-16 ENCOUNTER — Other Ambulatory Visit: Payer: Self-pay | Admitting: *Deleted

## 2018-08-16 HISTORY — PX: LEEP: SHX91

## 2018-08-16 LAB — VAGINITIS/VAGINOSIS, DNA PROBE
Candida Species: NEGATIVE
Gardnerella vaginalis: POSITIVE — AB
Trichomonas vaginosis: NEGATIVE

## 2018-08-16 LAB — CERVICOVAGINAL ANCILLARY ONLY
Chlamydia: NEGATIVE
Neisseria Gonorrhea: NEGATIVE

## 2018-08-16 MED ORDER — TINIDAZOLE 500 MG PO TABS: 1000.0000 mg | ORAL_TABLET | Freq: Every day | ORAL | 0 refills | Status: AC

## 2018-08-16 NOTE — Telephone Encounter (Signed)
Spoke with patient and conveyed benefits for iud removal. Patient understands/agreeable with benefits.

## 2018-08-16 NOTE — Telephone Encounter (Signed)
Per Gay Filler, we will leave patient scheduled on 08/21/18 for LEEP at 10:30am.  Dr.Silva should be back from surgery.

## 2018-08-16 NOTE — Telephone Encounter (Signed)
Patient has an appointment Monday 7/6 that needs to be rescheduled because Dr Quincy Simmonds will be in surgery. Routing to triage to reschedule procedure. Thank you.

## 2018-08-17 ENCOUNTER — Other Ambulatory Visit: Payer: Self-pay

## 2018-08-17 LAB — CBC WITH DIFFERENTIAL/PLATELET
Basophils Absolute: 0.1 10*3/uL (ref 0.0–0.2)
Basos: 1 %
EOS (ABSOLUTE): 0.1 10*3/uL (ref 0.0–0.4)
Eos: 2 %
Hematocrit: 40.7 % (ref 34.0–46.6)
Hemoglobin: 13.5 g/dL (ref 11.1–15.9)
Immature Grans (Abs): 0 10*3/uL (ref 0.0–0.1)
Immature Granulocytes: 0 %
Lymphocytes Absolute: 1.9 10*3/uL (ref 0.7–3.1)
Lymphs: 38 %
MCH: 29.2 pg (ref 26.6–33.0)
MCHC: 33.2 g/dL (ref 31.5–35.7)
MCV: 88 fL (ref 79–97)
Monocytes Absolute: 0.4 10*3/uL (ref 0.1–0.9)
Monocytes: 8 %
Neutrophils Absolute: 2.5 10*3/uL (ref 1.4–7.0)
Neutrophils: 51 %
Platelets: 343 10*3/uL (ref 150–450)
RBC: 4.62 x10E6/uL (ref 3.77–5.28)
RDW: 13.4 % (ref 11.7–15.4)
WBC: 5 10*3/uL (ref 3.4–10.8)

## 2018-08-17 LAB — VITAMIN B12: Vitamin B-12: 530 pg/mL (ref 232–1245)

## 2018-08-21 ENCOUNTER — Ambulatory Visit: Payer: BC Managed Care – PPO | Admitting: Obstetrics and Gynecology

## 2018-08-21 ENCOUNTER — Encounter: Payer: Self-pay | Admitting: Obstetrics and Gynecology

## 2018-08-21 ENCOUNTER — Other Ambulatory Visit: Payer: Self-pay

## 2018-08-21 ENCOUNTER — Telehealth: Payer: Self-pay | Admitting: Obstetrics and Gynecology

## 2018-08-21 VITALS — BP 118/80 | HR 84 | Temp 98.2°F | Wt 145.6 lb

## 2018-08-21 DIAGNOSIS — R87613 High grade squamous intraepithelial lesion on cytologic smear of cervix (HGSIL): Secondary | ICD-10-CM | POA: Diagnosis not present

## 2018-08-21 DIAGNOSIS — Z3009 Encounter for other general counseling and advice on contraception: Secondary | ICD-10-CM

## 2018-08-21 DIAGNOSIS — Z01812 Encounter for preprocedural laboratory examination: Secondary | ICD-10-CM | POA: Diagnosis not present

## 2018-08-21 LAB — POCT URINE PREGNANCY: Preg Test, Ur: NEGATIVE

## 2018-08-21 NOTE — Progress Notes (Signed)
  Subjective:     Patient ID: Toni Warren, female   DOB: 1985-04-27, 33 y.o.   MRN: [id number]  HPI  Pap History: 06/27/18 LSIL 06/19/18 Colpo showed high grade dysplasia  05/26/18 LGSIL HPV HR+ 08-15-15  Neg:Neg HR HPV  States her pelvic pain is mild today.  She took Tindmax for recent BV.  She thinks this really helped her pain.  She has recurrent BV.  She wants her IUD removed.   Review of Systems  LMP: lmp unknown due to Mirena IUD  Contraception: IUD UPT: negative Patient has not taken any Ibuprofen      Objective:   Physical Exam  LEEP procedure with colposcopy   Consent performed for LEEP procedure and IUD removal.  Insulated speculum placed.   IUD strings noted and IUD removed with ring forceps.  IUD intact, shown to patient and discarded.  3% acetic acid applied and colposcopy performed.  Satisfactory.  Patulous cervix with mild generalized acetowhite changes. Lugol's solution applied.  1% lidocaine with epinephrine 1:100,000 injected circumferentially around the cervix.  LEEP performed with setting of 60 watts cutting.  2 passes taken, one of superior cervix and one of inferior cervix, due to the large size of the transformation zone.  Each marked separately at 12:00 and 6:00 positions.  Separate pass taken of the endocervix using separate smaller loop with 60 watts cutting.  Marked at 12:00 position.  ECC performed with Kevorkian curette.  All tissue specimens sent to pathology.  Coagulation of the periphery of the LEEP performed.  Good hemostasis.  Minimal EBL. No complications.     Assessment:     HGSIL. Hx pelvic pain.  Hx recurrent vaginitis.  Mirena IUD.  Removed today.     Plan:       FU biopsy results. We discussed protocols for positive margins on LEEP procedure.  Observation of recurrent BV and pelvic pain.  Patient and I have discussed possibility of hysterectomy.

## 2018-08-21 NOTE — Patient Instructions (Signed)
Loop Electrosurgical Excision Procedure Loop electrosurgical excision procedure (LEEP) is the cutting and removal (excision) of tissue from the cervix. The cervix is the bottom part of the uterus that opens into the vagina. The tissue that is removed from the cervix is examined to see if there are precancerous cells or cancer cells present. LEEP may be done when:  You have abnormal bleeding from your cervix.  You have an abnormal Pap test result.  Your health care provider finds an abnormality on your cervix during a pelvic exam. LEEP typically only takes a few minutes and is often done in the health care provider's office. The procedure is safe for women who are trying to get pregnant. However, the procedure is usually not done during a menstrual period or during pregnancy. Tell a health care provider about:  Any allergies you have.  All medicines you are taking, including vitamins, herbs, eye drops, creams, and over-the-counter medicines.  Any blood disorders you have.  Any medical conditions you have, including current or past vaginal infections such as herpes or sexually-transmitted infections (STIs).  Whether you are pregnant or may be pregnant.  Whether or not you are having vaginal bleeding on the day of the procedure. What are the risks? Generally, this is a safe procedure. However, problems may occur, including:  Infection.  Bleeding.  Allergic reactions to medicines.  Changes or scarring in the cervix.  Increased risk of early (preterm) labor in future pregnancies. What happens before the procedure?  Ask your health care provider about: ? Changing or stopping your regular medicines. This is especially important if you are taking diabetes medicines or blood thinners. ? Taking medicines such as aspirin and ibuprofen. These medicines can thin your blood. Do not take these medicines unless your health care provider tells you to take them. ? Taking over-the-counter  medicines, vitamins, herbs, and supplements.  Your health care provider may recommend that you take pain medicine before the procedure.  Ask your health care provider if you should plan to have someone take you home after the procedure. What happens during the procedure?   An instrument called a speculum will be placed in your vagina. This will allow your health care provider to see your cervix.  You will be given a medicine to numb the area (local anesthetic). The medicine will be injected into your cervix and the surrounding area.  A solution will be applied to your cervix. This solution will help the health care provider find the abnormal cells that need to be removed.  A thin wire loop will be passed through your vagina. The wire will be used to burn (cauterize) the cervical tissue with an electrical current.  You may feel faint during the procedure. Tell your health care provider right away if you feel this way.  The abnormal cervical tissue will be removed.  Any open blood vessels will be cauterized to prevent bleeding.  A paste may be applied to the cauterized area of your cervix to help prevent bleeding.  The sample of cervical tissue will be examined under a microscope. The procedure may vary among health care providers and hospitals. What can I expect after the procedure? After the procedure, it is common to have:  Mild abdominal cramps that are similar to menstrual cramps. These may last for up to 1 week.  A small amount of pink-tinged or bloody vaginal discharge, including light to moderate bleeding, for 1-2 weeks.  A dark-colored discharge coming from your vagina. This is from   the paste that was used on the cervix to prevent bleeding. It is up to you to get the results of your procedure. Ask your health care provider, or the department that is doing the procedure, when your results will be ready. Follow these instructions at home:  Take over-the-counter and  prescription medicines only as told by your health care provider.  Return to your normal activities as told by your health care provider. Ask your health care provider what activities are safe for you.  Do not put anything in your vagina for 2 weeks after the procedure or until your health care provider says that it is okay. This includes tampons, creams, and douches.  Do not have sex until your health care provider approves.  Keep all follow-up visits as told by your health care provider. This is important. Contact a health care provider if you:  Have a fever or chills.  Feel unusually weak.  Have vaginal bleeding that is heavier or longer than a normal menstrual cycle. A sign of this can be soaking a pad with blood or bleeding with clots.  Develop a bad smelling vaginal discharge.  Have severe abdominal pain or cramping. Summary  Loop electrosurgical excision procedure (LEEP) is the removal of tissue from the cervix. The removed tissue will be checked for precancerous cells or cancer cells.  LEEP typically only takes a few minutes and is often done in the health care provider's office.  Do not put anything in your vagina for 2 weeks after the procedure or until your health care provider says that it is okay. This includes tampons, creams, and douches.  Keep all follow-up visits as told by your health care provider. Ask your health care provider, or the department that is doing the procedure, when your results will be ready. This information is not intended to replace advice given to you by your health care provider. Make sure you discuss any questions you have with your health care provider. Document Released: 04/24/2002 Document Revised: 02/24/2018 Document Reviewed: 02/24/2018 Elsevier Patient Education  2020 [employer].

## 2018-08-21 NOTE — Telephone Encounter (Signed)
Left message for patient to call and schedule her 4 week post LEEP follow up appointment with Dr.Silva.

## 2018-08-25 ENCOUNTER — Encounter: Payer: Self-pay | Admitting: Obstetrics and Gynecology

## 2018-08-28 ENCOUNTER — Telehealth: Payer: Self-pay | Admitting: *Deleted

## 2018-08-28 NOTE — Telephone Encounter (Signed)
Spoke with patient regarding results, s/p LEEP on 08/21/18 and IUD removal.   Patient reports bleeding since IUD removed. Changing pad 3-4 times per day. Reports vaginal odor that is stronger in the morning, is unsure if odor is "fishy or just off due to bleeding and procedure". Was tx for BV prior to LEEP. Denies pelvic pain, fever/chills. Advised I will with Dr. Quincy Simmonds and return call, patient agreeable.   Dr. Quincy Simmonds -please review and advise.

## 2018-08-28 NOTE — Telephone Encounter (Signed)
Spoke with patient, advised as seen below per Dr. Quincy Simmonds. OV scheduled for 7/14 at 11:30am with Dr. Quincy Simmonds. Patient declines OV for today.   Routing to provider for final review. Patient is agreeable to disposition. Will close encounter.

## 2018-08-28 NOTE — Telephone Encounter (Signed)
Patient will need an office visit with me. to evaluate for possible bacterial vaginosis.  It is hard to determine by phone if the odor is from blood or BV.

## 2018-08-28 NOTE — Telephone Encounter (Addendum)
Notes recorded by Toni Logan, RN on 08/28/2018 at 11:19 AM EDT  Spoke with patient, advised as seen below per Dr. Quincy Warren. Patient states she would like to proceed with hysterectomy, will plan to further discuss at f/u on 09/14/18 with Dr. Quincy Warren. See telephone encounter dated 7/13/120 to review additional concerns with provider.  ------    ----- Message from Toni Cobbs, MD sent at 08/25/2018  3:32 PM EDT ----- Please report LEEP pathology report to patient.  She has high grade dysplasia, CIN II, with positive margins.  Her ECC was benign.  There is no cancer noted.  The positive margins does not surprise me because of her anatomy.  Her cervix has a wide diameter so the treatment area was also wide.  Options for her care will be to do a re-excision, pap and HR HPV in 4 months, or hysterectomy.  She needs to keep her one month recheck appointment with me.

## 2018-08-29 ENCOUNTER — Ambulatory Visit: Payer: Self-pay | Admitting: Obstetrics and Gynecology

## 2018-08-29 NOTE — Telephone Encounter (Signed)
Please contact patient in follow up to her concern about vaginal odor status post LEEP.  If she is having continued symptoms, I would like her to come for an office visit.  She cancelled her recheck due to an issue with her child, so I hope everything is ok.

## 2018-08-29 NOTE — Progress Notes (Deleted)
GYNECOLOGY  VISIT   HPI: 33 y.o.   Single  African American  female   [phone] with Patient's last menstrual period was 08/08/2018.   here for vaginal odor    GYNECOLOGIC HISTORY: Patient's last menstrual period was 08/08/2018. Contraception:  *** Menopausal hormone therapy:  n/a Last mammogram:  n/a Last pap smear: 08/21/18 LEEP showed high grade dysplasia, CIN II, with positive margins, ECC benign; 06/19/18 Colpo showed high grade dysplasia; 05/26/18 LGSIL HPV HR+        OB History    Gravida  3   Para  2   Term  2   Preterm  0   AB  1   Living  2     SAB  0   TAB  1   Ectopic  0   Multiple  0   Live Births  2              Patient Active Problem List   Diagnosis Date Noted  . Female bladder prolapse 08/04/2018  . Rectocele 08/04/2018  . GERD (gastroesophageal reflux disease) 03/09/2014  . Altered bowel habits 03/09/2014  . Bloating 03/09/2014  . Helicobacter pylori ab+ 03/09/2014  . 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 Menorrhagia 03/29/2013  . Encounter for counseling 03/29/2013  . Abdominal hernia 09/29/2011  . 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 Abdominal hernia   . Abnormal Pap smear of cervix 2005   HPV +, 05-26-2018 LGSIL HPV HR+  . Anemia   . Anxiety   . Chlamydia 08/18/2015   repeat testing negative October 2017.  Marland Kitchen Condyloma acuminatum of vulva   . Depression    but doesn't require meds  . GERD (gastroesophageal reflux disease)    only takes OTC meds  . History of bronchitis 3+yrs ago  . History of migraine 09/20/11-last one  . HSV-1 (herpes simplex virus 1) infection   . HSV-2 (herpes simplex virus 2) infection   . Hyperlipidemia    postpartum 2+yrs ago  . Hypertension    postpartum 2+yrs ago  . Low vitamin D level 2018  . Migraine    with aura  . Sickle cell trait ([place])   . Trichomonas infection 09/2016    Past Surgical History:  Procedure Laterality Date  . ABDOMINOPLASTY   09/29/2011   Procedure: ABDOMINOPLASTY;  Surgeon: Theodoro Kos, DO;  Location: [facility];  Service: Plastics;  Laterality: N/A;  ABDOMINOPLASTY FOR REPAIR OF RECTUS DIASTASIS  . EYE SURGERY  1990's  . INTRAUTERINE DEVICE (IUD) INSERTION     10-31-13 mirena iud inserted  . 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   . OVARIAN CYST REMOVAL  2012  . VENTRAL HERNIA REPAIR  09/29/2011   Procedure: HERNIA REPAIR VENTRAL ADULT;  Surgeon: Joyice Faster. Cornett, MD;  Location: [facility];  Service: General;  Laterality: N/A;    Current Outpatient Medications  Medication Sig Dispense Refill  . acetaminophen (TYLENOL) 500 MG tablet Take 1,000 mg by mouth every 6 (six) hours as needed. For pain    . aspirin-acetaminophen-caffeine (EXCEDRIN MIGRAINE) 250-250-65 MG per tablet Take 1 tablet by mouth daily as needed. For migraines    . fluconazole (DIFLUCAN) 150 MG tablet     . fluticasone (FLONASE) 50 MCG/ACT nasal spray as needed.  0  . ibuprofen (ADVIL) 800 MG tablet Take 1 tablet (800 mg total) by mouth every 8 (eight) hours as needed. 30 tablet 0  .  levonorgestrel (MIRENA) 20 MCG/24HR IUD 1 each by Intrauterine route once.    . metroNIDAZOLE (METROGEL) 0.75 % vaginal gel Place applicator pv at hs twice weekly for 6 months. 70 g 6  . NONFORMULARY OR COMPOUNDED ITEM Boric acid suppositories 600 mg. Place 1 suppository vaginally every week.  Dispense:  4, RF 2. 4 each 2  . omeprazole (PRILOSEC) 40 MG capsule Take 1 capsule by mouth as needed.  0  . ondansetron (ZOFRAN-ODT) 4 MG disintegrating tablet Take 1 tablet by mouth as needed.  0  . traZODone (DESYREL) 50 MG tablet TK 1 T PO QD HS PRN     No current facility-administered medications for this visit.      ALLERGIES: Other, Percocet [oxycodone-acetaminophen], and Valtrex [valacyclovir hcl]  Family History  Problem Relation Age of Onset  . Hypertension Father     Social History   Socioeconomic History  . Marital status: Single    Spouse name: Not on  file  . Number of children: Not on file  . Years of education: Not on file  . Highest education level: Not on file  Occupational History  . Not on file  Social Needs  . Financial resource strain: Not on file  . Food insecurity    Worry: Not on file    Inability: Not on file  . Transportation needs    Medical: Not on file    Non-medical: Not on file  Tobacco Use  . Smoking status: Former Smoker    Types: Cigars  . Smokeless tobacco: Never Used  Substance and Sexual Activity  . Alcohol use: Not Currently    Alcohol/week: 0.0 standard drinks  . Drug use: No  . Sexual activity: Yes    Partners: Male    Birth control/protection: I.U.D.    Comment: Mirena IUD inserted 10-31-13  Lifestyle  . Physical activity    Days per week: Not on file    Minutes per session: Not on file  . Stress: Not on file  Relationships  . Social Herbalist on phone: Not on file    Gets together: Not on file    Attends religious service: Not on file    Active member of club or organization: Not on file    Attends meetings of clubs or organizations: Not on file    Relationship status: Not on file  . Intimate partner violence    Fear of current or ex partner: Not on file    Emotionally abused: Not on file    Physically abused: Not on file    Forced sexual activity: Not on file  Other Topics Concern  . Not on file  Social History Narrative  . Not on file    Review of Systems  PHYSICAL EXAMINATION:    LMP 08/08/2018     General appearance: alert, cooperative and appears stated age Head: Normocephalic, without obvious abnormality, atraumatic Neck: no adenopathy, supple, symmetrical, trachea midline and thyroid normal to inspection and palpation Lungs: clear to auscultation bilaterally Breasts: normal appearance, no masses or tenderness, No nipple retraction or dimpling, No nipple discharge or bleeding, No axillary or supraclavicular adenopathy Heart: regular rate and rhythm Abdomen:  soft, non-tender, no masses,  no organomegaly Extremities: extremities normal, atraumatic, no cyanosis or edema Skin: Skin color, texture, turgor normal. No rashes or lesions Lymph nodes: Cervical, supraclavicular, and axillary nodes normal. No abnormal inguinal nodes palpated Neurologic: Grossly normal  Pelvic: External genitalia:  no lesions  Urethra:  normal appearing urethra with no masses, tenderness or lesions              Bartholins and Skenes: normal                 Vagina: normal appearing vagina with normal color and discharge, no lesions              Cervix: no lesions                Bimanual Exam:  Uterus:  normal size, contour, position, consistency, mobility, non-tender              Adnexa: no mass, fullness, tenderness              Rectal exam: {yes no:314532}.  Confirms.              Anus:  normal sphincter tone, no lesions  Chaperone was present for exam.  ASSESSMENT     PLAN     An After Visit Summary was printed and given to the patient.  ______ minutes face to face time of which over 50% was spent in counseling.

## 2018-08-29 NOTE — Telephone Encounter (Signed)
Spoke with patient. OV rescheduled for 7/16 at 1:30pm with Dr. Quincy Simmonds. Patient is agreeable to date and time.   Patient is scheduled for 1 mo LEEP f/u on 09/14/18.   Routing to provider for final review. Patient is agreeable to disposition. Will close encounter.

## 2018-08-29 NOTE — Telephone Encounter (Signed)
Patient canceled her vaginal odor appointment due to her child injured her leg. She will call later to reschedule.

## 2018-08-31 ENCOUNTER — Encounter: Payer: Self-pay | Admitting: Obstetrics and Gynecology

## 2018-08-31 ENCOUNTER — Other Ambulatory Visit: Payer: Self-pay

## 2018-08-31 ENCOUNTER — Telehealth: Payer: Self-pay | Admitting: Obstetrics and Gynecology

## 2018-08-31 ENCOUNTER — Ambulatory Visit: Payer: BC Managed Care – PPO | Admitting: Obstetrics and Gynecology

## 2018-08-31 VITALS — BP 126/78 | HR 76 | Temp 97.4°F | Resp 12 | Ht 62.25 in | Wt 151.0 lb

## 2018-08-31 DIAGNOSIS — R87613 High grade squamous intraepithelial lesion on cytologic smear of cervix (HGSIL): Secondary | ICD-10-CM | POA: Diagnosis not present

## 2018-08-31 DIAGNOSIS — N939 Abnormal uterine and vaginal bleeding, unspecified: Secondary | ICD-10-CM

## 2018-08-31 DIAGNOSIS — N898 Other specified noninflammatory disorders of vagina: Secondary | ICD-10-CM

## 2018-08-31 MED ORDER — NORETHINDRONE 0.35 MG PO TABS: 1.0000 | ORAL_TABLET | Freq: Every day | ORAL | 2 refills | Status: DC

## 2018-08-31 NOTE — Telephone Encounter (Signed)
Left message foe patient to call and schedule her 4 month pap and endocervical curettage, recheck .

## 2018-08-31 NOTE — Progress Notes (Signed)
GYNECOLOGY  VISIT   HPI: 33 y.o.   Single  African American  female   [phone] with Patient's last menstrual period was 08/21/2018.   here for vaginal odor and bleeding.  Bleeding is constant.  Changing a pad every 2 hours for freshness.  Some clotting.  Some weakness.    LEEP and Mirena IUD removal done 08/21/18.  HGSIL with positive margins.   Not as moody since the IUD was removed.  Does feel different and better.   Hgb 13.5 on 08/15/18.   States her periods are horrible when she is not on contraception.   Less urinary frequency since Mirena removed.   GYNECOLOGIC HISTORY: Patient's last menstrual period was 08/21/2018. Contraception:  Abstinence Menopausal hormone therapy:  n/a Last mammogram:  n/a Last pap smear: 08/21/18 high grade dysplasia, CIN II, with positive margins, ECC benign; 06/27/18 LSIL;  06/19/18 Colpo showed high grade dysplasia; 05/26/18 LGSIL HPV HR+        OB History    Gravida  3   Para  2   Term  2   Preterm  0   AB  1   Living  2     SAB  0   TAB  1   Ectopic  0   Multiple  0   Live Births  2              Patient Active Problem List   Diagnosis Date Noted  . Female bladder prolapse 08/04/2018  . Rectocele 08/04/2018  . GERD (gastroesophageal reflux disease) 03/09/2014  . Altered bowel habits 03/09/2014  . Bloating 03/09/2014  . Helicobacter pylori ab+ 03/09/2014  . 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 Menorrhagia 03/29/2013  . Encounter for counseling 03/29/2013  . Abdominal hernia 09/29/2011  . 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 Abdominal hernia   . Abnormal Pap smear of cervix 2005   HPV +, 05-26-2018 LGSIL HPV HR+  . Anemia   . Anxiety   . Chlamydia 08/18/2015   repeat testing negative October 2017.  Marland Kitchen Condyloma acuminatum of vulva   . Depression    but doesn't require meds  . GERD (gastroesophageal reflux disease)    only takes OTC meds  . History of bronchitis 3+yrs  ago  . History of migraine 09/20/11-last one  . HSV-1 (herpes simplex virus 1) infection   . HSV-2 (herpes simplex virus 2) infection   . Hyperlipidemia    postpartum 2+yrs ago  . Hypertension    postpartum 2+yrs ago  . Low vitamin D level 2018  . Migraine    with aura  . Sickle cell trait ([place])   . Trichomonas infection 09/2016    Past Surgical History:  Procedure Laterality Date  . ABDOMINOPLASTY  09/29/2011   Procedure: ABDOMINOPLASTY;  Surgeon: Theodoro Kos, DO;  Location: [facility];  Service: Plastics;  Laterality: N/A;  ABDOMINOPLASTY FOR REPAIR OF RECTUS DIASTASIS  . EYE SURGERY  1990's  . INTRAUTERINE DEVICE (IUD) INSERTION     10-31-13 mirena iud inserted  . 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   . OVARIAN CYST REMOVAL  2012  . VENTRAL HERNIA REPAIR  09/29/2011   Procedure: HERNIA REPAIR VENTRAL ADULT;  Surgeon: Joyice Faster. Cornett, MD;  Location: [facility];  Service: General;  Laterality: N/A;    Current Outpatient Medications  Medication Sig Dispense Refill  . acetaminophen (TYLENOL) 500 MG tablet Take 1,000 mg by  mouth every 6 (six) hours as needed. For pain    . aspirin-acetaminophen-caffeine (EXCEDRIN MIGRAINE) 250-250-65 MG per tablet Take 1 tablet by mouth daily as needed. For migraines    . fluticasone (FLONASE) 50 MCG/ACT nasal spray as needed.  0  . ibuprofen (ADVIL) 800 MG tablet Take 1 tablet (800 mg total) by mouth every 8 (eight) hours as needed. 30 tablet 0  . levonorgestrel (MIRENA) 20 MCG/24HR IUD 1 each by Intrauterine route once.    . NONFORMULARY OR COMPOUNDED ITEM Boric acid suppositories 600 mg. Place 1 suppository vaginally every week.  Dispense:  4, RF 2. 4 each 2  . omeprazole (PRILOSEC) 40 MG capsule Take 1 capsule by mouth as needed.  0  . ondansetron (ZOFRAN-ODT) 4 MG disintegrating tablet Take 1 tablet by mouth as needed.  0  . traZODone (DESYREL) 50 MG tablet TK 1 T PO QD HS PRN    . norethindrone (MICRONOR) 0.35 MG tablet Take 1 tablet (0.35 mg  total) by mouth daily. 3 Package 2   No current facility-administered medications for this visit.      ALLERGIES: Other, Percocet [oxycodone-acetaminophen], and Valtrex [valacyclovir hcl]  Family History  Problem Relation Age of Onset  . Hypertension Father     Social History   Socioeconomic History  . Marital status: Single    Spouse name: Not on file  . Number of children: Not on file  . Years of education: Not on file  . Highest education level: Not on file  Occupational History  . Not on file  Social Needs  . Financial resource strain: Not on file  . Food insecurity    Worry: Not on file    Inability: Not on file  . Transportation needs    Medical: Not on file    Non-medical: Not on file  Tobacco Use  . Smoking status: Former Smoker    Types: Cigars  . Smokeless tobacco: Never Used  Substance and Sexual Activity  . Alcohol use: Not Currently    Alcohol/week: 0.0 standard drinks  . Drug use: No  . Sexual activity: Yes    Partners: Male    Birth control/protection: I.U.D.    Comment: Mirena IUD inserted 10-31-13  Lifestyle  . Physical activity    Days per week: Not on file    Minutes per session: Not on file  . Stress: Not on file  Relationships  . Social Herbalist on phone: Not on file    Gets together: Not on file    Attends religious service: Not on file    Active member of club or organization: Not on file    Attends meetings of clubs or organizations: Not on file    Relationship status: Not on file  . Intimate partner violence    Fear of current or ex partner: Not on file    Emotionally abused: Not on file    Physically abused: Not on file    Forced sexual activity: Not on file  Other Topics Concern  . Not on file  Social History Narrative  . Not on file    Review of Systems  Constitutional: Negative.   HENT: Negative.   Eyes: Negative.   Respiratory: Negative.   Cardiovascular: Negative.   Gastrointestinal: Negative.    Endocrine: Negative.   Genitourinary: Positive for vaginal bleeding.       Vaginal odor  Musculoskeletal: Negative.   Skin: Negative.   Allergic/Immunologic: Negative.   Neurological: Negative.  Hematological: Negative.   Psychiatric/Behavioral: Negative.     PHYSICAL EXAMINATION:    BP 126/78 (BP Location: Right Arm, Patient Position: Sitting, Cuff Size: Normal)   Pulse 76   Temp (!) 97.4 F (36.3 C) (Temporal)   Resp 12   Ht 5' 2.25" (1.581 m)   Wt 151 lb (68.5 kg)   LMP 08/21/2018   BMI 27.40 kg/m     General appearance: alert, cooperative and appears stated age    Pelvic: External genitalia:  no lesions              Urethra:  normal appearing urethra with no masses, tenderness or lesions              Bartholins and Skenes: normal                 Vagina: normal appearing vagina with normal color and discharge, no lesions              Cervix:  LEEP bed with small clot noted.  No active bleeding.                 Bimanual Exam:  Uterus:  normal size, contour, position, consistency, mobility, non-tender              Adnexa: no mass, fullness, tenderness            Chaperone was present for exam.  ASSESSMENT  Vaginal bleeding status post LEEP.  HGSIL and positive margin.  Vaginal odor.   PLAN  Affirm done.  We discussed the HGSIL and positive margin and options for re-excision, pap and ECC in 4 months, and hysterectomy.  Will do pap and ECC in 4 months.  Restart Micronor.  FU in about 2 weeks.    An After Visit Summary was printed and given to the patient.  __15____ minutes face to face time of which over 50% was spent in counseling.

## 2018-09-01 ENCOUNTER — Telehealth: Payer: Self-pay | Admitting: *Deleted

## 2018-09-01 LAB — VAGINITIS/VAGINOSIS, DNA PROBE
Candida Species: NEGATIVE
Gardnerella vaginalis: POSITIVE — AB
Trichomonas vaginosis: NEGATIVE

## 2018-09-01 MED ORDER — METRONIDAZOLE 500 MG PO TABS: 500.0000 mg | ORAL_TABLET | Freq: Two times a day (BID) | ORAL | 0 refills | Status: DC

## 2018-09-01 NOTE — Telephone Encounter (Signed)
-----   Message from Nunzio Cobbs, MD sent at 09/01/2018  8:40 AM EDT ----- Please inform of Affirm result showing bacterial vaginosis. This has occurred following her LEEP procedure.  She has recurrent BV. She may treat with Flagyl 500 mg po bid for 7 days. Please send Rx to pharmacy of choice. ETOH precautions.

## 2018-09-01 NOTE — Telephone Encounter (Signed)
Spoke with patient, advised as seen below per Dr. Quincy Simmonds. Patient verbalizes understanding and is agreeable. RX for flagyl to verified pharmacy.  Encounter closed.

## 2018-09-01 NOTE — Telephone Encounter (Signed)
Notes recorded by Burnice Logan, RN on 09/01/2018 at 10:05 AM EDT  Left message to call Sharee Pimple, RN at [facility].   Rx pended.

## 2018-09-08 ENCOUNTER — Other Ambulatory Visit: Payer: Self-pay

## 2018-09-08 DIAGNOSIS — Z20822 Contact with and (suspected) exposure to covid-19: Secondary | ICD-10-CM

## 2018-09-11 LAB — NOVEL CORONAVIRUS, NAA: SARS-CoV-2, NAA: NOT DETECTED

## 2018-09-12 ENCOUNTER — Other Ambulatory Visit: Payer: Self-pay

## 2018-09-14 ENCOUNTER — Ambulatory Visit: Payer: Self-pay | Admitting: Obstetrics and Gynecology

## 2018-09-14 ENCOUNTER — Other Ambulatory Visit: Payer: Self-pay

## 2018-09-14 ENCOUNTER — Encounter: Payer: Self-pay | Admitting: Obstetrics and Gynecology

## 2018-09-14 ENCOUNTER — Ambulatory Visit (INDEPENDENT_AMBULATORY_CARE_PROVIDER_SITE_OTHER): Payer: BC Managed Care – PPO | Admitting: Obstetrics and Gynecology

## 2018-09-14 VITALS — BP 110/70 | HR 76 | Temp 98.0°F | Resp 14 | Ht 62.0 in | Wt 150.0 lb

## 2018-09-14 DIAGNOSIS — Z9889 Other specified postprocedural states: Secondary | ICD-10-CM

## 2018-09-14 NOTE — Progress Notes (Signed)
GYNECOLOGY  VISIT   HPI: 33 y.o.   Single  African American  female   [phone] with Patient's last menstrual period was 08/21/2018.   here for 1 month LEEP f/u     Stopped bleeding 3 days ago.  Thinks she had a menstruation.  Taking Micronor, second week of the pack.  Less urinary frequency since her IUD was removed.  Not having any pelvic pain.  Has improved moisture of the vagina.   Feels like her treatment for the BV has improved.   Satisfied to have had the IUD removed.   GYNECOLOGIC HISTORY: Patient's last menstrual period was 08/21/2018. 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 Female bladder prolapse 08/04/2018  . Rectocele 08/04/2018  . GERD (gastroesophageal reflux disease) 03/09/2014  . Altered bowel habits 03/09/2014  . Bloating 03/09/2014  . Helicobacter pylori ab+ 03/09/2014  . 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 Menorrhagia 03/29/2013  . Encounter for counseling 03/29/2013  . Abdominal hernia 09/29/2011  . 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 Abdominal hernia   . Abnormal Pap smear of cervix 2005   HPV +, 05-26-2018 LGSIL HPV HR+  . Anemia   . Anxiety   . Chlamydia 08/18/2015   repeat testing negative October 2017.  Marland Kitchen Condyloma acuminatum of vulva   . Depression    but doesn't require meds  . GERD (gastroesophageal reflux disease)    only takes OTC meds  . History of bronchitis 3+yrs ago  . History of migraine 09/20/11-last one  . HSV-1 (herpes simplex virus 1)  infection   . HSV-2 (herpes simplex virus 2) infection   . Hyperlipidemia    postpartum 2+yrs ago  . Hypertension    postpartum 2+yrs ago  . Low vitamin D level 2018  . Migraine    with aura  . Sickle cell trait ([place])   . Trichomonas infection 09/2016    Past Surgical History:  Procedure Laterality Date  . ABDOMINOPLASTY  09/29/2011   Procedure: ABDOMINOPLASTY;  Surgeon: Theodoro Kos, DO;  Location: [facility];  Service: Plastics;  Laterality: N/A;  ABDOMINOPLASTY FOR REPAIR OF RECTUS DIASTASIS  . EYE SURGERY  1990's  . INTRAUTERINE DEVICE (IUD) INSERTION     10-31-13 mirena iud inserted  . 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   . OVARIAN CYST REMOVAL  2012  . VENTRAL HERNIA REPAIR  09/29/2011   Procedure: HERNIA REPAIR VENTRAL ADULT;  Surgeon: Joyice Faster. Cornett, MD;  Location: [facility];  Service: General;  Laterality: N/A;    Current Outpatient Medications  Medication Sig Dispense Refill  . acetaminophen (TYLENOL) 500 MG tablet Take 1,000 mg by mouth every 6 (six) hours as needed. For pain    .  aspirin-acetaminophen-caffeine (EXCEDRIN MIGRAINE) 250-250-65 MG per tablet Take 1 tablet by mouth daily as needed. For migraines    . fluticasone (FLONASE) 50 MCG/ACT nasal spray as needed.  0  . ibuprofen (ADVIL) 800 MG tablet Take 1 tablet (800 mg total) by mouth every 8 (eight) hours as needed. 30 tablet 0  . NONFORMULARY OR COMPOUNDED ITEM Boric acid suppositories 600 mg. Place 1 suppository vaginally every week.  Dispense:  4, RF 2. 4 each 2  . norethindrone (MICRONOR) 0.35 MG tablet Take 1 tablet (0.35 mg total) by mouth daily. 3 Package 2  . omeprazole (PRILOSEC) 40 MG capsule Take 1 capsule by mouth as needed.  0  . ondansetron (ZOFRAN-ODT) 4 MG disintegrating tablet Take 1 tablet by mouth as needed.  0  . sertraline (ZOLOFT) 50 MG tablet TK 1 T PO QD     No current facility-administered medications for this visit.      ALLERGIES: Other, Percocet [oxycodone-acetaminophen], and  Valtrex [valacyclovir hcl]  Family History  Problem Relation Age of Onset  . Hypertension Father     Social History   Socioeconomic History  . Marital status: Single    Spouse name: Not on file  . Number of children: Not on file  . Years of education: Not on file  . Highest education level: Not on file  Occupational History  . Not on file  Social Needs  . Financial resource strain: Not on file  . Food insecurity    Worry: Not on file    Inability: Not on file  . Transportation needs    Medical: Not on file    Non-medical: Not on file  Tobacco Use  . Smoking status: Former Smoker    Types: Cigars  . Smokeless tobacco: Never Used  Substance and Sexual Activity  . Alcohol use: Not Currently    Alcohol/week: 0.0 standard drinks  . Drug use: No  . Sexual activity: Yes    Partners: Male    Birth control/protection: Pill    Comment: Micronor  Lifestyle  . Physical activity    Days per week: Not on file    Minutes per session: Not on file  . Stress: Not on file  Relationships  . Social Herbalist on phone: Not on file    Gets together: Not on file    Attends religious service: Not on file    Active member of club or organization: Not on file    Attends meetings of clubs or organizations: Not on file    Relationship status: Not on file  . Intimate partner violence    Fear of current or ex partner: Not on file    Emotionally abused: Not on file    Physically abused: Not on file    Forced sexual activity: Not on file  Other Topics Concern  . Not on file  Social History Narrative  . Not on file    Review of Systems  Constitutional: Negative.   HENT: Negative.   Eyes: Negative.   Respiratory: Negative.   Cardiovascular: Negative.   Gastrointestinal: Negative.   Endocrine: Negative.   Genitourinary: Negative.   Musculoskeletal: Negative.   Skin: Negative.   Allergic/Immunologic: Negative.   Neurological: Negative.   Hematological: Negative.    Psychiatric/Behavioral: Negative.     PHYSICAL EXAMINATION:    BP 110/70 (BP Location: Left Arm, Patient Position: Sitting, Cuff Size: Normal)   Pulse 76   Temp 98 F (36.7 C) (Temporal)  Resp 14   Ht 5\' 2"  (1.575 m)   Wt 150 lb (68 kg)   LMP 08/21/2018   BMI 27.44 kg/m     General appearance: alert, cooperative and appears stated age   Pelvic: External genitalia:  no lesions              Urethra:  normal appearing urethra with no masses, tenderness or lesions              Bartholins and Skenes: normal                 Vagina: normal appearing vagina with normal color and discharge, no lesions              Cervix:  Consistent with LEEP.  Some ectropion that is friable.                 Bimanual Exam:  Uterus:  normal size, contour, position, consistency, mobility, non-tender              Adnexa: no mass, fullness, tenderness          Chaperone was present for exam.  ASSESSMENT  Status post LEEP for HGSIL with positive margin.  Healing well.  Status post Mirena IUD removal.  PLAN  We discussed options for care for a positive margin on a LEEP - pap and ECC 4 months post op, re-excision, and hysterectomy.  FU in November for pap and ECC.  Wait for 2 weeks for resumption of intercourse.    An After Visit Summary was printed and given to the patient.

## 2018-09-21 ENCOUNTER — Telehealth: Payer: Self-pay | Admitting: Obstetrics and Gynecology

## 2018-09-21 ENCOUNTER — Encounter: Payer: Self-pay | Admitting: *Deleted

## 2018-09-21 MED ORDER — NORETHINDRONE ACETATE 5 MG PO TABS: 5.0000 mg | ORAL_TABLET | Freq: Two times a day (BID) | ORAL | 0 refills | Status: DC

## 2018-09-21 NOTE — Telephone Encounter (Signed)
I recommend stopping the Micronor and starting Aygestin 5 mg po bid.  Dispense:  60, RF none.  I would like to see her in the office next week for a recheck.   This medication does not prevent pregnancy.

## 2018-09-21 NOTE — Telephone Encounter (Signed)
Patient is concerned with heavy bleeding and abdominal pain.

## 2018-09-21 NOTE — Telephone Encounter (Signed)
Spoke with patient, advised per Dr. Quincy Simmonds. Rx for aygestin to verified pharmacy, patient is aware will need to use [facility] if SA. Patient declines OV next week, states she will be out of town. OV scheduled for 8/17 at 4:30pm with Dr. Quincy Simmonds. ER precautions provided for heavy bleeding, new or worsening symptoms.  Advised since not coming into office next week will review with Dr. Quincy Simmonds and advise if any additional recommendations.   Routing to Dr. Antony Blackbird.

## 2018-09-21 NOTE — Telephone Encounter (Signed)
Spoke with patient, s/p LEEP and IUD removal on 08/21/18. Reports bleeding 7/8 -7/28, started bleeding again "suddenly" on 8/6. Has been on POP for 3wks, no missed or late pills. Has changed a full pad 3 times today. Cramping 6/10 on lower left abdomen/pelvis, sharp when sitting. Using heating pad for comfort. Weak and fatigue. Denies SHOB, lightheadedness or dizziness. Denies fever/chills or N/V.   Advised patient can take 3 months for menses to adjust to new contraceptive. Advised will review with Dr. Quincy Simmonds and return call with recommendations. Patient agreeable.   Dr. Quincy Simmonds -please review and advise.

## 2018-09-24 NOTE — Telephone Encounter (Signed)
Encounter reviewed and closed.  Ok to wait until follow up week for an office visit.

## 2018-09-25 ENCOUNTER — Ambulatory Visit: Payer: BC Managed Care – PPO | Admitting: Diagnostic Neuroimaging

## 2018-09-28 ENCOUNTER — Other Ambulatory Visit: Payer: Self-pay

## 2018-09-28 NOTE — Progress Notes (Deleted)
GYNECOLOGY  VISIT   HPI: 33 y.o.   Single  African American  female   [phone] with No LMP recorded. (Menstrual status: IUD).   here for     GYNECOLOGIC HISTORY: No LMP recorded. (Menstrual status: IUD). Contraception: Micronor Menopausal hormone therapy:  n/a Last mammogram:  n/a Last pap smear:   7/6/20high grade dysplasia, CIN II, with positive margins, ECC benign; 06/27/18 LSIL;06/19/18 Colpo showed high grade dysplasia; 05/26/18 LGSIL HPV HR+        OB History    Gravida  3   Para  2   Term  2   Preterm  0   AB  1   Living  2     SAB  0   TAB  1   Ectopic  0   Multiple  0   Live Births  2              Patient Active Problem List   Diagnosis Date Noted  . Female bladder prolapse 08/04/2018  . Rectocele 08/04/2018  . GERD (gastroesophageal reflux disease) 03/09/2014  . Altered bowel habits 03/09/2014  . Bloating 03/09/2014  . Helicobacter pylori ab+ 03/09/2014  . 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 Menorrhagia 03/29/2013  . Encounter for counseling 03/29/2013  . Abdominal hernia 09/29/2011  . 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 Abdominal hernia   . Abnormal Pap smear of cervix 2005   HPV +, 05-26-2018 LGSIL HPV HR+  . Anemia   . Anxiety   . Chlamydia 08/18/2015   repeat testing negative October 2017.  Marland Kitchen Condyloma acuminatum of vulva   . Depression    but doesn't require meds  . GERD (gastroesophageal reflux disease)    only takes OTC meds  . History of bronchitis 3+yrs ago  . History of migraine 09/20/11-last one  . HSV-1 (herpes simplex virus 1) infection   . HSV-2 (herpes simplex virus 2) infection   . Hyperlipidemia    postpartum 2+yrs ago  . Hypertension    postpartum 2+yrs ago  . Low vitamin D level 2018  . Migraine    with aura  . Sickle cell trait ([place])   . Sleeping difficulty   . Trichomonas infection 09/2016    Past Surgical History:  Procedure Laterality Date  . ABDOMINOPLASTY   09/29/2011   Procedure: ABDOMINOPLASTY;  Surgeon: Theodoro Kos, DO;  Location: [facility];  Service: Plastics;  Laterality: N/A;  ABDOMINOPLASTY FOR REPAIR OF RECTUS DIASTASIS  . EYE SURGERY  1993  . INTRAUTERINE DEVICE (IUD) INSERTION     10-31-13 mirena iud inserted  . 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   . OVARIAN CYST REMOVAL  2012  . VENTRAL HERNIA REPAIR  09/29/2011   Procedure: HERNIA REPAIR VENTRAL ADULT;  Surgeon: Joyice Faster. Cornett, MD;  Location: [facility];  Service: General;  Laterality: N/A;    Current Outpatient Medications  Medication Sig Dispense Refill  . acetaminophen (TYLENOL) 500 MG tablet Take 1,000 mg by mouth every 6 (six) hours as needed. For pain    . aspirin-acetaminophen-caffeine (EXCEDRIN MIGRAINE) 250-250-65 MG per tablet Take 1 tablet by mouth daily as needed. For migraines    . fluticasone (FLONASE) 50 MCG/ACT nasal spray as needed.  0  . ibuprofen (ADVIL) 800 MG tablet Take 1 tablet (800 mg total) by mouth every 8 (eight) hours as needed. 30 tablet 0  . NONFORMULARY OR COMPOUNDED ITEM Boric acid  suppositories 600 mg. Place 1 suppository vaginally every week.  Dispense:  4, RF 2. 4 each 2  . norethindrone (AYGESTIN) 5 MG tablet Take 1 tablet (5 mg total) by mouth 2 (two) times daily. 60 tablet 0  . omeprazole (PRILOSEC) 40 MG capsule Take 1 capsule by mouth as needed.  0  . ondansetron (ZOFRAN-ODT) 4 MG disintegrating tablet Take 1 tablet by mouth as needed.  0  . sertraline (ZOLOFT) 50 MG tablet TK 1 T PO QD     No current facility-administered medications for this visit.      ALLERGIES: Other, Percocet [oxycodone-acetaminophen], and Valtrex [valacyclovir hcl]  Family History  Problem Relation Age of Onset  . Diabetes Mother   . Hypertension Mother   . Hypertension Father     Social History   Socioeconomic History  . Marital status: Single    Spouse name: Not on file  . Number of children: 2  . Years of education: Not on file  . Highest  education level: Not on file  Occupational History  . Not on file  Social Needs  . Financial resource strain: Not on file  . Food insecurity    Worry: Not on file    Inability: Not on file  . Transportation needs    Medical: Not on file    Non-medical: Not on file  Tobacco Use  . Smoking status: Former Smoker    Types: Cigars    Quit date: 09/21/2006    Years since quitting: 12.0  . Smokeless tobacco: Never Used  Substance and Sexual Activity  . Alcohol use: Not Currently    Alcohol/week: 0.0 standard drinks    Comment: occas  . Drug use: No  . Sexual activity: Yes    Partners: Male    Birth control/protection: Pill    Comment: Micronor  Lifestyle  . Physical activity    Days per week: Not on file    Minutes per session: Not on file  . Stress: Not on file  Relationships  . Social Herbalist on phone: Not on file    Gets together: Not on file    Attends religious service: Not on file    Active member of club or organization: Not on file    Attends meetings of clubs or organizations: Not on file    Relationship status: Not on file  . Intimate partner violence    Fear of current or ex partner: Not on file    Emotionally abused: Not on file    Physically abused: Not on file    Forced sexual activity: Not on file  Other Topics Concern  . Not on file  Social History Narrative  . Not on file    Review of Systems  PHYSICAL EXAMINATION:    There were no vitals taken for this visit.    General appearance: alert, cooperative and appears stated age Head: Normocephalic, without obvious abnormality, atraumatic Neck: no adenopathy, supple, symmetrical, trachea midline and thyroid normal to inspection and palpation Lungs: clear to auscultation bilaterally Breasts: normal appearance, no masses or tenderness, No nipple retraction or dimpling, No nipple discharge or bleeding, No axillary or supraclavicular adenopathy Heart: regular rate and rhythm Abdomen: soft,  non-tender, no masses,  no organomegaly Extremities: extremities normal, atraumatic, no cyanosis or edema Skin: Skin color, texture, turgor normal. No rashes or lesions Lymph nodes: Cervical, supraclavicular, and axillary nodes normal. No abnormal inguinal nodes palpated Neurologic: Grossly normal  Pelvic: External genitalia:  no lesions              Urethra:  normal appearing urethra with no masses, tenderness or lesions              Bartholins and Skenes: normal                 Vagina: normal appearing vagina with normal color and discharge, no lesions              Cervix: no lesions                Bimanual Exam:  Uterus:  normal size, contour, position, consistency, mobility, non-tender              Adnexa: no mass, fullness, tenderness              Rectal exam: {yes no:[id number]}.  Confirms.              Anus:  normal sphincter tone, no lesions  Chaperone was present for exam.  ASSESSMENT     PLAN     An After Visit Summary was printed and given to the patient.  ______ minutes face to face time of which over 50% was spent in counseling.

## 2018-10-02 ENCOUNTER — Ambulatory Visit: Payer: Self-pay | Admitting: Obstetrics and Gynecology

## 2018-10-03 ENCOUNTER — Encounter: Payer: Self-pay | Admitting: Diagnostic Neuroimaging

## 2018-10-03 ENCOUNTER — Telehealth: Payer: Self-pay | Admitting: Diagnostic Neuroimaging

## 2018-10-03 NOTE — Telephone Encounter (Signed)
I called patient to attempt to reschedule her 9/15 appt due to Dr. Leta Baptist time off. Patient's voicemail is currently full and I was unable to make contact. I have cancelled this appt and letter has been sent to patient to advise.

## 2018-10-04 NOTE — Progress Notes (Signed)
GYNECOLOGY  VISIT   HPI: 33 y.o.   Single  African American  female   [phone] with No LMP recorded. (Menstrual status: IUD).   here for follow up. Still having irregular bleeding. Long standing history of menometrorrhagia.   Having brown discharge. Taking Aygestin 5 mg po bid.  No real missed doses, but can take them a couple of hours late.  Not taking Micronor any more. She is happy to have Mirena IUD out.  Stopped bleeding for one week overall.   Had intercourse and no pain or bleeding with this.   Less dryness with Mirena out.  No more BV symptoms.   Leaning toward hysterectomy.  Tired of worrying about pain and bleeding.  She feels like this would be a gain for her.  She has talked with her family who is supportive. She does feel better on the Aygestin.   She took Depo in the past and she had bleeding prior to her follow up injection each time.   Pelvic US done 02/16/18 showing no masses, symmetric endometrium with IUD in correct position and normal ovaries with left CL cyst 16 mm.  EMB 2015 showed inactive endometrium, negative for atypia and malignancy.   She has some pelvic organ prolapse.  She has some urgency.  No consistent leakage with cough, laugh, and sneeze in the last 2 months because she is more sedentary due the pandemic.  She cannot jump rope and do jumping jacks however, due to urinary incontinence.    UPT: Neg  GYNECOLOGIC HISTORY: No LMP recorded. (Menstrual status: IUD). Contraception:  None.   Menopausal hormone therapy:  n/a Last mammogram: n/a Last pap smear: LEEP 7/6/20high grade dysplasia, CIN II, with positive margins, ECC benign; 06/27/18 LSIL;06/19/18 Colpo showed high grade dysplasia; 05/26/18 LGSIL HPV HR+        OB History    Gravida  3   Para  2   Term  2   Preterm  0   AB  1   Living  2     SAB  0   TAB  1   Ectopic  0   Multiple  0   Live Births  2              Patient Active Problem List   Diagnosis Date  Noted  . Female bladder prolapse 08/04/2018  . Rectocele 08/04/2018  . GERD (gastroesophageal reflux disease) 03/09/2014  . Altered bowel habits 03/09/2014  . Bloating 03/09/2014  . Helicobacter pylori ab+ 03/09/2014  . 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 Menorrhagia 03/29/2013  . Encounter for counseling 03/29/2013  . Abdominal hernia 09/29/2011  . 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 Abdominal hernia   . Abnormal Pap smear of cervix 2005   HPV +, 05-26-2018 LGSIL HPV HR+  . Anemia   . Anxiety   . Chlamydia 08/18/2015   repeat testing negative October 2017.  Marland Kitchen Condyloma acuminatum of vulva   . Depression    but doesn't require meds  . GERD (gastroesophageal reflux disease)    only takes OTC meds  . History of bronchitis 3+yrs ago  . History of migraine 09/20/11-last one  . HSV-1 (herpes simplex virus 1) infection   . HSV-2 (herpes simplex virus 2) infection   . Hyperlipidemia    postpartum 2+yrs ago  . Hypertension    postpartum 2+yrs ago  . Low vitamin D level 2018  . Migraine  with aura  . Sickle cell trait ([place])   . Sleeping difficulty   . Trichomonas infection 09/2016    Past Surgical History:  Procedure Laterality Date  . ABDOMINOPLASTY  09/29/2011   Procedure: ABDOMINOPLASTY;  Surgeon: Theodoro Kos, DO;  Location: [facility];  Service: Plastics;  Laterality: N/A;  ABDOMINOPLASTY FOR REPAIR OF RECTUS DIASTASIS  . EYE SURGERY  1993  . INTRAUTERINE DEVICE (IUD) INSERTION     10-31-13 mirena iud inserted  . 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   . OVARIAN CYST REMOVAL  2012  . VENTRAL HERNIA REPAIR  09/29/2011   Procedure: HERNIA REPAIR VENTRAL ADULT;  Surgeon: Joyice Faster. Cornett, MD;  Location: [facility];  Service: General;  Laterality: N/A;    Current Outpatient Medications  Medication Sig Dispense Refill  . acetaminophen (TYLENOL) 500 MG tablet Take 1,000 mg by mouth every 6 (six) hours as needed. For pain    .  aspirin-acetaminophen-caffeine (EXCEDRIN MIGRAINE) 250-250-65 MG per tablet Take 1 tablet by mouth daily as needed. For migraines    . fluticasone (FLONASE) 50 MCG/ACT nasal spray as needed.  0  . ibuprofen (ADVIL) 800 MG tablet Take 1 tablet (800 mg total) by mouth every 8 (eight) hours as needed. 30 tablet 0  . NONFORMULARY OR COMPOUNDED ITEM Boric acid suppositories 600 mg. Place 1 suppository vaginally every week.  Dispense:  4, RF 2. 4 each 2  . norethindrone (AYGESTIN) 5 MG tablet Take 1 tablet (5 mg total) by mouth 2 (two) times daily. 60 tablet 0  . omeprazole (PRILOSEC) 40 MG capsule Take 1 capsule by mouth as needed.  0  . ondansetron (ZOFRAN-ODT) 4 MG disintegrating tablet Take 1 tablet by mouth as needed.  0  . sertraline (ZOLOFT) 50 MG tablet TK 1 T PO QD     No current facility-administered medications for this visit.      ALLERGIES: Other, Percocet [oxycodone-acetaminophen], and Valtrex [valacyclovir hcl]  Family History  Problem Relation Age of Onset  . Diabetes Mother   . Hypertension Mother   . Hypertension Father     Social History   Socioeconomic History  . Marital status: Single    Spouse name: Not on file  . Number of children: 2  . Years of education: Not on file  . Highest education level: Not on file  Occupational History  . Not on file  Social Needs  . Financial resource strain: Not on file  . Food insecurity    Worry: Not on file    Inability: Not on file  . Transportation needs    Medical: Not on file    Non-medical: Not on file  Tobacco Use  . Smoking status: Former Smoker    Types: Cigars    Quit date: 09/21/2006    Years since quitting: 12.0  . Smokeless tobacco: Never Used  Substance and Sexual Activity  . Alcohol use: Not Currently    Alcohol/week: 0.0 standard drinks    Comment: occas  . Drug use: No  . Sexual activity: Yes    Partners: Male    Birth control/protection: Pill    Comment: Micronor  Lifestyle  . Physical activity     Days per week: Not on file    Minutes per session: Not on file  . Stress: Not on file  Relationships  . Social Herbalist on phone: Not on file    Gets together: Not on file  Attends religious service: Not on file    Active member of club or organization: Not on file    Attends meetings of clubs or organizations: Not on file    Relationship status: Not on file  . Intimate partner violence    Fear of current or ex partner: Not on file    Emotionally abused: Not on file    Physically abused: Not on file    Forced sexual activity: Not on file  Other Topics Concern  . Not on file  Social History Narrative  . Not on file    Review of Systems  All other systems reviewed and are negative.   PHYSICAL EXAMINATION:    BP 140/90   Pulse 70   Temp (!) 97.2 F (36.2 C) (Temporal)   Ht 5\' 2"  (1.575 m)   Wt 146 lb 9.6 oz (66.5 kg)   BMI 26.81 kg/m     General appearance: alert, cooperative and appears stated age  Pelvic: External genitalia:  no lesions              Urethra:  normal appearing urethra with no masses, tenderness or lesions              Bartholins and Skenes: normal                 Vagina: normal appearing vagina with normal color and discharge, no lesions              Cervix: no lesions.  Consistent with LEEP  Nodule lateral to left cervix.                 Bimanual Exam:  Uterus:  normal size, contour, position, consistency, mobility, non-tender              Adnexa: no mass, fullness, tenderness                Chaperone was present for exam.  ASSESSMENT  Hx menometrorrhagia.  Status post Mirena IUD removal. Irregular bleeding and cramping currently treated with Aygestin.  Positive margin on LEEP 08/21/18.  CIN II with positive margin.  Nodule near left posterior cervix.  Endometriosis versus fibroid. Hx ventral hernia repair with abdominoplasty.  No mesh per patient.  Op reports in Epic. Prolapse.  Migraine HA with aura.  PLAN  We had a  comprehensive discussion regarding her abnormal uterine bleeding and cramping and failed medical therapy.  We discussed her CIN II and positive margin on her LEEP specimen.  Urinary stress incontinence and prolapse also reviewed.  Laparoscopic hysterectomy with bilateral salpingectomy, possible bilateral oophorectomy, and surgery for prolapse and incontinence outlined as a comprehensive surgical approach to her care. Would like to keep her ovaries if normal.    ACOG HO on hysterectomy, prolapse and incontinence and surgery for these conditions provided to patient today.  Increase Aygestin to 5 mg tid.  Return for urodynamic testing.    An After Visit Summary was printed and given to the patient.  ___25___ minutes face to face time of which over 50% was spent in counseling.

## 2018-10-05 ENCOUNTER — Encounter: Payer: Self-pay | Admitting: Obstetrics and Gynecology

## 2018-10-05 ENCOUNTER — Other Ambulatory Visit: Payer: Self-pay

## 2018-10-05 ENCOUNTER — Ambulatory Visit (INDEPENDENT_AMBULATORY_CARE_PROVIDER_SITE_OTHER): Payer: BC Managed Care – PPO | Admitting: Obstetrics and Gynecology

## 2018-10-05 VITALS — BP 140/90 | HR 70 | Temp 97.2°F | Ht 62.0 in | Wt 146.6 lb

## 2018-10-05 DIAGNOSIS — N393 Stress incontinence (female) (male): Secondary | ICD-10-CM | POA: Diagnosis not present

## 2018-10-05 DIAGNOSIS — N871 Moderate cervical dysplasia: Secondary | ICD-10-CM

## 2018-10-05 DIAGNOSIS — N926 Irregular menstruation, unspecified: Secondary | ICD-10-CM

## 2018-10-05 LAB — POCT URINE PREGNANCY: Preg Test, Ur: NEGATIVE

## 2018-10-05 MED ORDER — NORETHINDRONE ACETATE 5 MG PO TABS: 5.0000 mg | ORAL_TABLET | Freq: Three times a day (TID) | ORAL | 1 refills | Status: DC

## 2018-10-11 ENCOUNTER — Telehealth: Payer: Self-pay | Admitting: Obstetrics and Gynecology

## 2018-10-11 NOTE — Telephone Encounter (Signed)
Call placed to patient to review benefit for urodynamic testing. Patient acknowledges understanding of information presented. Patient desire to proceed with scheduling. Advised patient testing dates for September have not been established, but once date established, we will return call to her for scheduling. Patient is agreeable.   Forwarding To Lamont Snowball, RN  cc: [name]

## 2018-10-16 NOTE — Telephone Encounter (Signed)
Call to patient. Urodynamic Bladder Testing form reviewed in detail with patient and she verbalized understanding. Patient scheduled for urodynamics on 11-01-2018 at 1030. Patient agreeable to date and time of appointment. Pre procedure urinalysis scheduled for 10-27-2018 at 0845. Patient declines to scheduled appointment for consult to review results, states she would like to schedule when she comes in for the procedure. Advised would mail a copy to patient. Address on file confirmed.   Routing to provider and will close encounter.   Cc Lamont Snowball, RN

## 2018-10-24 ENCOUNTER — Telehealth: Payer: Self-pay | Admitting: Obstetrics and Gynecology

## 2018-10-24 NOTE — Telephone Encounter (Signed)
Spoke with patient. Hx of recurrent BV, used a new lubricant. Reports some vaginal  Itching, white vaginal d/c and odor. Denies pain, burning or bleeding. Symptoms started 3 days ago. Requesting RX. Advised patient she will need OV for further evaluation, OV scheduled for 9/9 at 3:30pm with Dr. Quincy Simmonds. Covid 19 prescreen negative, precautions reviewed.   Patient is scheduled for urodynamics on 9/16, asking if she can do UA while in office on 9/9 and cancel nurse visit on 9/11?    Dr. Quincy Simmonds -please advise?

## 2018-10-24 NOTE — Telephone Encounter (Signed)
Patient would like a prescription for flagyl sent to pharmacy for bv symptoms.

## 2018-10-24 NOTE — Telephone Encounter (Signed)
Ok to do urine check on the day of her check for vaginitis on 10/25/18 and cancel the nurse visit for 10/27/18.

## 2018-10-25 ENCOUNTER — Encounter: Payer: Self-pay | Admitting: Obstetrics and Gynecology

## 2018-10-25 ENCOUNTER — Other Ambulatory Visit: Payer: Self-pay

## 2018-10-25 ENCOUNTER — Ambulatory Visit: Payer: BC Managed Care – PPO | Admitting: Obstetrics and Gynecology

## 2018-10-25 VITALS — BP 122/84 | HR 84 | Temp 98.0°F | Ht 62.0 in | Wt 153.8 lb

## 2018-10-25 DIAGNOSIS — N898 Other specified noninflammatory disorders of vagina: Secondary | ICD-10-CM | POA: Diagnosis not present

## 2018-10-25 DIAGNOSIS — R5383 Other fatigue: Secondary | ICD-10-CM

## 2018-10-25 DIAGNOSIS — R350 Frequency of micturition: Secondary | ICD-10-CM

## 2018-10-25 DIAGNOSIS — R8290 Unspecified abnormal findings in urine: Secondary | ICD-10-CM | POA: Diagnosis not present

## 2018-10-25 DIAGNOSIS — Z01812 Encounter for preprocedural laboratory examination: Secondary | ICD-10-CM | POA: Diagnosis not present

## 2018-10-25 LAB — POCT URINALYSIS DIPSTICK
Bilirubin, UA: NEGATIVE
Blood, UA: NEGATIVE
Glucose, UA: NEGATIVE
Ketones, UA: NEGATIVE
Nitrite, UA: NEGATIVE
Protein, UA: NEGATIVE
Urobilinogen, UA: 0.2 E.U./dL
pH, UA: 5 (ref 5.0–8.0)

## 2018-10-25 NOTE — Progress Notes (Signed)
GYNECOLOGY  VISIT   HPI: 33 y.o.   Single  African American  female   [phone] with No LMP recorded.   here for vaginal discharge with irritation and odor.  She states something is off with her hormones.  She feels dry most of the time.  Using [facility] lubricant, and is developing odor.  Now having milky white discharge.   No bleeding since starting Aygestin.   Taking Zoloft 25 mg for mood swings for one month, prescribed by her PCP.  Her mood is now improved.  She states she has had a lot of anxiety this year.  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   She is having fatigue and night sweats.   She is concerned about shrinkage of the vulvar area. She denies weight change.   She is concerned about not being able to feel her nipples and think there may be inversion.   Patient also here for pre-urodynamics urinalysis.   Urine [id number] WBCs  GYNECOLOGIC HISTORY: No LMP recorded. Contraception: Withdrawal/Aygestin (taking 1.5 bid) Menopausal hormone therapy:  n/a Last mammogram:  n/a Last pap smear: 05-26-18 LSIL:Pos HR HPV, 7/6/20high grade dysplasia, CIN II, with positive margins, ECC benign; 06/27/18 LSIL;06/19/18 Colpo showed high grade dysplasia        OB History    Gravida  3   Para  2   Term  2   Preterm  0   AB  1   Living  2     SAB  0   TAB  1   Ectopic  0   Multiple  0   Live Births  2              Patient Active Problem List   Diagnosis Date Noted  . Female bladder prolapse 08/04/2018  . Rectocele 08/04/2018  . GERD (gastroesophageal reflux disease) 03/09/2014  . Altered bowel habits 03/09/2014  . Bloating 03/09/2014  . Helicobacter pylori ab+ 03/09/2014  . 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 Menorrhagia 03/29/2013  . Encounter for counseling 03/29/2013  . Abdominal hernia 09/29/2011  . 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  Abdominal hernia   . Abnormal Pap smear of cervix 2005   HPV +, 05-26-2018 LGSIL HPV HR+  . Anemia   . Anxiety   . Chlamydia 08/18/2015   repeat testing negative October 2017.  Marland Kitchen Condyloma acuminatum of vulva   . Depression    but doesn't require meds  . GERD (gastroesophageal reflux disease)    only takes OTC meds  . History of bronchitis 3+yrs ago  . History of migraine 09/20/11-last one  . HSV-1 (herpes simplex virus 1) infection   . HSV-2 (herpes simplex virus 2) infection   . Hyperlipidemia    postpartum 2+yrs ago  . Hypertension    postpartum 2+yrs ago  . Low vitamin D level 2018  . Migraine    with aura  . Sickle cell trait ([place])   . Sleeping difficulty   . Trichomonas infection 09/2016    Past Surgical History:  Procedure Laterality Date  . ABDOMINOPLASTY  09/29/2011   Procedure: ABDOMINOPLASTY;  Surgeon: Theodoro Kos, DO;  Location: [facility];  Service: Plastics;  Laterality: N/A;  ABDOMINOPLASTY FOR REPAIR OF RECTUS DIASTASIS  . EYE SURGERY  1993  . INTRAUTERINE DEVICE (IUD) INSERTION     10-31-13 mirena iud inserted  .  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   . OVARIAN CYST REMOVAL  2012  . VENTRAL HERNIA REPAIR  09/29/2011   Procedure: HERNIA REPAIR VENTRAL ADULT;  Surgeon: Joyice Faster. Cornett, MD;  Location: [facility];  Service: General;  Laterality: N/A;    Current Outpatient Medications  Medication Sig Dispense Refill  . acetaminophen (TYLENOL) 500 MG tablet Take 1,000 mg by mouth every 6 (six) hours as needed. For pain    . aspirin-acetaminophen-caffeine (EXCEDRIN MIGRAINE) 250-250-65 MG per tablet Take 1 tablet by mouth daily as needed. For migraines    . fluticasone (FLONASE) 50 MCG/ACT nasal spray as needed.  0  . ibuprofen (ADVIL) 800 MG tablet Take 1 tablet (800 mg total) by mouth every 8 (eight) hours as needed. 30 tablet 0  . norethindrone (AYGESTIN) 5 MG tablet Take 1 tablet (5 mg total) by mouth 3 (three) times daily. 90 tablet 1  . omeprazole (PRILOSEC)  40 MG capsule Take 1 capsule by mouth as needed.  0  . ondansetron (ZOFRAN-ODT) 4 MG disintegrating tablet Take 1 tablet by mouth as needed.  0  . sertraline (ZOLOFT) 50 MG tablet TK 1 T PO QD    . metroNIDAZOLE (FLAGYL) 500 MG tablet Take 1 tablet (500 mg total) by mouth 2 (two) times daily. 14 tablet 0  . NONFORMULARY OR COMPOUNDED ITEM Boric acid vaginal suppositories 600 mg. Place 1 suppository vaginally nightly for 21 days.  Dispense: 21; No refills 21 each 0   No current facility-administered medications for this visit.      ALLERGIES: Other, Percocet [oxycodone-acetaminophen], and Valtrex [valacyclovir hcl]  Family History  Problem Relation Age of Onset  . Diabetes Mother   . Hypertension Mother   . Hypertension Father     Social History   Socioeconomic History  . Marital status: Single    Spouse name: Not on file  . Number of children: 2  . Years of education: Not on file  . Highest education level: Not on file  Occupational History  . Not on file  Social Needs  . Financial resource strain: Not on file  . Food insecurity    Worry: Not on file    Inability: Not on file  . Transportation needs    Medical: Not on file    Non-medical: Not on file  Tobacco Use  . Smoking status: Former Smoker    Types: Cigars    Quit date: 09/21/2006    Years since quitting: 12.1  . Smokeless tobacco: Never Used  Substance and Sexual Activity  . Alcohol use: Not Currently    Alcohol/week: 0.0 standard drinks    Comment: occas  . Drug use: No  . Sexual activity: Yes    Partners: Male    Birth control/protection: Pill    Comment: Micronor  Lifestyle  . Physical activity    Days per week: Not on file    Minutes per session: Not on file  . Stress: Not on file  Relationships  . Social Herbalist on phone: Not on file    Gets together: Not on file    Attends religious service: Not on file    Active member of club or organization: Not on file    Attends meetings of  clubs or organizations: Not on file    Relationship status: Not on file  . Intimate partner violence    Fear of current or ex partner: Not on file  Emotionally abused: Not on file    Physically abused: Not on file    Forced sexual activity: Not on file  Other Topics Concern  . Not on file  Social History Narrative  . Not on file    Review of Systems  All other systems reviewed and are negative.   PHYSICAL EXAMINATION:    BP 122/84   Pulse 84   Temp 98 F (36.7 C)   Ht 5\' 2"  (1.575 m)   Wt 153 lb 12.8 oz (69.8 kg)   BMI 28.13 kg/m     General appearance: alert, cooperative and appears stated age   Breasts:  No dominant masses. No tenderness. No nipple inversion, retraction, or discharge. No axillary adenopathy.   Pelvic: External genitalia:  no lesions              Urethra:  normal appearing urethra with no masses, tenderness or lesions              Bartholins and Skenes: normal                 Vagina: normal appearing vagina with normal color and discharge, no lesions              Cervix: no lesions                Bimanual Exam:  Uterus:  normal size, contour, position, consistency, mobility, non-tender           Chaperone was present for exam.  ASSESSMENT  Vaginitis.  Vaginal dryness.   Fatigue. Night sweats.  Anxiety.  Urinary incontinence.  Urine showing trace WBCs.  Menometrorrhagia.  On Aygestin.    PLAN  Affirm done.  We talked about doing an acute treatment for BV if appropriate and then starting a suppression regimen with Metrogel. Check TSH and BMP.  She will try cooking oils to help increase moisture.  I support her having treatment for her anxiety. I gave her reassurance regarding her anatomy.  Urine culture.  FU for urodynamic testing, pending results of her UC.   An After Visit Summary was printed and given to the patient.  __25____ minutes face to face time of which over 50% was spent in counseling.

## 2018-10-25 NOTE — Telephone Encounter (Signed)
Spoke with patient, advised per Dr. Quincy Simmonds. Nurse visit cancelled for 9/11, patient will see Dr. Quincy Simmonds for [place] today at 3:30pm. Patient verbalizes understanding and is agreeable.   Encounter closed.

## 2018-10-26 ENCOUNTER — Other Ambulatory Visit: Payer: Self-pay | Admitting: *Deleted

## 2018-10-26 LAB — BASIC METABOLIC PANEL
BUN/Creatinine Ratio: 12 (ref 9–23)
BUN: 12 mg/dL (ref 6–20)
CO2: 24 mmol/L (ref 20–29)
Calcium: 9.3 mg/dL (ref 8.7–10.2)
Chloride: 103 mmol/L (ref 96–106)
Creatinine, Ser: 0.98 mg/dL (ref 0.57–1.00)
GFR calc Af Amer: 88 mL/min/{1.73_m2} (ref >59)
GFR calc non Af Amer: 76 mL/min/{1.73_m2} (ref >59)
Glucose: 84 mg/dL (ref 65–99)
Potassium: 4.2 mmol/L (ref 3.5–5.2)
Sodium: 140 mmol/L (ref 134–144)

## 2018-10-26 LAB — VAGINITIS/VAGINOSIS, DNA PROBE
Candida Species: NEGATIVE
Gardnerella vaginalis: POSITIVE — AB
Trichomonas vaginosis: NEGATIVE

## 2018-10-26 LAB — TSH: TSH: 1.23 u[IU]/mL (ref 0.450–4.500)

## 2018-10-26 MED ORDER — METRONIDAZOLE 500 MG PO TABS: 500.0000 mg | ORAL_TABLET | Freq: Two times a day (BID) | ORAL | 0 refills | Status: DC

## 2018-10-26 MED ORDER — NONFORMULARY OR COMPOUNDED ITEM: 0 refills | Status: DC

## 2018-10-27 ENCOUNTER — Ambulatory Visit: Payer: BC Managed Care – PPO

## 2018-10-27 LAB — URINE CULTURE

## 2018-10-31 ENCOUNTER — Ambulatory Visit: Payer: BC Managed Care – PPO | Admitting: Diagnostic Neuroimaging

## 2018-11-01 ENCOUNTER — Ambulatory Visit (INDEPENDENT_AMBULATORY_CARE_PROVIDER_SITE_OTHER): Payer: BC Managed Care – PPO | Admitting: *Deleted

## 2018-11-01 ENCOUNTER — Other Ambulatory Visit: Payer: Self-pay

## 2018-11-01 VITALS — BP 120/86 | HR 86 | Resp 14 | Ht 62.0 in | Wt 147.4 lb

## 2018-11-01 DIAGNOSIS — N393 Stress incontinence (female) (male): Secondary | ICD-10-CM

## 2018-11-01 NOTE — Progress Notes (Signed)
Toni Warren is a 33 y.o. female Who presents today for urodynamics testing, ordered by Dr. Quincy Simmonds   Allergies and medications reviewed.  Denies complaints today. No urinary complaints.   Urine Micro exam: negative for WBC's or RBC's, okay to proceed per Dr. Quincy Simmonds.  Patient reports urinary leakage with coughing, sneezing, exercise and intercourse .   Urodynamics testing initiated. Lumax Bladder Catheter #10 [place] and lumax Abdominal Catheter #10 [place].   Post void residual= 3 drops.   Urethral catheter placed without issue. Rectal catheter placed without issue.   Urodynamics testing completed. Please see scanned Patient summary report in Epic. Procedure completed and patient tolerated well without complaints. Patient scheduled for follow up office visit on 11-10-2018 with Dr. Quincy Simmonds to discuss results. Patient agreeable.   Patient given post procedure instructions:  You may have a mild bladder and rectal discomfort for a few hours after the test. You may experience some frequent urination and slight burning the first few times you urinate after the test. Rarely, the urine may be blood tinged. These are both due to catheter placements and resolve quickly. You should call our office immediately if you have signs of infection, which may include bladder pain, urinary urgency, fever, or burning during urination. We do encourage you to drink plenty of water after the test.

## 2018-11-02 ENCOUNTER — Telehealth: Payer: Self-pay | Admitting: *Deleted

## 2018-11-02 NOTE — Telephone Encounter (Signed)
Call to patient to discuss potential dates for surgery following urodynamic testing.  Patient interested in end of October.  Will begin insurance precert.

## 2018-11-08 ENCOUNTER — Other Ambulatory Visit: Payer: Self-pay

## 2018-11-08 NOTE — Telephone Encounter (Signed)
Call placed to patient to review benefit for surgery. Left voicemail message requesting a return call   cc: Lamont Snowball, RN

## 2018-11-10 ENCOUNTER — Encounter: Payer: Self-pay | Admitting: Obstetrics and Gynecology

## 2018-11-10 ENCOUNTER — Ambulatory Visit (INDEPENDENT_AMBULATORY_CARE_PROVIDER_SITE_OTHER): Payer: BC Managed Care – PPO | Admitting: Obstetrics and Gynecology

## 2018-11-10 ENCOUNTER — Other Ambulatory Visit: Payer: Self-pay

## 2018-11-10 VITALS — BP 122/76 | HR 88 | Temp 97.2°F | Resp 12 | Ht 62.0 in | Wt 155.0 lb

## 2018-11-10 DIAGNOSIS — N393 Stress incontinence (female) (male): Secondary | ICD-10-CM | POA: Diagnosis not present

## 2018-11-10 NOTE — Progress Notes (Signed)
GYNECOLOGY  VISIT   HPI: 33 y.o.   Single  African American  female   [phone] with Patient's last menstrual period was 09/11/2018 (within days).   here for urodynamics consult.  States she is accepting and surrendering to doing her surgery.  She wants one surgery and to get help with her bleeding, pain, and urinary incontinence.  She is working toward hysterectomy and urinary incontinence surgery.  She leaks with intercourse, cough, laugh, and sneeze.  Her urodynamic testing showed: Uroflow: void 47 cc, PVR 3 drops.  CMG:  S1 56 ml H2O, S2 215 ml H2O, S3 305 ml H2O.  VLPP 103 cm H2O at 345 cc.  UCP:  36 cm H2O Pressure flow:  Pdet max 16 cm H20.  Voided 492 cc.   She is taking Aygestin 7.5 mg once daily to control her bleeding.  Her last pelvic ultrasound as normal on 02/16/18.  Her EMB done 09/28/13 showed benign inactive endometrium and preceded the various medical therapies she has done for her menometrorrhagia.  She is struggling with vaginal dryness.  She states she has adequate vaginal moisture except when she is having sex.  Then she feels dry.  She is using various products to treat this.   Having night sweats and feels drenched.  She can also feel hot during the day.  TSH 1.230 10/25/18.   She stopped her Zoloft. It made her feel drowsy.   GYNECOLOGIC HISTORY: Patient's last menstrual period was 09/11/2018 (within days). Contraception:  Withdrawal Menopausal hormone therapy:  n/a Last mammogram:  n/a Last pap smear:   05-26-18 LSIL:Pos HR HPV,7/6/20high grade dysplasia, CIN II, with positive margins, ECC benign; 06/27/18 LSIL;06/19/18 Colpo showed high grade dysplasia        OB History    Gravida  3   Para  2   Term  2   Preterm  0   AB  1   Living  2     SAB  0   TAB  1   Ectopic  0   Multiple  0   Live Births  2              Patient Active Problem List   Diagnosis Date Noted  . Female bladder prolapse 08/04/2018  . Rectocele 08/04/2018   . GERD (gastroesophageal reflux disease) 03/09/2014  . Altered bowel habits 03/09/2014  . Bloating 03/09/2014  . Helicobacter pylori ab+ 03/09/2014  . 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 Menorrhagia 03/29/2013  . Encounter for counseling 03/29/2013  . Abdominal hernia 09/29/2011  . 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 Abdominal hernia   . Abnormal Pap smear of cervix 2005   HPV +, 05-26-2018 LGSIL HPV HR+  . Anemia   . Anxiety   . Chlamydia 08/18/2015   repeat testing negative October 2017.  Marland Kitchen Condyloma acuminatum of vulva   . Depression    but doesn't require meds  . GERD (gastroesophageal reflux disease)    only takes OTC meds  . History of bronchitis 3+yrs ago  . History of migraine 09/20/11-last one  . HSV-1 (herpes simplex virus 1) infection   . HSV-2 (herpes simplex virus 2) infection   . Hyperlipidemia    postpartum 2+yrs ago  . Hypertension    postpartum 2+yrs ago  . Low vitamin D level 2018  . Migraine    with aura  . Sickle cell trait ([place])   . Sleeping difficulty   .  Trichomonas infection 09/2016    Past Surgical History:  Procedure Laterality Date  . ABDOMINOPLASTY  09/29/2011   Procedure: ABDOMINOPLASTY;  Surgeon: Theodoro Kos, DO;  Location: [facility];  Service: Plastics;  Laterality: N/A;  ABDOMINOPLASTY FOR REPAIR OF RECTUS DIASTASIS  . EYE SURGERY  1993  . INTRAUTERINE DEVICE (IUD) INSERTION     10-31-13 mirena iud inserted  . 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   . OVARIAN CYST REMOVAL  2012  . VENTRAL HERNIA REPAIR  09/29/2011   Procedure: HERNIA REPAIR VENTRAL ADULT;  Surgeon: Joyice Faster. Cornett, MD;  Location: [facility];  Service: General;  Laterality: N/A;    Current Outpatient Medications  Medication Sig Dispense Refill  . acetaminophen (TYLENOL) 500 MG tablet Take 1,000 mg by mouth every 6 (six) hours as needed. For pain    . aspirin-acetaminophen-caffeine (EXCEDRIN MIGRAINE) 250-250-65 MG per  tablet Take 1 tablet by mouth daily as needed. For migraines    . fluticasone (FLONASE) 50 MCG/ACT nasal spray as needed.  0  . ibuprofen (ADVIL) 800 MG tablet Take 1 tablet (800 mg total) by mouth every 8 (eight) hours as needed. 30 tablet 0  . NONFORMULARY OR COMPOUNDED ITEM Boric acid vaginal suppositories 600 mg. Place 1 suppository vaginally nightly for 21 days.  Dispense: 21; No refills 21 each 0  . norethindrone (AYGESTIN) 5 MG tablet Take 1 tablet (5 mg total) by mouth 3 (three) times daily. 90 tablet 1  . omeprazole (PRILOSEC) 40 MG capsule Take 1 capsule by mouth as needed.  0  . ondansetron (ZOFRAN-ODT) 4 MG disintegrating tablet Take 1 tablet by mouth as needed.  0  . sertraline (ZOLOFT) 50 MG tablet TK 1 T PO QD     No current facility-administered medications for this visit.      ALLERGIES: Other, Percocet [oxycodone-acetaminophen], and Valtrex [valacyclovir hcl]  Family History  Problem Relation Age of Onset  . Diabetes Mother   . Hypertension Mother   . Hypertension Father     Social History   Socioeconomic History  . Marital status: Single    Spouse name: Not on file  . Number of children: 2  . Years of education: Not on file  . Highest education level: Not on file  Occupational History  . Not on file  Social Needs  . Financial resource strain: Not on file  . Food insecurity    Worry: Not on file    Inability: Not on file  . Transportation needs    Medical: Not on file    Non-medical: Not on file  Tobacco Use  . Smoking status: Former Smoker    Types: Cigars    Quit date: 09/21/2006    Years since quitting: 12.1  . Smokeless tobacco: Never Used  Substance and Sexual Activity  . Alcohol use: Not Currently    Alcohol/week: 0.0 standard drinks    Comment: occas  . Drug use: No  . Sexual activity: Yes    Partners: Male    Birth control/protection: Pill    Comment: Micronor  Lifestyle  . Physical activity    Days per week: Not on file    Minutes per  session: Not on file  . Stress: Not on file  Relationships  . Social Herbalist on phone: Not on file    Gets together: Not on file    Attends religious service: Not on file    Active member of club or  organization: Not on file    Attends meetings of clubs or organizations: Not on file    Relationship status: Not on file  . Intimate partner violence    Fear of current or ex partner: Not on file    Emotionally abused: Not on file    Physically abused: Not on file    Forced sexual activity: Not on file  Other Topics Concern  . Not on file  Social History Narrative  . Not on file    Review of Systems  Constitutional: Negative.   HENT: Negative.   Eyes: Negative.   Respiratory: Negative.   Cardiovascular: Negative.   Gastrointestinal: Negative.   Endocrine: Negative.   Genitourinary: Negative.   Musculoskeletal: Negative.   Skin: Negative.   Allergic/Immunologic: Negative.   Neurological: Negative.   Hematological: Negative.   Psychiatric/Behavioral: Negative.     PHYSICAL EXAMINATION:    BP 122/76 (BP Location: Right Arm, Patient Position: Sitting, Cuff Size: Normal)   Pulse 88   Temp (!) 97.2 F (36.2 C) (Temporal)   Resp 12   Ht 5\' 2"  (1.575 m)   Wt 155 lb (70.3 kg)   LMP 09/11/2018 (Within Days)   BMI 28.35 kg/m     General appearance: alert, cooperative and appears stated age   ASSESSMENT  Genuine stress incontinence.  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 Menometrorrhagia.  Normal pelvic US.  Status post benign EMB 2015, done for this reason.  Failed medical therapies. Current Aygestin use.  I suspect she may have endometriosis.  Status post repair of ventral periumbilical hernia and diastasis using #1 Ethibond. Status post abdominoplasty.  Hot flashes.  Uncertain etiology.   PLAN  We reviewed her urodynamic testing and discussed genuine stress incontinence - risk factors and focused on  surgical correction as she is committed to this route of therapy.    Surgical treatment with midurethral sling with permanent mesh and cystoscopy discussed at the current standard of care.   I discussed slings being 85 - 90% effective in treatment of stress incontinence.  I discussed risks of slings including but not limited to erosions and exposure, cystotomy, urinary retention and slower voiding, increase in urgency symptoms, need for prolonged catheterization or self catheterization, urinary tract infections, bleeding, infection, damage to surrounding organs, reaction to anesthesia, DVT, PE, death, need for reoperation, and recurrence of incontinence.  She wishes to proceed with this at the time of laparoscopic hysterectomy.  I did give her an ACOG HO on surgical treatment of urinary incontinence and did recommend against a Freada Bergeron Plication as an alternative for stress incontinence as this is an inferior treatment but is an option if a patient declines having an implant/graft of any kind used.    An After Visit Summary was printed and given to the patient.  __40____ minutes face to face time of which over 50% was spent in counseling.

## 2018-11-20 ENCOUNTER — Encounter: Payer: Self-pay | Admitting: Obstetrics and Gynecology

## 2018-11-20 DIAGNOSIS — N393 Stress incontinence (female) (male): Secondary | ICD-10-CM

## 2018-11-20 NOTE — Telephone Encounter (Signed)
Follow-up call to patient regarding plan to delay surgery. Needs pap and HPV in November ( 4 months following LEEP) is not proceeding with hysterectomy. Unable to leave message- voice mail on number indicates call cannot be completed. Will send My Chart message.     Recall is already in place for 01-15-19.   Routing to Dr Quincy Simmonds. Encounter closed.

## 2018-11-20 NOTE — Telephone Encounter (Signed)
Patient returned call. Reviewed benefit for surgery plan with added procedures after urodynamic testing. Patient acknowledges understanding of information presented. Patient advises she will call back when ready to proceed.  Forwarding to Lamont Snowball, RN

## 2018-12-18 ENCOUNTER — Encounter: Payer: Self-pay | Admitting: Obstetrics and Gynecology

## 2018-12-18 ENCOUNTER — Telehealth: Payer: Self-pay | Admitting: Obstetrics and Gynecology

## 2018-12-18 ENCOUNTER — Ambulatory Visit: Payer: BC Managed Care – PPO | Admitting: Obstetrics and Gynecology

## 2018-12-18 NOTE — Progress Notes (Deleted)
GYNECOLOGY  VISIT   HPI: 33 y.o.   Single  African American  female   [phone] with No LMP recorded.   here for     GYNECOLOGIC HISTORY: No LMP recorded. Contraception: ***Withdrawal Menopausal hormone therapy:  n/a Last mammogram: n/a Last pap smear: 05-26-18 LSIL:Pos HR HPV,7/6/20high grade dysplasia, CIN II, with positive margins, ECC benign; 06/27/18 LSIL;06/19/18 Colpo showed high gradedysplasia                 OB History    Gravida  3   Para  2   Term  2   Preterm  0   AB  1   Living  2     SAB  0   TAB  1   Ectopic  0   Multiple  0   Live Births  2              Patient Active Problem List   Diagnosis Date Noted  . Female bladder prolapse 08/04/2018  . Rectocele 08/04/2018  . GERD (gastroesophageal reflux disease) 03/09/2014  . Altered bowel habits 03/09/2014  . Bloating 03/09/2014  . Helicobacter pylori ab+ 03/09/2014  . 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 Menorrhagia 03/29/2013  . Encounter for counseling 03/29/2013  . Abdominal hernia 09/29/2011  . 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 Abdominal hernia   . Abnormal Pap smear of cervix 2005   HPV +, 05-26-2018 LGSIL HPV HR+  . Anemia   . Anxiety   . Chlamydia 08/18/2015   repeat testing negative October 2017.  Marland Kitchen Condyloma acuminatum of vulva   . Depression    but doesn't require meds  . GERD (gastroesophageal reflux disease)    only takes OTC meds  . History of bronchitis 3+yrs ago  . History of migraine 09/20/11-last one  . HSV-1 (herpes simplex virus 1) infection   . HSV-2 (herpes simplex virus 2) infection   . Hyperlipidemia    postpartum 2+yrs ago  . Hypertension    postpartum 2+yrs ago  . Low vitamin D level 2018  . Migraine    with aura  . Sickle cell trait ([place])   . Sleeping difficulty   . Trichomonas infection 09/2016    Past Surgical History:  Procedure Laterality Date  . ABDOMINOPLASTY  09/29/2011   Procedure:  ABDOMINOPLASTY;  Surgeon: Theodoro Kos, DO;  Location: [facility];  Service: Plastics;  Laterality: N/A;  ABDOMINOPLASTY FOR REPAIR OF RECTUS DIASTASIS  . EYE SURGERY  1993  . INTRAUTERINE DEVICE (IUD) INSERTION     10-31-13 mirena iud inserted  . 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   . OVARIAN CYST REMOVAL  2012  . VENTRAL HERNIA REPAIR  09/29/2011   Procedure: HERNIA REPAIR VENTRAL ADULT;  Surgeon: Joyice Faster. Cornett, MD;  Location: [facility];  Service: General;  Laterality: N/A;    Current Outpatient Medications  Medication Sig Dispense Refill  . acetaminophen (TYLENOL) 500 MG tablet Take 1,000 mg by mouth every 6 (six) hours as needed. For pain    . aspirin-acetaminophen-caffeine (EXCEDRIN MIGRAINE) 250-250-65 MG per tablet Take 1 tablet by mouth daily as needed. For migraines    . fluticasone (FLONASE) 50 MCG/ACT nasal spray as needed.  0  . ibuprofen (ADVIL) 800 MG tablet Take 1 tablet (800 mg total) by mouth every 8 (eight) hours as needed. 30 tablet 0  . NONFORMULARY OR COMPOUNDED ITEM Boric acid vaginal suppositories  600 mg. Place 1 suppository vaginally nightly for 21 days.  Dispense: 21; No refills 21 each 0  . norethindrone (AYGESTIN) 5 MG tablet Take 1 tablet (5 mg total) by mouth 3 (three) times daily. 90 tablet 1  . omeprazole (PRILOSEC) 40 MG capsule Take 1 capsule by mouth as needed.  0  . ondansetron (ZOFRAN-ODT) 4 MG disintegrating tablet Take 1 tablet by mouth as needed.  0  . sertraline (ZOLOFT) 50 MG tablet TK 1 T PO QD     No current facility-administered medications for this visit.      ALLERGIES: Other, Percocet [oxycodone-acetaminophen], and Valtrex [valacyclovir hcl]  Family History  Problem Relation Age of Onset  . Diabetes Mother   . Hypertension Mother   . Hypertension Father     Social History   Socioeconomic History  . Marital status: Single    Spouse name: Not on file  . Number of children: 2  . Years of education: Not on file  . Highest  education level: Not on file  Occupational History  . Not on file  Social Needs  . Financial resource strain: Not on file  . Food insecurity    Worry: Not on file    Inability: Not on file  . Transportation needs    Medical: Not on file    Non-medical: Not on file  Tobacco Use  . Smoking status: Former Smoker    Types: Cigars    Quit date: 09/21/2006    Years since quitting: 12.2  . Smokeless tobacco: Never Used  Substance and Sexual Activity  . Alcohol use: Not Currently    Alcohol/week: 0.0 standard drinks    Comment: occas  . Drug use: No  . Sexual activity: Yes    Partners: Male    Birth control/protection: Pill    Comment: Micronor  Lifestyle  . Physical activity    Days per week: Not on file    Minutes per session: Not on file  . Stress: Not on file  Relationships  . Social Herbalist on phone: Not on file    Gets together: Not on file    Attends religious service: Not on file    Active member of club or organization: Not on file    Attends meetings of clubs or organizations: Not on file    Relationship status: Not on file  . Intimate partner violence    Fear of current or ex partner: Not on file    Emotionally abused: Not on file    Physically abused: Not on file    Forced sexual activity: Not on file  Other Topics Concern  . Not on file  Social History Narrative  . Not on file    Review of Systems  PHYSICAL EXAMINATION:    There were no vitals taken for this visit.    General appearance: alert, cooperative and appears stated age Head: Normocephalic, without obvious abnormality, atraumatic Neck: no adenopathy, supple, symmetrical, trachea midline and thyroid normal to inspection and palpation Lungs: clear to auscultation bilaterally Breasts: normal appearance, no masses or tenderness, No nipple retraction or dimpling, No nipple discharge or bleeding, No axillary or supraclavicular adenopathy Heart: regular rate and rhythm Abdomen: soft,  non-tender, no masses,  no organomegaly Extremities: extremities normal, atraumatic, no cyanosis or edema Skin: Skin color, texture, turgor normal. No rashes or lesions Lymph nodes: Cervical, supraclavicular, and axillary nodes normal. No abnormal inguinal nodes palpated Neurologic: Grossly normal  Pelvic: External genitalia:  no lesions              Urethra:  normal appearing urethra with no masses, tenderness or lesions              Bartholins and Skenes: normal                 Vagina: normal appearing vagina with normal color and discharge, no lesions              Cervix: no lesions                Bimanual Exam:  Uterus:  normal size, contour, position, consistency, mobility, non-tender              Adnexa: no mass, fullness, tenderness              Rectal exam: {yes no:314532}.  Confirms.              Anus:  normal sphincter tone, no lesions  Chaperone was present for exam.  ASSESSMENT     PLAN     An After Visit Summary was printed and given to the patient.  ______ minutes face to face time of which over 50% was spent in counseling.

## 2018-12-18 NOTE — Telephone Encounter (Signed)
Patient [facility] today's appointment. Left message on voicemail to reschedule. Letter sent.

## 2018-12-18 NOTE — Telephone Encounter (Signed)
Message sent to triage to assist in rescheduling follow up with me. Patient is in pap recall and needs a recheck for a positive margin on her LEEP specimen.  Cc- Nita Sickle.

## 2018-12-19 NOTE — Telephone Encounter (Signed)
Spoke with pt. Pt rescheduled 11/5 at 3pm with Dr Quincy Simmonds for LEEP follow up. Pt verbalized understanding.  Routing to provider for final review. Will close encounter.

## 2018-12-21 ENCOUNTER — Ambulatory Visit: Payer: BC Managed Care – PPO | Admitting: Obstetrics and Gynecology

## 2018-12-21 ENCOUNTER — Encounter: Payer: Self-pay | Admitting: Obstetrics and Gynecology

## 2018-12-21 ENCOUNTER — Other Ambulatory Visit: Payer: Self-pay

## 2018-12-21 ENCOUNTER — Other Ambulatory Visit (HOSPITAL_COMMUNITY)
Admission: RE | Admit: 2018-12-21 | Discharge: 2018-12-21 | Disposition: A | Discharge status: Home / Self Care | Payer: BC Managed Care – PPO | Source: Ambulatory Visit | Attending: Obstetrics and Gynecology | Admitting: Obstetrics and Gynecology

## 2018-12-21 VITALS — BP 140/82 | HR 70 | Temp 97.5°F | Ht 62.0 in | Wt 162.4 lb

## 2018-12-21 DIAGNOSIS — N393 Stress incontinence (female) (male): Secondary | ICD-10-CM | POA: Diagnosis not present

## 2018-12-21 DIAGNOSIS — N871 Moderate cervical dysplasia: Secondary | ICD-10-CM | POA: Diagnosis present

## 2018-12-21 DIAGNOSIS — N921 Excessive and frequent menstruation with irregular cycle: Secondary | ICD-10-CM | POA: Diagnosis not present

## 2018-12-21 MED ORDER — NORETHINDRONE ACETATE 5 MG PO TABS: 2.5000 mg | ORAL_TABLET | Freq: Two times a day (BID) | ORAL | 1 refills | Status: DC

## 2018-12-21 NOTE — Progress Notes (Signed)
GYNECOLOGY  VISIT   HPI: 33 y.o.   Single  African American  female   [phone] with Patient's last menstrual period was 11/30/2018 (approximate).   here for follow up.  She is due for a pap and HR HPV testing.   She had a LEEP 08/21/18 and dx with CIN II with a positive margin.   Taking Aygestin for abnormal uterine bleeding, menometrorrhagia. Normal pelvic US and EMB. Has tried Mirena IUD, Depo Provera, Micronor. Patient feels Aygestin is causing weight gain. She has gained 16lbs since end of August. Increased appetite.  She is taking Aygestin bid, not tid.  She had more hot flashes and night sweats with taking it three times daily.  Patient holding off on hysterectomy at this time.  Still has some urinary incontinence.   Found a lubricant which she really likes, [place] premium.   GYNECOLOGIC HISTORY: Patient's last menstrual period was 11/30/2018 (approximate). Contraception: withdrawal/?Aygestin Menopausal hormone therapy:  n/a Last mammogram:  n/a Last pap smear:   05-26-18 LSIL:Pos HR HPV,7/6/20high grade dysplasia, CIN II, with positive margins, ECC benign; 06/27/18 LSIL;06/19/18 Colpo showed high gradedysplasia         OB History    Gravida  3   Para  2   Term  2   Preterm  0   AB  1   Living  2     SAB  0   TAB  1   Ectopic  0   Multiple  0   Live Births  2              Patient Active Problem List   Diagnosis Date Noted  . Female bladder prolapse 08/04/2018  . Rectocele 08/04/2018  . GERD (gastroesophageal reflux disease) 03/09/2014  . Altered bowel habits 03/09/2014  . Bloating 03/09/2014  . Helicobacter pylori ab+ 03/09/2014  . 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 Menorrhagia 03/29/2013  . Encounter for counseling 03/29/2013  . Abdominal hernia 09/29/2011  . 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 Abdominal hernia   . Abnormal Pap smear of cervix 2005   HPV +, 05-26-2018 LGSIL HPV HR+  . Anemia    . Anxiety   . Chlamydia 08/18/2015   repeat testing negative October 2017.  Marland Kitchen Condyloma acuminatum of vulva   . Depression    but doesn't require meds  . GERD (gastroesophageal reflux disease)    only takes OTC meds  . History of bronchitis 3+yrs ago  . History of migraine 09/20/11-last one  . HSV-1 (herpes simplex virus 1) infection   . HSV-2 (herpes simplex virus 2) infection   . Hyperlipidemia    postpartum 2+yrs ago  . Hypertension    postpartum 2+yrs ago  . Low vitamin D level 2018  . Migraine    with aura  . Sickle cell trait ([place])   . Sleeping difficulty   . Trichomonas infection 09/2016    Past Surgical History:  Procedure Laterality Date  . ABDOMINOPLASTY  09/29/2011   Procedure: ABDOMINOPLASTY;  Surgeon: Theodoro Kos, DO;  Location: [facility];  Service: Plastics;  Laterality: N/A;  ABDOMINOPLASTY FOR REPAIR OF RECTUS DIASTASIS  . EYE SURGERY  1993  . INTRAUTERINE DEVICE (IUD) INSERTION     10-31-13 mirena iud inserted  . 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   . OVARIAN CYST REMOVAL  2012  . VENTRAL HERNIA REPAIR  09/29/2011   Procedure: HERNIA REPAIR VENTRAL  ADULT;  Surgeon: Joyice Faster. Cornett, MD;  Location: [facility];  Service: General;  Laterality: N/A;    Current Outpatient Medications  Medication Sig Dispense Refill  . acetaminophen (TYLENOL) 500 MG tablet Take 1,000 mg by mouth every 6 (six) hours as needed. For pain    . aspirin-acetaminophen-caffeine (EXCEDRIN MIGRAINE) 250-250-65 MG per tablet Take 1 tablet by mouth daily as needed. For migraines    . fluticasone (FLONASE) 50 MCG/ACT nasal spray as needed.  0  . ibuprofen (ADVIL) 800 MG tablet Take 1 tablet (800 mg total) by mouth every 8 (eight) hours as needed. 30 tablet 0  . NONFORMULARY OR COMPOUNDED ITEM Boric acid vaginal suppositories 600 mg. Place 1 suppository vaginally nightly for 21 days.  Dispense: 21; No refills 21 each 0  . norethindrone (AYGESTIN) 5 MG tablet Take 0.5 tablets (2.5 mg total) by  mouth 2 (two) times daily. 90 tablet 1  . omeprazole (PRILOSEC) 40 MG capsule Take 1 capsule by mouth as needed.  0  . ondansetron (ZOFRAN-ODT) 4 MG disintegrating tablet Take 1 tablet by mouth as needed.  0  . sertraline (ZOLOFT) 50 MG tablet TK 1 T PO QD     No current facility-administered medications for this visit.      ALLERGIES: Other, Percocet [oxycodone-acetaminophen], and Valtrex [valacyclovir hcl]  Family History  Problem Relation Age of Onset  . Diabetes Mother   . Hypertension Mother   . Hypertension Father     Social History   Socioeconomic History  . Marital status: Single    Spouse name: Not on file  . Number of children: 2  . Years of education: Not on file  . Highest education level: Not on file  Occupational History  . Not on file  Social Needs  . Financial resource strain: Not on file  . Food insecurity    Worry: Not on file    Inability: Not on file  . Transportation needs    Medical: Not on file    Non-medical: Not on file  Tobacco Use  . Smoking status: Former Smoker    Types: Cigars    Quit date: 09/21/2006    Years since quitting: 12.2  . Smokeless tobacco: Never Used  Substance and Sexual Activity  . Alcohol use: Not Currently    Alcohol/week: 0.0 standard drinks    Comment: occas  . Drug use: No  . Sexual activity: Yes    Partners: Male    Birth control/protection: Pill    Comment: Micronor  Lifestyle  . Physical activity    Days per week: Not on file    Minutes per session: Not on file  . Stress: Not on file  Relationships  . Social Herbalist on phone: Not on file    Gets together: Not on file    Attends religious service: Not on file    Active member of club or organization: Not on file    Attends meetings of clubs or organizations: Not on file    Relationship status: Not on file  . Intimate partner violence    Fear of current or ex partner: Not on file    Emotionally abused: Not on file    Physically abused: Not  on file    Forced sexual activity: Not on file  Other Topics Concern  . Not on file  Social History Narrative  . Not on file    Review of Systems  Constitutional: Positive for unexpected weight change (  weight gain).  All other systems reviewed and are negative.   PHYSICAL EXAMINATION:    BP 140/82   Pulse 70   Temp (!) 97.5 F (36.4 C) (Temporal)   Ht 5\' 2"  (1.575 m)   Wt 162 lb 6.4 oz (73.7 kg)   LMP 11/30/2018 (Approximate)   BMI 29.70 kg/m     General appearance: alert, cooperative and appears stated age   Pelvic: External genitalia:  no lesions              Urethra:  normal appearing urethra with no masses, tenderness or lesions              Bartholins and Skenes: normal                 Vagina: normal appearing vagina with normal color and discharge, no lesions              Cervix: no lesions.  Consistent with LEEP.                Bimanual Exam:  Uterus:  normal size, contour, position, consistency, mobility, non-tender              Adnexa: no mass, fullness, tenderness         Chaperone was present for exam.  ASSESSMENT  CIN with positive margin on LEEP.  Stress incontinence.  Abnormal uterine bleeding.   PLAN  Pap and HR HPV Reduce Aygestin to 2.5 mg po bid. Kegel's.    An After Visit Summary was printed and given to the patient.  _15____ minutes face to face time of which over 50% was spent in counseling.

## 2018-12-27 LAB — CYTOLOGY - PAP
Comment: NEGATIVE
Diagnosis: NEGATIVE
High risk HPV: POSITIVE — AB

## 2019-01-01 ENCOUNTER — Telehealth: Payer: Self-pay

## 2019-01-01 DIAGNOSIS — N871 Moderate cervical dysplasia: Secondary | ICD-10-CM

## 2019-01-01 NOTE — Telephone Encounter (Signed)
Spoke with pt. Pt updated on pap results. Pt aware of needing repeat colpo per Dr Elza Rafter recommendation and ASCCP guidelines and LEEP hx. Pt scheduled for repeat colpo on 01/03/19 at 2:30pm with Dr Quincy Simmonds. Motrin 800 mg with food and water one hour before procedure. Will route to [place] for benefits. Pt aware. Routing to provider for final review. Patient is agreeable to disposition. Will close encounter.  CC: Weston Brass- future orders placed

## 2019-01-02 ENCOUNTER — Telehealth: Payer: Self-pay | Admitting: Obstetrics and Gynecology

## 2019-01-02 DIAGNOSIS — N871 Moderate cervical dysplasia: Secondary | ICD-10-CM

## 2019-01-02 NOTE — Telephone Encounter (Signed)
Patient cancelled colposcopy due to heavy bleeding. Would like to reschedule.

## 2019-01-02 NOTE — Telephone Encounter (Signed)
Left message to call triage  RN at [facility].

## 2019-01-02 NOTE — Telephone Encounter (Signed)
Call placed to convey benefits for colposcopy. Spoke with patient and conveyed benefits. Patient understands/agreeable with the benefits. Patient is aware of the cancellation policy. Appointment scheduled 01/03/19.

## 2019-01-03 ENCOUNTER — Ambulatory Visit: Payer: Self-pay | Admitting: Obstetrics and Gynecology

## 2019-01-16 NOTE — Telephone Encounter (Signed)
Spoke to pt. Pt rescheduled colpo from 01/03/19 due to heavy bleeding. Now scheduled 01/31/19 at 2pm with Dr Quincy Simmonds.   Will route to Dr Quincy Simmonds for final review and close encounter.  Cc: Weston Brass for precert, orders placed.

## 2019-01-16 NOTE — Telephone Encounter (Signed)
Spoke with patient. Colpo r/s to 12/8 at 10:30am with Dr. Quincy Simmonds.   Routing to provider for final review. Patient is agreeable to disposition. Will close encounter.  Cc: Lerry Liner, [name]

## 2019-01-16 NOTE — Telephone Encounter (Signed)
I recommend changing to date different from 01/31/19.  I expect she could be on her period again if this is one month after her last cycle?

## 2019-01-19 ENCOUNTER — Other Ambulatory Visit: Payer: Self-pay

## 2019-01-22 ENCOUNTER — Telehealth: Payer: Self-pay | Admitting: Obstetrics and Gynecology

## 2019-01-22 NOTE — Telephone Encounter (Signed)
Spoke with pt. 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 Pt rescheduled colpo for 02/28/2019 at 4pm. Pt agreeable.   Will route to Dr Quincy Simmonds for final review and will close encounter.

## 2019-01-22 NOTE — Telephone Encounter (Signed)
Patient is calling to speak with a nurse regarding cancelling her colpo tomorrow.

## 2019-01-23 ENCOUNTER — Ambulatory Visit: Payer: Self-pay | Admitting: Obstetrics and Gynecology

## 2019-01-31 ENCOUNTER — Other Ambulatory Visit: Payer: Self-pay | Admitting: Obstetrics and Gynecology

## 2019-01-31 ENCOUNTER — Ambulatory Visit: Payer: Self-pay | Admitting: Obstetrics and Gynecology

## 2019-02-01 ENCOUNTER — Other Ambulatory Visit: Payer: Self-pay | Admitting: Obstetrics and Gynecology

## 2019-02-01 NOTE — Telephone Encounter (Signed)
Medication refill request: Aygestin  Last AEX:  05-26-2018 Next AEX: not currently scheduled Last MMG (if hormonal medication request): n/a Refill authorized: Today, please advise.   Call to patient. Patient states she has contacted pharmacy and prescription for Aygestin sent on 12-21-2018 has been "closed out" because patient never picked up. Patient states she is just finishing up the bottle she picked up in October. Asking for refill. RN advised would send request to Dr. Quincy Simmonds for review. Patient agreeable. Pharmacy on file confirmed.   Medication pended for #90,1RF. Please refill if appropriate.

## 2019-02-01 NOTE — Telephone Encounter (Signed)
Patient calling to request refill on Aygestin. [phone]

## 2019-02-01 NOTE — Telephone Encounter (Signed)
See refill encounter dated 01-02-2019.   Encounter closed.

## 2019-02-03 ENCOUNTER — Encounter (HOSPITAL_COMMUNITY): Payer: Self-pay | Admitting: Urgent Care

## 2019-02-03 ENCOUNTER — Ambulatory Visit (HOSPITAL_COMMUNITY)
Admission: EM | Admit: 2019-02-03 | Discharge: 2019-02-03 | Disposition: A | Discharge status: Home / Self Care | Payer: BC Managed Care – PPO | Attending: Urgent Care | Admitting: Urgent Care

## 2019-02-03 ENCOUNTER — Other Ambulatory Visit: Payer: Self-pay

## 2019-02-03 DIAGNOSIS — Z20822 Contact with and (suspected) exposure to covid-19: Secondary | ICD-10-CM

## 2019-02-03 DIAGNOSIS — Z20828 Contact with and (suspected) exposure to other viral communicable diseases: Secondary | ICD-10-CM | POA: Insufficient documentation

## 2019-02-03 NOTE — ED Notes (Signed)
Seen by Bess Harvest, pa

## 2019-02-03 NOTE — Discharge Instructions (Signed)
We will notify you of your COVID-19 test results as they arrive and may take between 2 to 7 days.  In the meantime, if you develop worsening symptoms including fever, chest pain, shortness of breath despite our current treatment plan then please report to the emergency room as this may be a sign of worsening status from possible COVID-19 infection.

## 2019-02-03 NOTE — ED Provider Notes (Signed)
Center   MRN: [id number] DOB: 31-Dec-1985  Subjective:   Toni Warren is a 33 y.o. female presenting for COVID 19 testing. No exposures to her knowledge, wants a general screening. No sx.   No current facility-administered medications for this encounter.  Current Outpatient Medications:  .  acetaminophen (TYLENOL) 500 MG tablet, Take 1,000 mg by mouth every 6 (six) hours as needed. For pain, Disp: , Rfl:  .  aspirin-acetaminophen-caffeine (EXCEDRIN MIGRAINE) 250-250-65 MG per tablet, Take 1 tablet by mouth daily as needed. For migraines, Disp: , Rfl:  .  fluticasone (FLONASE) 50 MCG/ACT nasal spray, as needed., Disp: , Rfl: 0 .  ibuprofen (ADVIL) 800 MG tablet, Take 1 tablet (800 mg total) by mouth every 8 (eight) hours as needed., Disp: 30 tablet, Rfl: 0 .  NONFORMULARY OR COMPOUNDED ITEM, Boric acid vaginal suppositories 600 mg. Place 1 suppository vaginally nightly for 21 days.  Dispense: 21; No refills, Disp: 21 each, Rfl: 0 .  norethindrone (AYGESTIN) 5 MG tablet, Take 1/2 tablet (2.5 mg) by mouth twice daily., Disp: 30 tablet, Rfl: 1 .  omeprazole (PRILOSEC) 40 MG capsule, Take 1 capsule by mouth as needed., Disp: , Rfl: 0 .  ondansetron (ZOFRAN-ODT) 4 MG disintegrating tablet, Take 1 tablet by mouth as needed., Disp: , Rfl: 0 .  sertraline (ZOLOFT) 50 MG tablet, TK 1 T PO QD, Disp: , Rfl:    Allergies  Allergen Reactions  . Other Itching    Latex condoms  . Percocet [Oxycodone-Acetaminophen] Itching  . Valtrex [Valacyclovir Hcl]     Headache and nausea.    Past Medical History:  Diagnosis Date  . Abdominal hernia   . Abnormal Pap smear of cervix 2005   HPV +, 05-26-2018 LGSIL HPV HR+  . Anemia   . Anxiety   . Chlamydia 08/18/2015   repeat testing negative October 2017.  Marland Kitchen Condyloma acuminatum of vulva   . Depression    but doesn't require meds  . GERD (gastroesophageal reflux disease)    only takes OTC meds  . History of bronchitis 3+yrs ago  .  History of migraine 09/20/11-last one  . HSV-1 (herpes simplex virus 1) infection   . HSV-2 (herpes simplex virus 2) infection   . Hyperlipidemia    postpartum 2+yrs ago  . Hypertension    postpartum 2+yrs ago  . Low vitamin D level 2018  . Migraine    with aura  . Sickle cell trait ([place])   . Sleeping difficulty   . Trichomonas infection 09/2016     Past Surgical History:  Procedure Laterality Date  . ABDOMINOPLASTY  09/29/2011   Procedure: ABDOMINOPLASTY;  Surgeon: Theodoro Kos, DO;  Location: [facility];  Service: Plastics;  Laterality: N/A;  ABDOMINOPLASTY FOR REPAIR OF RECTUS DIASTASIS  . EYE SURGERY  1993  . INTRAUTERINE DEVICE (IUD) INSERTION     10-31-13 mirena iud inserted  . 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   . OVARIAN CYST REMOVAL  2012  . VENTRAL HERNIA REPAIR  09/29/2011   Procedure: HERNIA REPAIR VENTRAL ADULT;  Surgeon: Joyice Faster. Cornett, MD;  Location: [facility];  Service: General;  Laterality: N/A;    Family History  Problem Relation Age of Onset  . Diabetes Mother   . Hypertension Mother   . Hypertension Father     Social History   Tobacco Use  . Smoking status: Former Smoker    Types: Cigars    Quit date: 09/21/2006  Years since quitting: 12.3  . Smokeless tobacco: Never Used  Substance Use Topics  . Alcohol use: Not Currently    Alcohol/week: 0.0 standard drinks    Comment: occas  . Drug use: No    Review of Systems  Constitutional: Negative for fever and malaise/fatigue.  HENT: Negative for congestion, ear pain, sinus pain and sore throat.   Eyes: Negative for discharge and redness.  Respiratory: Negative for cough, hemoptysis, shortness of breath and wheezing.   Cardiovascular: Negative for chest pain.  Gastrointestinal: Negative for abdominal pain, diarrhea, nausea and vomiting.  Genitourinary: Negative for dysuria, flank pain and hematuria.  Musculoskeletal: Negative for myalgias.  Skin: Negative for rash.  Neurological: Negative for  dizziness, weakness and headaches.  Psychiatric/Behavioral: Negative for depression and substance abuse.     Objective:   Vitals: BP 133/73   Pulse 72   Temp 98.8 F (37.1 C) (Oral)   Resp 16   SpO2 99%   Physical Exam Constitutional:      General: She is not in acute distress.    Appearance: Normal appearance. She is well-developed. She is not ill-appearing.  HENT:     Head: Normocephalic and atraumatic.     Nose: Nose normal.     Mouth/Throat:     Mouth: Mucous membranes are moist.     Pharynx: Oropharynx is clear.  Eyes:     General: No scleral icterus.    Extraocular Movements: Extraocular movements intact.     Pupils: Pupils are equal, round, and reactive to light.  Cardiovascular:     Rate and Rhythm: Normal rate.  Pulmonary:     Effort: Pulmonary effort is normal.  Skin:    General: Skin is warm and dry.  Neurological:     General: No focal deficit present.     Mental Status: She is alert and oriented to person, place, and time.  Psychiatric:        Mood and Affect: Mood normal.        Behavior: Behavior normal.      Assessment and Plan :   1. Exposure to COVID-19 virus     Counseled patient on nature of COVID-19 including modes of transmission, diagnostic testing, management and supportive care.  Counseled on medications used for symptomatic relief. COVID 19 testing is pending. Counseled patient on potential for adverse effects with medications prescribed/recommended today, ER and return-to-clinic precautions discussed, patient verbalized understanding.     Jaynee Eagles, [place] 02/03/19 [id number]

## 2019-02-03 NOTE — ED Triage Notes (Signed)
Seen  by provider only

## 2019-02-04 LAB — NOVEL CORONAVIRUS, NAA (HOSP ORDER, SEND-OUT TO REF LAB; TAT 18-24 HRS): SARS-CoV-2, NAA: NOT DETECTED

## 2019-02-14 ENCOUNTER — Ambulatory Visit
Admission: EM | Admit: 2019-02-14 | Discharge: 2019-02-14 | Discharge status: Absent without leave | Payer: BC Managed Care – PPO

## 2019-02-14 ENCOUNTER — Other Ambulatory Visit: Payer: Self-pay

## 2019-02-28 ENCOUNTER — Ambulatory Visit: Payer: BC Managed Care – PPO | Admitting: Obstetrics and Gynecology

## 2019-02-28 ENCOUNTER — Other Ambulatory Visit: Payer: Self-pay

## 2019-02-28 ENCOUNTER — Encounter: Payer: Self-pay | Admitting: Obstetrics and Gynecology

## 2019-02-28 ENCOUNTER — Other Ambulatory Visit (HOSPITAL_COMMUNITY)
Admission: RE | Admit: 2019-02-28 | Discharge: 2019-02-28 | Disposition: A | Discharge status: Home / Self Care | Payer: BC Managed Care – PPO | Source: Ambulatory Visit | Attending: Obstetrics and Gynecology | Admitting: Obstetrics and Gynecology

## 2019-02-28 VITALS — BP 110/82 | HR 64 | Temp 97.1°F | Ht 62.0 in | Wt 169.6 lb

## 2019-02-28 DIAGNOSIS — N3281 Overactive bladder: Secondary | ICD-10-CM

## 2019-02-28 DIAGNOSIS — R87810 Cervical high risk human papillomavirus (HPV) DNA test positive: Secondary | ICD-10-CM | POA: Insufficient documentation

## 2019-02-28 DIAGNOSIS — R102 Pelvic and perineal pain: Secondary | ICD-10-CM

## 2019-02-28 DIAGNOSIS — Z01812 Encounter for preprocedural laboratory examination: Secondary | ICD-10-CM | POA: Diagnosis not present

## 2019-02-28 DIAGNOSIS — N939 Abnormal uterine and vaginal bleeding, unspecified: Secondary | ICD-10-CM

## 2019-02-28 LAB — POCT URINE PREGNANCY: Preg Test, Ur: NEGATIVE

## 2019-02-28 MED ORDER — OXYBUTYNIN CHLORIDE ER 10 MG PO TB24: 10.0000 mg | ORAL_TABLET | Freq: Every day | ORAL | 2 refills | Status: DC

## 2019-02-28 MED ORDER — NORETHINDRONE ACETATE 5 MG PO TABS: 5.0000 mg | ORAL_TABLET | Freq: Every day | ORAL | 0 refills | Status: DC

## 2019-02-28 NOTE — Patient Instructions (Signed)
Colposcopy, Care After This sheet gives you information about how to care for yourself after your procedure. Your doctor may also give you more specific instructions. If you have problems or questions, contact your doctor. What can I expect after the procedure? If you did not have a tissue sample removed (did not have a biopsy), you may only have some spotting for a few days. You can go back to your normal activities. If you had a tissue sample removed, it is common to have:  Soreness and pain. This may last for a few days.  Light-headedness.  Mild bleeding from your vagina or dark-colored, grainy discharge from your vagina. This may last for a few days. You may need to wear a sanitary pad.  Spotting for at least 48 hours after the procedure. Follow these instructions at home:   Take over-the-counter and prescription medicines only as told by your doctor. Ask your doctor what medicines you can start taking again. This is very important if you take blood-thinning medicine.  Do not drive or use heavy machinery while taking prescription pain medicine.  For 3 days, or as long as your doctor tells you, avoid: ? Douching. ? Using tampons. ? Having sex.  If you use birth control (contraception), keep using it.  Limit activity for the first day after the procedure. Ask your doctor what activities are safe for you.  It is up to you to get the results of your procedure. Ask your doctor when your results will be ready.  Keep all follow-up visits as told by your doctor. This is important. Contact a doctor if:  You get a skin rash. Get help right away if:  You are bleeding a lot from your vagina. It is a lot of bleeding if you are using more than one pad an hour for 2 hours in a row.  You have clumps of blood (blood clots) coming from your vagina.  You have a fever.  You have chills  You have pain in your lower belly (pelvic area).  You have signs of infection, such as vaginal  discharge that is: ? Different than usual. ? Yellow. ? Bad-smelling.  You have very pain or cramps in your lower belly that do not get better with medicine.  You feel light-headed.  You feel dizzy.  You pass out (faint). Summary  If you did not have a tissue sample removed (did not have a biopsy), you may only have some spotting for a few days. You can go back to your normal activities.  If you had a tissue sample removed, it is common to have mild pain and spotting for 48 hours.  For 3 days, or as long as your doctor tells you, avoid douching, using tampons and having sex.  Get help right away if you have bleeding, very bad pain, or signs of infection. This information is not intended to replace advice given to you by your health care provider. Make sure you discuss any questions you have with your health care provider. Document Revised: 01/14/2017 Document Reviewed: 10/22/2015 Elsevier Patient Education  2020 Elsevier Inc.  

## 2019-02-28 NOTE — Progress Notes (Signed)
  Subjective:     Patient ID: Toni Warren, female   DOB: May 27, 1985, 34 y.o.   MRN: [id number]  HPI  Patient here today for colposcopy with pap 12-21-18 Neg:Pos HR HPV.  Pap history: 05-26-18 LSIL:Pos HR HPV,08/21/18 LEEPhigh grade dysplasia, CIN II, with positive margins, ECC benign; 06/27/18 LSIL;06/19/18 Colpo showed high gradedysplasia,06-05-18 LGSIL:Pos HR HPV, 08-15-15 Neg:Neg HR HPV   Feeling hot and cold all the time.  Not sleeping well.  TSH 1.230 on 10/25/18.  No bleeding until spotting today.  She is on Aygestin 5 mg daily.  It really helps to treat her pelvic pain and menstrual bleeding.  She ran out and did have heavy bleeding and pain.   Not using contraception other than withdrawal.  Just started on her Sertraline a few days ago.   Getting up every 2 hours to void during the night.    Had migraine last hs.  Has aura prior to migraine.  Review of Systems  All other systems reviewed and are negative.   LMP: 11/2018 Contraception:None--on Aygestin 5mg  qd UPT: Neg     Objective:   Physical Exam No lesions noted.  Exam consistent with prior LEEP.    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  Current pap normal with positive HR HPV.  Migraine with aura. Overactive bladder. Hx pelvic pain and abnormal uterine bleeding.     Plan:     FU ECC from colposcopy today.  Post colpo instructions given.  Oxybutynin XL 10 mg take at hs.  Avoid bladder irritants. Continue Aygestin 5 mg daily.  She knows this is not contraception.  Continue Sertraline.  FU for annual exam in 3 months.   __15_____ minutes face to face time of which over 50% was spent in counseling.

## 2019-03-07 LAB — SURGICAL PATHOLOGY

## 2019-03-15 ENCOUNTER — Telehealth: Payer: Self-pay | Admitting: *Deleted

## 2019-03-15 NOTE — Telephone Encounter (Signed)
Patient states she is currently on Zoloft 50 mg daily, has discussed with Dr. Quincy Simmonds adding Wellbutirn. Patient is asking of Dr. Quincy Simmonds will send RX? Confirmed pharmacy on file.    Patient is due for next AEX 05/26/18. Patient asking if pap will be repeated then? Will she need to return for separate OV 02/2020 for pap only?   Routing to Dr. Quincy Simmonds to review and advise.

## 2019-03-15 NOTE — Telephone Encounter (Signed)
-----   Message from Nunzio Cobbs, MD sent at 03/14/2019  9:26 AM EST ----- Please contact patient in follow up to her colposcopy showing atypia of the squamous cells.   The endocervical cells are benign.  She has already had a LEEP procedure and did have a positive margin.  Dx CIN II. She has been choosing to follow this conservatively.   I recommend she has her next cervical cancer screening in 12 months.  Please place her in recall.

## 2019-03-16 NOTE — Telephone Encounter (Signed)
I would recommend pushing her annual exam forward to November, 2021 and doing her next pap at that time.  Please have her contact her provider who is prescribing the Zoloft.  That person will need to adjust her medication.  If that is not possible, I would like to have at least a My Chart video visit with her to discuss this further and help her best.

## 2019-03-19 NOTE — Telephone Encounter (Signed)
Left message to call Estil Vallee, RN at [facility] [phone].   

## 2019-03-21 NOTE — Telephone Encounter (Signed)
Pt calling back from 2/1. Pt has more questions for Dr Quincy Simmonds about taking Zoloft and possibly adding Wellbutrin. Pt scheduled for Mychart visit on 03/22/2019 at 1130. Pt agreeable and aware how to use mychart. '  Routing to Dr Quincy Simmonds for review and will close encounter.

## 2019-03-22 ENCOUNTER — Telehealth (INDEPENDENT_AMBULATORY_CARE_PROVIDER_SITE_OTHER): Payer: BC Managed Care – PPO | Admitting: Obstetrics and Gynecology

## 2019-03-22 ENCOUNTER — Other Ambulatory Visit: Payer: Self-pay

## 2019-03-22 ENCOUNTER — Encounter: Payer: Self-pay | Admitting: Obstetrics and Gynecology

## 2019-03-22 VITALS — BP 118/78 | HR 70 | Temp 97.2°F | Ht 62.0 in | Wt 175.0 lb

## 2019-03-22 DIAGNOSIS — F439 Reaction to severe stress, unspecified: Secondary | ICD-10-CM

## 2019-03-22 DIAGNOSIS — Z5181 Encounter for therapeutic drug level monitoring: Secondary | ICD-10-CM | POA: Diagnosis not present

## 2019-03-22 DIAGNOSIS — N939 Abnormal uterine and vaginal bleeding, unspecified: Secondary | ICD-10-CM | POA: Diagnosis not present

## 2019-03-22 MED ORDER — BUPROPION HCL ER (XL) 150 MG PO TB24: 150.0000 mg | ORAL_TABLET | Freq: Every day | ORAL | 2 refills | Status: DC

## 2019-03-22 NOTE — Progress Notes (Signed)
GYNECOLOGY  VISIT   HPI: 34 y.o.   Single  African American  female   [phone] with No LMP recorded.   here for review of medications.     Taking Zoloft 25 mg for life stress.  She feels great taking this.  She has noticed improvement overall.  She feels more mellowed out but too tired.  She has a decreased sexual respsonse.  She took Wellbutrin in the past for a short period of time and thinks she did well then but just did not accept the need to take the medication.   Taking Aygestin 2.5 mg for abnormal uterine bleeding.  She is having weight gain with no dietary changes.  She would like to get off the Aygestin.   She is happy to not have BV now that the Mirena is out.   She feels tired.  She has added Vit D.   GYNECOLOGIC HISTORY: No LMP recorded. Contraception: On Aygestin Menopausal hormone therapy:  n/a Last mammogram:  Last pap smear: 12-21-18 Neg:Pos HR HPV,05-26-18 LSIL:Pos HR HPV,7/6/20high grade dysplasia, CIN II, with positive margins, ECC benign; 06/27/18 LSIL;06/19/18 Colpo showed high gradedysplasia                 OB History    Gravida  3   Para  2   Term  2   Preterm  0   AB  1   Living  2     SAB  0   TAB  1   Ectopic  0   Multiple  0   Live Births  2              Patient Active Problem List   Diagnosis Date Noted  . Female bladder prolapse 08/04/2018  . Rectocele 08/04/2018  . GERD (gastroesophageal reflux disease) 03/09/2014  . Altered bowel habits 03/09/2014  . Bloating 03/09/2014  . Helicobacter pylori ab+ 03/09/2014  . 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 Menorrhagia 03/29/2013  . Encounter for counseling 03/29/2013  . Abdominal hernia 09/29/2011  . 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 Abdominal hernia   . Abnormal Pap smear of cervix 2005   HPV +, 05-26-2018 LGSIL HPV HR+  . Anemia   . Anxiety   . Chlamydia 08/18/2015   repeat testing negative October 2017.  Marland Kitchen  Condyloma acuminatum of vulva   . Depression    but doesn't require meds  . GERD (gastroesophageal reflux disease)    only takes OTC meds  . History of bronchitis 3+yrs ago  . History of migraine 09/20/11-last one  . HSV-1 (herpes simplex virus 1) infection   . HSV-2 (herpes simplex virus 2) infection   . Hyperlipidemia    postpartum 2+yrs ago  . Hypertension    postpartum 2+yrs ago  . Low vitamin D level 2018  . Migraine    with aura  . Sickle cell trait ([place])   . Sleeping difficulty   . Trichomonas infection 09/2016    Past Surgical History:  Procedure Laterality Date  . ABDOMINOPLASTY  09/29/2011   Procedure: ABDOMINOPLASTY;  Surgeon: Theodoro Kos, DO;  Location: [facility];  Service: Plastics;  Laterality: N/A;  ABDOMINOPLASTY FOR REPAIR OF RECTUS DIASTASIS  . EYE SURGERY  1993  . INTRAUTERINE DEVICE (IUD) INSERTION     10-31-13 mirena iud inserted  . 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   . OVARIAN CYST REMOVAL  2012  .  VENTRAL HERNIA REPAIR  09/29/2011   Procedure: HERNIA REPAIR VENTRAL ADULT;  Surgeon: Joyice Faster. Cornett, MD;  Location: [facility];  Service: General;  Laterality: N/A;    Current Outpatient Medications  Medication Sig Dispense Refill  . acetaminophen (TYLENOL) 500 MG tablet Take 1,000 mg by mouth every 6 (six) hours as needed. For pain    . aspirin-acetaminophen-caffeine (EXCEDRIN MIGRAINE) 250-250-65 MG per tablet Take 1 tablet by mouth daily as needed. For migraines    . ibuprofen (ADVIL) 800 MG tablet Take 1 tablet (800 mg total) by mouth every 8 (eight) hours as needed. 30 tablet 0  . norethindrone (AYGESTIN) 5 MG tablet Take 1 tablet (5 mg total) by mouth daily. 90 tablet 0  . omeprazole (PRILOSEC) 40 MG capsule Take 1 capsule by mouth as needed.  0  . sertraline (ZOLOFT) 50 MG tablet TK 1 T PO QD    . fluticasone (FLONASE) 50 MCG/ACT nasal spray as needed.  0  . ondansetron (ZOFRAN-ODT) 4 MG disintegrating tablet Take 1 tablet by mouth as needed.  0  .  oxybutynin (DITROPAN XL) 10 MG 24 hr tablet Take 1 tablet (10 mg total) by mouth at bedtime. (Patient not taking: Reported on 03/22/2019) 30 tablet 2   No current facility-administered medications for this visit.     ALLERGIES: Other, Percocet [oxycodone-acetaminophen], and Valtrex [valacyclovir hcl]  Family History  Problem Relation Age of Onset  . Diabetes Mother   . Hypertension Mother   . Hypertension Father     Social History   Socioeconomic History  . Marital status: Single    Spouse name: Not on file  . Number of children: 2  . Years of education: Not on file  . Highest education level: Not on file  Occupational History  . Not on file  Tobacco Use  . Smoking status: Former Smoker    Types: Cigars    Quit date: 09/21/2006    Years since quitting: 12.5  . Smokeless tobacco: Never Used  Substance and Sexual Activity  . Alcohol use: Not Currently    Alcohol/week: 0.0 standard drinks    Comment: occas  . Drug use: No  . Sexual activity: Yes    Partners: Male    Birth control/protection: Pill    Comment: Micronor  Other Topics Concern  . Not on file  Social History Narrative  . Not on file   Social Determinants of Health   Financial Resource Strain:   . Difficulty of Paying Living Expenses: Not on file  Food Insecurity:   . Worried About Charity fundraiser in the Last Year: Not on file  . Ran Out of Food in the Last Year: Not on file  Transportation Needs:   . Lack of Transportation (Medical): Not on file  . Lack of Transportation (Non-Medical): Not on file  Physical Activity:   . Days of Exercise per Week: Not on file  . Minutes of Exercise per Session: Not on file  Stress:   . Feeling of Stress : Not on file  Social Connections:   . Frequency of Communication with Friends and Family: Not on file  . Frequency of Social Gatherings with Friends and Family: Not on file  . Attends Religious Services: Not on file  . Active Member of Clubs or Organizations: Not  on file  . Attends Archivist Meetings: Not on file  . Marital Status: Not on file  Intimate Partner Violence:   . Fear of Current or  Ex-Partner: Not on file  . Emotionally Abused: Not on file  . Physically Abused: Not on file  . Sexually Abused: Not on file    Review of Systems  All other systems reviewed and are negative.   PHYSICAL EXAMINATION:    BP 118/78   Pulse 70   Temp (!) 97.2 F (36.2 C) (Temporal)   Ht 5\' 2"  (1.575 m)   Wt 175 lb (79.4 kg)   BMI 32.01 kg/m     General appearance: alert, cooperative and appears stated age  ASSESSMENT Stress.  Abnormal uterine bleeding.  Medication monitoring - Zoloft and Aygestin.   PLAN We reviewed benefits and side effects of Zoloft and Wellbutrin.  She will start Wellbutrin XL 150 mg daily.    She will overlap the Zoloft and Wellbutrin for 2 weeks and then stop Zoloft completely.  She will wean Aygestin down to 1.25 mg daily and then wants start Micronor again.  She may be ready to do this is April, 2021. FU in 6 weeks.    An After Visit Summary was printed and given to the patient.  _25_____ minutes face to face time of which over 50% was spent in counseling.

## 2019-03-22 NOTE — Patient Instructions (Signed)

## 2019-04-03 ENCOUNTER — Telehealth: Payer: Self-pay | Admitting: Obstetrics and Gynecology

## 2019-04-03 NOTE — Telephone Encounter (Signed)
Patient is asking to talk with Dr.Silva's nurse. She has a question and did not give any details.

## 2019-04-03 NOTE — Telephone Encounter (Signed)
Spoke to pt. Pt was here for OV on 03/22/2019. Pt wanting to now when to start taking Micronor? Pt has stopped taking Aygestin on 03/22/2019 and has not had a period. Pt wanting to know if should start POPs now or wait til having cycle? Pt states having no spotting, only light/mild cramping. Has used heating pad.   Routing to Dr Quincy Simmonds for recommendations on medications.

## 2019-04-04 NOTE — Telephone Encounter (Signed)
Spoke to pt. Pt given update from Dr Quincy Simmonds about UPT. Pt will plan to take UPT at home and then will call with results. Pt agreeable. Will take Micronor when menstruation starts.   Routing to Dr Quincy Simmonds for review.

## 2019-04-04 NOTE — Telephone Encounter (Signed)
She needs to wait for a menstrual cycle to start the Micronor.

## 2019-04-04 NOTE — Telephone Encounter (Signed)
Spoke to pt . Pt given recommendations per Dr Quincy Simmonds on when to start Micronor with cycle. Pt agreeable. Pt states still no bleeding for a cycle. Pt to wait another week and give call back to office when/if cycle starts. Pt agreeable.   Routing to Dr Quincy Simmonds for any additional recommendations.

## 2019-04-04 NOTE — Telephone Encounter (Signed)
I recommend a pregnancy test.   Have her wait for a menstruation to start the Micronor.

## 2019-04-11 ENCOUNTER — Other Ambulatory Visit: Payer: Self-pay

## 2019-04-11 ENCOUNTER — Ambulatory Visit (INDEPENDENT_AMBULATORY_CARE_PROVIDER_SITE_OTHER): Payer: BC Managed Care – PPO | Admitting: Obstetrics and Gynecology

## 2019-04-11 ENCOUNTER — Encounter: Payer: Self-pay | Admitting: Obstetrics and Gynecology

## 2019-04-11 VITALS — Temp 97.0°F | Ht 62.0 in | Wt 173.0 lb

## 2019-04-11 DIAGNOSIS — N926 Irregular menstruation, unspecified: Secondary | ICD-10-CM | POA: Diagnosis not present

## 2019-04-11 LAB — POCT URINE PREGNANCY: Preg Test, Ur: NEGATIVE

## 2019-04-11 MED ORDER — NORETHINDRONE 0.35 MG PO TABS: 1.0000 | ORAL_TABLET | Freq: Every day | ORAL | 0 refills | Status: DC

## 2019-04-11 NOTE — Progress Notes (Signed)
GYNECOLOGY  VISIT   HPI: 34 y.o.   Single  African American  female   [phone] with No LMP recorded.   here for missed menses.     Stopped Aygestin on 03-22-19 and no cycle. Home UPT negative. Patient has been having cramping like menstrual cramps, but no bleeding.  Patient has been treating with Aygestin for menometrorrhagia following removal of her Mirena IUD per request.  She has been weaning down on Aygestin and took 2.5 mg of Aygestin daily for a while before stopping on 03/22/19. Last menses was about 3 months ago.   Since stopping the Aygestin, she feels lighter.  Not feeling the major bloating.  Her mood is a little lower.  She is now taking Wellbutrin only.  Stopped Zoloft one week ago.  She has not noticed a change in her sleepiness, which she thought was caused by the Zoloft.   Gained about 25 - 30 pounds over the last 6 months.   Exercising 4 nights a week.  UPT: Neg  GYNECOLOGIC HISTORY: No LMP recorded. Contraception: None Menopausal hormone therapy:  n/a Last mammogram:  n/a Last pap smear:  12-21-18 Neg:Pos HR HPV,05-26-18 LSIL:Pos HR HPV,7/6/20high grade dysplasia, CIN II, with positive margins, ECC benign; 06/27/18 LSIL;06/19/18 Colpo showed high gradedysplasia         OB History    Gravida  3   Para  2   Term  2   Preterm  0   AB  1   Living  2     SAB  0   TAB  1   Ectopic  0   Multiple  0   Live Births  2              Patient Active Problem List   Diagnosis Date Noted  . Female bladder prolapse 08/04/2018  . Rectocele 08/04/2018  . GERD (gastroesophageal reflux disease) 03/09/2014  . Altered bowel habits 03/09/2014  . Bloating 03/09/2014  . Helicobacter pylori ab+ 03/09/2014  . 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 Menorrhagia 03/29/2013  . Encounter for counseling 03/29/2013  . Abdominal hernia 09/29/2011  . 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 Abdominal hernia   .  Abnormal Pap smear of cervix 2005   HPV +, 05-26-2018 LGSIL HPV HR+  . Anemia   . Anxiety   . Chlamydia 08/18/2015   repeat testing negative October 2017.  Marland Kitchen Condyloma acuminatum of vulva   . Depression    but doesn't require meds  . GERD (gastroesophageal reflux disease)    only takes OTC meds  . History of bronchitis 3+yrs ago  . History of migraine 09/20/11-last one  . HSV-1 (herpes simplex virus 1) infection   . HSV-2 (herpes simplex virus 2) infection   . Hyperlipidemia    postpartum 2+yrs ago  . Hypertension    postpartum 2+yrs ago  . Low vitamin D level 2018  . Migraine    with aura  . Sickle cell trait ([place])   . Sleeping difficulty   . Trichomonas infection 09/2016    Past Surgical History:  Procedure Laterality Date  . ABDOMINOPLASTY  09/29/2011   Procedure: ABDOMINOPLASTY;  Surgeon: Theodoro Kos, DO;  Location: [facility];  Service: Plastics;  Laterality: N/A;  ABDOMINOPLASTY FOR REPAIR OF RECTUS DIASTASIS  . EYE SURGERY  1993  . INTRAUTERINE DEVICE (IUD) INSERTION     10-31-13 mirena iud inserted  . LEEP  08/2018  CIN II - positive margins.   . OVARIAN CYST REMOVAL  2012  . VENTRAL HERNIA REPAIR  09/29/2011   Procedure: HERNIA REPAIR VENTRAL ADULT;  Surgeon: Joyice Faster. Cornett, MD;  Location: [facility];  Service: General;  Laterality: N/A;    Current Outpatient Medications  Medication Sig Dispense Refill  . acetaminophen (TYLENOL) 500 MG tablet Take 1,000 mg by mouth every 6 (six) hours as needed. For pain    . aspirin-acetaminophen-caffeine (EXCEDRIN MIGRAINE) 250-250-65 MG per tablet Take 1 tablet by mouth daily as needed. For migraines    . buPROPion (WELLBUTRIN XL) 150 MG 24 hr tablet Take 1 tablet (150 mg total) by mouth daily. 30 tablet 2  . fluticasone (FLONASE) 50 MCG/ACT nasal spray as needed.  0  . ibuprofen (ADVIL) 800 MG tablet Take 1 tablet (800 mg total) by mouth every 8 (eight) hours as needed. 30 tablet 0  . omeprazole (PRILOSEC) 40 MG capsule Take 1  capsule by mouth as needed.  0  . ondansetron (ZOFRAN-ODT) 4 MG disintegrating tablet Take 1 tablet by mouth as needed.  0  . oxybutynin (DITROPAN XL) 10 MG 24 hr tablet Take 1 tablet (10 mg total) by mouth at bedtime. (Patient not taking: Reported on 03/22/2019) 30 tablet 2  . sertraline (ZOLOFT) 50 MG tablet TK 1 T PO QD     No current facility-administered medications for this visit.     ALLERGIES: Other, Percocet [oxycodone-acetaminophen], and Valtrex [valacyclovir hcl]  Family History  Problem Relation Age of Onset  . Diabetes Mother   . Hypertension Mother   . Hypertension Father     Social History   Socioeconomic History  . Marital status: Single    Spouse name: Not on file  . Number of children: 2  . Years of education: Not on file  . Highest education level: Not on file  Occupational History  . Not on file  Tobacco Use  . Smoking status: Former Smoker    Types: Cigars    Quit date: 09/21/2006    Years since quitting: 12.5  . Smokeless tobacco: Never Used  Substance and Sexual Activity  . Alcohol use: Not Currently    Alcohol/week: 0.0 standard drinks    Comment: occas  . Drug use: No  . Sexual activity: Yes    Partners: Male    Birth control/protection: Pill    Comment: Micronor  Other Topics Concern  . Not on file  Social History Narrative  . Not on file   Social Determinants of Health   Financial Resource Strain:   . Difficulty of Paying Living Expenses: Not on file  Food Insecurity:   . Worried About Charity fundraiser in the Last Year: Not on file  . Ran Out of Food in the Last Year: Not on file  Transportation Needs:   . Lack of Transportation (Medical): Not on file  . Lack of Transportation (Non-Medical): Not on file  Physical Activity:   . Days of Exercise per Week: Not on file  . Minutes of Exercise per Session: Not on file  Stress:   . Feeling of Stress : Not on file  Social Connections:   . Frequency of Communication with Friends and  Family: Not on file  . Frequency of Social Gatherings with Friends and Family: Not on file  . Attends Religious Services: Not on file  . Active Member of Clubs or Organizations: Not on file  . Attends Archivist Meetings: Not on file  .  Marital Status: Not on file  Intimate Partner Violence:   . Fear of Current or Ex-Partner: Not on file  . Emotionally Abused: Not on file  . Physically Abused: Not on file  . Sexually Abused: Not on file    Review of Systems  All other systems reviewed and are negative.   PHYSICAL EXAMINATION:    Temp (!) 97 F (36.1 C) (Temporal)   Ht 5\' 2"  (1.575 m)   Wt 173 lb (78.5 kg)   BMI 31.64 kg/m     General appearance: alert, cooperative and appears stated age   Pelvic: External genitalia:  no lesions              Urethra:  normal appearing urethra with no masses, tenderness or lesions              Bartholins and Skenes: normal                 Vagina: normal appearing vagina with normal color and discharge, no lesions              Cervix: no lesions                Bimanual Exam:  Uterus:  normal size, contour, position, consistency, mobility, non-tender              Adnexa: no mass, fullness, tenderness          Chaperone was present for exam.  ASSESSMENT  Secondary amenorrhea. Absent menses.  Weight gain.   PLAN  We discussed her weight gain as a possible cause for her amenorrhea.  Quant hCG. Micronor 3 packs.  If her irregular menses return, will repeat EMB. FU annual exam in June.    An After Visit Summary was printed and given to the patient.  __20____ minutes face to face time of which over 50% was spent in counseling.

## 2019-04-12 LAB — BETA HCG QUANT (REF LAB): hCG Quant: <1 m[IU]/mL

## 2019-04-26 ENCOUNTER — Other Ambulatory Visit: Payer: Self-pay

## 2019-04-26 ENCOUNTER — Ambulatory Visit
Admission: EM | Admit: 2019-04-26 | Discharge: 2019-04-26 | Disposition: A | Discharge status: Home / Self Care | Payer: BC Managed Care – PPO | Attending: Internal Medicine | Admitting: Internal Medicine

## 2019-04-26 ENCOUNTER — Ambulatory Visit (INDEPENDENT_AMBULATORY_CARE_PROVIDER_SITE_OTHER): Payer: BC Managed Care – PPO

## 2019-04-26 DIAGNOSIS — Z20822 Contact with and (suspected) exposure to covid-19: Secondary | ICD-10-CM

## 2019-04-26 DIAGNOSIS — S99911A Unspecified injury of right ankle, initial encounter: Secondary | ICD-10-CM

## 2019-04-26 DIAGNOSIS — B9789 Other viral agents as the cause of diseases classified elsewhere: Secondary | ICD-10-CM

## 2019-04-26 DIAGNOSIS — M79671 Pain in right foot: Secondary | ICD-10-CM | POA: Diagnosis not present

## 2019-04-26 DIAGNOSIS — M25571 Pain in right ankle and joints of right foot: Secondary | ICD-10-CM

## 2019-04-26 DIAGNOSIS — W19XXXA Unspecified fall, initial encounter: Secondary | ICD-10-CM | POA: Diagnosis not present

## 2019-04-26 DIAGNOSIS — J019 Acute sinusitis, unspecified: Secondary | ICD-10-CM

## 2019-04-26 MED ORDER — AMOXICILLIN-POT CLAVULANATE 875-125 MG PO TABS: 1.0000 | ORAL_TABLET | Freq: Two times a day (BID) | ORAL | 0 refills | Status: DC

## 2019-04-26 MED ORDER — IBUPROFEN 800 MG PO TABS: 800.0000 mg | ORAL_TABLET | Freq: Three times a day (TID) | ORAL | 0 refills | Status: DC

## 2019-04-26 MED ORDER — FLUTICASONE PROPIONATE 50 MCG/ACT NA SUSP: 1.0000 | Freq: Every day | NASAL | 0 refills | Status: DC

## 2019-04-26 NOTE — Discharge Instructions (Addendum)
COVID swab pending, monitor my chart for results Begin Augmentin twice daily for the next week Flonase nasal spray 1 to 2 spray in each nostril daily  X-rays without fracture Use anti-inflammatories for pain/swelling. You may take up to 800 mg Ibuprofen every 8 hours with food. You may supplement Ibuprofen with Tylenol [phone] mg every 8 hours.  Ice and elevate Weight-bear as tolerated Follow-up with sports medicine if pain not improving in approximately 1 to 2 weeks or not regaining full range of motion of ankle/foot

## 2019-04-26 NOTE — ED Triage Notes (Signed)
Pt presents to [facility] for assessment of right ankle pain after she states all week she has been having dizziness with sinus congestion and she got so dizzy fell all the way down.  States the ankle did not start to hurt until 2-3 hours later.  Denies head injury.

## 2019-04-26 NOTE — ED Notes (Signed)
Patient able to ambulate independently with crutches 

## 2019-04-27 LAB — NOVEL CORONAVIRUS, NAA: SARS-CoV-2, NAA: NOT DETECTED

## 2019-04-27 NOTE — ED Provider Notes (Signed)
[facility]    CSN: [id number] Arrival date & time: 04/26/19  1652      History   Chief Complaint Chief Complaint  Patient presents with  . Ankle Pain    HPI Kai Kennon is a 34 y.o. female history of GERD, hypertension, presenting today for evaluation of right ankle injury and sinus congestion.  Patient notes that for approximately 1 week she has been dealing with sinus congestion and pressure.  This has been causing her to feel dizzy with occasional lightheadedness/spinning sensation.  This all started after traveling.  Denies any fevers chills or body aches.  Has been using over-the-counter medicine with minimal relief.  Also reports right ear pressure.  Earlier today the dizziness was so bad this caused her to fall and she inverted her right ankle/foot and fell to the ground.  She did not have immediate pain but as the day went on her pain progressively worsened and is causing significant limping and pain with weightbearing.  Denies prior injury to this ankle.  Denies hitting head or loss of consciousness.  HPI  Past Medical History:  Diagnosis Date  . Abdominal hernia   . Abnormal Pap smear of cervix 2005   HPV +, 05-26-2018 LGSIL HPV HR+  . Anemia   . Anxiety   . Chlamydia 08/18/2015   repeat testing negative October 2017.  Marland Kitchen Condyloma acuminatum of vulva   . Depression    but doesn't require meds  . GERD (gastroesophageal reflux disease)    only takes OTC meds  . History of bronchitis 3+yrs ago  . History of migraine 09/20/11-last one  . HSV-1 (herpes simplex virus 1) infection   . HSV-2 (herpes simplex virus 2) infection   . Hyperlipidemia    postpartum 2+yrs ago  . Hypertension    postpartum 2+yrs ago  . Low vitamin D level 2018  . Migraine    with aura  . Sickle cell trait ([place])   . Sleeping difficulty   . Trichomonas infection 09/2016    Patient Active Problem List   Diagnosis Date Noted  . Female bladder prolapse 08/04/2018  . Rectocele  08/04/2018  . GERD (gastroesophageal reflux disease) 03/09/2014  . Altered bowel habits 03/09/2014  . Bloating 03/09/2014  . Helicobacter pylori ab+ 03/09/2014  . 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 Menorrhagia 03/29/2013  . Encounter for counseling 03/29/2013  . Abdominal hernia 09/29/2011  . 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 ABDOMINOPLASTY  09/29/2011   Procedure: ABDOMINOPLASTY;  Surgeon: Theodoro Kos, DO;  Location: [facility];  Service: Plastics;  Laterality: N/A;  ABDOMINOPLASTY FOR REPAIR OF RECTUS DIASTASIS  . EYE SURGERY  1993  . INTRAUTERINE DEVICE (IUD) INSERTION     10-31-13 mirena iud inserted  . 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   . OVARIAN CYST REMOVAL  2012  . VENTRAL HERNIA REPAIR  09/29/2011   Procedure: HERNIA REPAIR VENTRAL ADULT;  Surgeon: Joyice Faster. Cornett, MD;  [facility];  Service: General;  Laterality: N/A;    OB History    Gravida  3   Para  2   Term  2   Preterm  0   AB  1   Living  2     SAB  0   TAB  1   Ectopic  0   Multiple  0   Live Births  2  Home Medications    Prior to Admission medications   Medication Sig Start Date End Date Taking? Authorizing Provider  acetaminophen (TYLENOL) 500 MG tablet Take 1,000 mg by mouth every 6 (six) hours as needed. For pain    [provider]  amoxicillin-clavulanate (AUGMENTIN) 875-125 MG tablet Take 1 tablet by mouth every 12 (twelve) hours for 7 days. 04/26/19 05/03/19  Constant Mandeville C, PA-C  aspirin-acetaminophen-caffeine (EXCEDRIN MIGRAINE) [phone] MG per tablet Take 1 tablet by mouth daily as needed. For migraines    [provider]  buPROPion (WELLBUTRIN XL) 150 MG 24 hr tablet Take 1 tablet (150 mg total) by mouth daily. 03/22/19   Nunzio Cobbs, MD  fluticasone (FLONASE) 50 MCG/ACT nasal spray Place 1-2 sprays into both nostrils daily. 04/26/19   Kitana Gage C, PA-C    ibuprofen (ADVIL) 800 MG tablet Take 1 tablet (800 mg total) by mouth 3 (three) times daily. 04/26/19   Dalylah Ramey C, PA-C  omeprazole (PRILOSEC) 40 MG capsule Take 1 capsule by mouth as needed. 03/25/16   [provider]  ondansetron (ZOFRAN-ODT) 4 MG disintegrating tablet Take 1 tablet by mouth as needed. 10/17/16   [provider]  sertraline (ZOLOFT) 50 MG tablet TK 1 T PO QD 09/12/18   [provider]  norethindrone (MICRONOR) 0.35 MG tablet Take 1 tablet (0.35 mg total) by mouth daily. 04/11/19 04/26/19  Nunzio Cobbs, MD    Family History Family History  Problem Relation Age of Onset  . Diabetes Mother   . Hypertension Mother   . Hypertension Father     Social History Social History   Tobacco Use  . Smoking status: Former Smoker    Types: Cigars    Quit date: 09/21/2006    Years since quitting: 12.6  . Smokeless tobacco: Never Used  Substance Use Topics  . Alcohol use: Not Currently    Alcohol/week: 0.0 standard drinks    Comment: occas  . Drug use: No     Allergies   Other, Percocet [oxycodone-acetaminophen], and Valtrex [valacyclovir hcl]   Review of Systems Review of Systems  Constitutional: Negative for activity change, appetite change, chills, fatigue and fever.  HENT: Positive for congestion, ear pain, rhinorrhea and sinus pressure. Negative for sore throat and trouble swallowing.   Eyes: Negative for discharge and redness.  Respiratory: Negative for cough, chest tightness and shortness of breath.   Cardiovascular: Negative for chest pain.  Gastrointestinal: Negative for abdominal pain, diarrhea, nausea and vomiting.  Musculoskeletal: Positive for arthralgias, gait problem and joint swelling. Negative for myalgias.  Skin: Negative for rash.  Neurological: Negative for dizziness, light-headedness and headaches.     Physical Exam Triage Vital Signs ED Triage Vitals  Enc Vitals Group     BP 04/26/19 1700 128/83      Pulse Rate 04/26/19 1700 87     Resp 04/26/19 1700 18     Temp 04/26/19 1700 97.8 F (36.6 C)     Temp Source 04/26/19 1700 Temporal     SpO2 04/26/19 1700 94 %     Weight --      Height --      Head Circumference --      Peak Flow --      Pain Score 04/26/19 1659 8     Pain Loc --      Pain Edu? --      Excl. in [place]? --    No data found.  Updated Vital Signs BP 128/83 (BP Location: Left Arm)   Pulse 87   Temp 97.8 F (36.6 C) (Temporal)   Resp 18   LMP 04/16/2019   SpO2 94%   Visual Acuity Right Eye Distance:   Left Eye Distance:   Bilateral Distance:    Right Eye Near:   Left Eye Near:    Bilateral Near:     Physical Exam Vitals and nursing note reviewed.  Constitutional:      General: She is not in acute distress.    Appearance: She is well-developed.  HENT:     Head: Normocephalic and atraumatic.     Ears:     Comments: Clear effusion present on right TM    Nose:     Comments: Nasal mucosa erythematous, mildly swollen turbinates    Mouth/Throat:     Comments: Oral mucosa pink and moist, no tonsillar enlargement or exudate. Posterior pharynx patent and nonerythematous, no uvula deviation or swelling. Normal phonation.  Eyes:     Conjunctiva/sclera: Conjunctivae normal.  Cardiovascular:     Rate and Rhythm: Normal rate and regular rhythm.     Heart sounds: No murmur.  Pulmonary:     Effort: Pulmonary effort is normal. No respiratory distress.     Breath sounds: Normal breath sounds.     Comments: Breathing comfortably at rest, CTABL, no wheezing, rales or other adventitious sounds auscultated  Abdominal:     Palpations: Abdomen is soft.     Tenderness: There is no abdominal tenderness.  Musculoskeletal:     Cervical back: Neck supple.  Skin:    General: Skin is warm and dry.     Comments: Right ankle/foot: Mild swelling noted to lateral malleolus extending into anterior ankle and proximal dorsum of foot, no obvious discoloration, tender to palpation  to lateral malleolus extending anteriorly into dorsum of foot, also with tenderness along distal first metatarsal Dorsalis pedis 2+, cap refill brisk Limited dorsiflexion Ambulating with antalgia  Neurological:     General: No focal deficit present.     Mental Status: She is alert and oriented to person, place, and time. Mental status is at baseline.     Comments: Face symmetric, speech clear, moving all extremities appropriately      UC Treatments / Results  Labs (all labs ordered are listed, but only abnormal results are displayed) Labs Reviewed  NOVEL CORONAVIRUS, NAA    EKG   Radiology DG Ankle Complete Right  Result Date: 04/26/2019 CLINICAL DATA:  Recent fall with inversion injury, initial encounter EXAM: RIGHT ANKLE - COMPLETE 3+ VIEW COMPARISON:  None. FINDINGS: No acute fracture or dislocation is noted. Mild soft tissue swelling is noted posteriorly. Small calcaneal spur is noted. IMPRESSION: Soft tissue injury without acute bony abnormality. Electronically Signed   By: Inez Catalina M.D.   On: 04/26/2019 17:45   DG Foot Complete Right  Result Date: 04/26/2019 CLINICAL DATA:  Recent inversion injury with foot pain, initial encounter EXAM: RIGHT FOOT COMPLETE - 3+ VIEW COMPARISON:  None. FINDINGS: Small calcaneal spur is noted. No acute fracture or dislocation is noted. No gross soft tissue abnormality is noted. IMPRESSION: No acute abnormality noted. Electronically Signed   By: Inez Catalina M.D.   On: 04/26/2019 17:46    Procedures Procedures (including critical care time)  Medications Ordered in UC Medications - No data to display  Initial Impression / Assessment and Plan / UC Course  I have reviewed the triage vital signs and the nursing notes.  Pertinent labs & imaging results that were available during my care of the patient were reviewed by me and considered in my medical decision making (see chart for details).     1.  Ankle sprain X-rays negative for acute  bony abnormality, will treat as sprain, Ace wrap to ankle/foot, weight-bear as tolerated, crutches provided for comfort.  Tylenol and ibuprofen for pain and swelling.  If not regaining full range of motion and pain gradually improving over the next 1 to 2 weeks recommending follow-up with sports medicine.  2.  Sinusitis Symptoms greater than 1 week, will treat for sinusitis with Augmentin as well as initiating on Flonase to help with underlying eustachian tube dysfunction that may be contributing to dizziness.  Has not had Covid testing, Covid PCR pending.  Post fluids, rest.  Continue to monitor symptoms,Discussed strict return precautions. Patient verbalized understanding and is agreeable with plan.  Final Clinical Impressions(s) / UC Diagnoses   Final diagnoses:  Injury of right ankle, initial encounter  Acute sinusitis with symptoms > 10 days     Discharge Instructions     COVID swab pending, monitor my chart for results Begin Augmentin twice daily for the next week Flonase nasal spray 1 to 2 spray in each nostril daily  X-rays without fracture Use anti-inflammatories for pain/swelling. You may take up to 800 mg Ibuprofen every 8 hours with food. You may supplement Ibuprofen with Tylenol [phone] mg every 8 hours.  Ice and elevate Weight-bear as tolerated Follow-up with sports medicine if pain not improving in approximately 1 to 2 weeks or not regaining full range of motion of ankle/foot    ED Prescriptions    Medication Sig Dispense Auth. Provider   amoxicillin-clavulanate (AUGMENTIN) 875-125 MG tablet Take 1 tablet by mouth every 12 (twelve) hours for 7 days. 14 tablet Josephina Melcher C, PA-C   fluticasone (FLONASE) 50 MCG/ACT nasal spray Place 1-2 sprays into both nostrils daily. 16 g Cannon Quinton C, PA-C   ibuprofen (ADVIL) 800 MG tablet Take 1 tablet (800 mg total) by mouth 3 (three) times daily. 21 tablet Altin Sease, [place] C, PA-C     PDMP not reviewed this encounter.    Knox Holdman, [place] C, PA-C 04/27/19 0830

## 2019-04-29 ENCOUNTER — Telehealth: Payer: Self-pay | Admitting: Emergency Medicine

## 2019-04-29 MED ORDER — DOXYCYCLINE HYCLATE 100 MG PO CAPS: 100.0000 mg | ORAL_CAPSULE | Freq: Two times a day (BID) | ORAL | 0 refills | Status: DC

## 2019-04-29 NOTE — Telephone Encounter (Signed)
Patient called stating she is having vomiting with doses of Augmentin.  States she has taken it in the past and had upset stomach with it and states she would have asked the provider for something different if she had realized at her appointment.  Spoke to APP on site who reviewed chart and will send in doxycycline for patient.  Reviewed normal side effects of antibiotics, and when to be concerned.  Patient verbalized understanding.

## 2019-05-04 ENCOUNTER — Encounter: Payer: Self-pay | Admitting: Certified Nurse Midwife

## 2019-05-08 ENCOUNTER — Ambulatory Visit (INDEPENDENT_AMBULATORY_CARE_PROVIDER_SITE_OTHER): Payer: BC Managed Care – PPO | Admitting: Obstetrics and Gynecology

## 2019-05-08 ENCOUNTER — Encounter: Payer: Self-pay | Admitting: Obstetrics and Gynecology

## 2019-05-08 ENCOUNTER — Other Ambulatory Visit: Payer: Self-pay

## 2019-05-08 VITALS — BP 100/70 | HR 80 | Temp 98.1°F | Ht 62.0 in | Wt 177.2 lb

## 2019-05-08 DIAGNOSIS — F329 Major depressive disorder, single episode, unspecified: Secondary | ICD-10-CM

## 2019-05-08 DIAGNOSIS — Z5181 Encounter for therapeutic drug level monitoring: Secondary | ICD-10-CM | POA: Diagnosis not present

## 2019-05-08 DIAGNOSIS — R52 Pain, unspecified: Secondary | ICD-10-CM

## 2019-05-08 DIAGNOSIS — F32A Depression, unspecified: Secondary | ICD-10-CM

## 2019-05-08 DIAGNOSIS — F419 Anxiety disorder, unspecified: Secondary | ICD-10-CM

## 2019-05-08 DIAGNOSIS — B354 Tinea corporis: Secondary | ICD-10-CM

## 2019-05-08 MED ORDER — CLOTRIMAZOLE 1 % EX CREA: 1.0000 "application " | TOPICAL_CREAM | Freq: Two times a day (BID) | CUTANEOUS | 0 refills | Status: DC

## 2019-05-08 NOTE — Progress Notes (Signed)
GYNECOLOGY  VISIT   HPI: 34 y.o.   Single  African American  female   [phone] with Patient's last menstrual period was 04/16/2019 (exact date).   here for medication follow up   She states her Wellbutrin is not helping her after taking it for 6 weeks.  She feels jittery, and it is noticeable.  She is taking it at night. She does have headaches when she takes it in the am.  It has helped her sex drive.  She feels down and anxious.  She misses the Zoloft.   She stopped her Zoloft due to fatigue.   Mood wise, it helped her and her PMS symptoms.   She wants to return to Zoloft 25 mg and take her Wellbutrin 150 mg XL together.  She has one month of the Wellbutrin.  She has one month of Zoloft also.   She wants to check her vit D level.  Hx low vit D.  Has body aches.  She did not start the Micronor. She is worried about mood changes.   She also has an itchy chest rash.  She wants treatment.   GYNECOLOGIC HISTORY: Patient's last menstrual period was 04/16/2019 (exact date). Contraception:  none Menopausal hormone therapy:  n/a Last mammogram:  n/a Last pap smear: 12-21-18 Neg:Pos HR HPV,05-26-18 LSIL:Pos HR HPV,7/6/20high grade dysplasia, CIN II, with positive margins, ECC benign; 06/27/18 LSIL;06/19/18 Colpo showed high gradedysplasia        OB History    Gravida  3   Para  2   Term  2   Preterm  0   AB  1   Living  2     SAB  0   TAB  1   Ectopic  0   Multiple  0   Live Births  2              Patient Active Problem List   Diagnosis Date Noted  . Female bladder prolapse 08/04/2018  . Rectocele 08/04/2018  . GERD (gastroesophageal reflux disease) 03/09/2014  . Altered bowel habits 03/09/2014  . Bloating 03/09/2014  . Helicobacter pylori ab+ 03/09/2014  . 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 Menorrhagia 03/29/2013  . Encounter for counseling 03/29/2013  . Abdominal hernia 09/29/2011  . Rectus diastasis 06/22/2011    Past Medical  History:  Diagnosis Date  . Abdominal hernia   . Abnormal Pap smear of cervix 2005   HPV +, 05-26-2018 LGSIL HPV HR+  . Anemia   . Anxiety   . Chlamydia 08/18/2015   repeat testing negative October 2017.  Marland Kitchen Condyloma acuminatum of vulva   . Depression    but doesn't require meds  . GERD (gastroesophageal reflux disease)    only takes OTC meds  . History of bronchitis 3+yrs ago  . History of migraine 09/20/11-last one  . HSV-1 (herpes simplex virus 1) infection   . HSV-2 (herpes simplex virus 2) infection   . Hyperlipidemia    postpartum 2+yrs ago  . Hypertension    postpartum 2+yrs ago  . Low vitamin D level 2018  . Migraine    with aura  . Sickle cell trait ([place])   . Sleeping difficulty   . Trichomonas infection 09/2016    Past Surgical History:  Procedure Laterality Date  . ABDOMINOPLASTY  09/29/2011   Procedure: ABDOMINOPLASTY;  Surgeon: Theodoro Kos, DO;  Location: [facility];  Service: Plastics;  Laterality: N/A;  ABDOMINOPLASTY FOR REPAIR OF RECTUS DIASTASIS  . EYE SURGERY  [place] (IUD) INSERTION     10-31-13 mirena iud inserted  . 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   . OVARIAN CYST REMOVAL  2012  . VENTRAL HERNIA REPAIR  09/29/2011   Procedure: HERNIA REPAIR VENTRAL ADULT;  Surgeon: Joyice Faster. Cornett, MD;  Location: [facility];  Service: General;  Laterality: N/A;    Current Outpatient Medications  Medication Sig Dispense Refill  . acetaminophen (TYLENOL) 500 MG tablet Take 1,000 mg by mouth every 6 (six) hours as needed. For pain    . aspirin-acetaminophen-caffeine (EXCEDRIN MIGRAINE) 250-250-65 MG per tablet Take 1 tablet by mouth daily as needed. For migraines    . buPROPion (WELLBUTRIN XL) 150 MG 24 hr tablet Take 1 tablet (150 mg total) by mouth daily. 30 tablet 2  . fluticasone (FLONASE) 50 MCG/ACT nasal spray Place 1-2 sprays into both nostrils daily. 16 g 0  . ibuprofen (ADVIL) 800 MG tablet Take 1 tablet (800 mg total) by mouth 3 (three)  times daily. 21 tablet 0  . omeprazole (PRILOSEC) 40 MG capsule Take 1 capsule by mouth as needed.  0  . sertraline (ZOLOFT) 50 MG tablet TK 1 T PO QD    . doxycycline (VIBRAMYCIN) 100 MG capsule Take 1 capsule (100 mg total) by mouth 2 (two) times daily. (Patient not taking: Reported on 05/08/2019) 10 capsule 0  . ondansetron (ZOFRAN-ODT) 4 MG disintegrating tablet Take 1 tablet by mouth as needed.  0   No current facility-administered medications for this visit.     ALLERGIES: Other, Percocet [oxycodone-acetaminophen], and Valtrex [valacyclovir hcl]  Family History  Problem Relation Age of Onset  . Diabetes Mother   . Hypertension Mother   . Hypertension Father     Social History   Socioeconomic History  . Marital status: Single    Spouse name: Not on file  . Number of children: 2  . Years of education: Not on file  . Highest education level: Not on file  Occupational History  . Not on file  Tobacco Use  . Smoking status: Former Smoker    Types: Cigars    Quit date: 09/21/2006    Years since quitting: 12.6  . Smokeless tobacco: Never Used  Substance and Sexual Activity  . Alcohol use: Not Currently    Alcohol/week: 0.0 standard drinks    Comment: occas  . Drug use: No  . Sexual activity: Yes    Partners: Male    Birth control/protection: Pill    Comment: Micronor  Other Topics Concern  . Not on file  Social History Narrative  . Not on file   Social Determinants of Health   Financial Resource Strain:   . Difficulty of Paying Living Expenses:   Food Insecurity:   . Worried About Charity fundraiser in the Last Year:   . Arboriculturist in the Last Year:   Transportation Needs:   . Film/video editor (Medical):   Marland Kitchen Lack of Transportation (Non-Medical):   Physical Activity:   . Days of Exercise per Week:   . Minutes of Exercise per Session:   Stress:   . Feeling of Stress :   Social Connections:   . Frequency of Communication with Friends and Family:    . Frequency of Social Gatherings with Friends and Family:   . Attends Religious Services:   . Active Member of Clubs or Organizations:   . Attends Archivist Meetings:   .  Marital Status:   Intimate Partner Violence:   . Fear of Current or Ex-Partner:   . Emotionally Abused:   Marland Kitchen Physically Abused:   . Sexually Abused:     Review of Systems  All other systems reviewed and are negative.   PHYSICAL EXAMINATION:    Temp 98.1 F (36.7 C) (Temporal)   Ht 5\' 2"  (1.575 m)   Wt 177 lb 3.2 oz (80.4 kg)   LMP 04/16/2019 (Exact Date)   BMI 32.41 kg/m     General appearance: alert, cooperative and appears stated age   Skin: midline chest with rash - circular coalescing hyperpigmented areas.   ASSESSMENT  Body aches.  Depression. Rash.  Tinea versicolor versus corporis.   PLAN  She will take Wellbutrin XL 150 mg daily and Zoloft 25 mg daily.  She will call back in one month to report if she wants to continue both Zoloft and Wellbutrin XL Vit D check.  Clotrimazole 1% cream, apply bid x 1 month.  Annual in 6 months.    An After Visit Summary was printed and given to the patient.  __25____ minutes face to face time of which over 50% was spent in counseling.

## 2019-05-09 LAB — VITAMIN D 25 HYDROXY (VIT D DEFICIENCY, FRACTURES): Vit D, 25-Hydroxy: 40.7 ng/mL (ref 30.0–100.0)

## 2019-05-17 ENCOUNTER — Telehealth: Payer: Self-pay

## 2019-05-17 NOTE — Telephone Encounter (Signed)
Spoke with patient, advised per Dr. Quincy Simmonds. Patient states she has seen a psychiatrist in the past, has been years ago. Reviewed options for providers, if needed. Advised patient should not need a referral, she can call to schedule. Advised patient to return call to office if referral required or any additional assistance is needed. Patient verbalizes understanding and is agreeable.   Routing to provider for final review. Patient is agreeable to disposition. Will close encounter.

## 2019-05-17 NOTE — Telephone Encounter (Signed)
Spoke with patient. Patient is currently on bupropion 150 mg 24 hr tab PO daily. Patient was seen in office on 05/08/19. Reports medication is working, but is concerned about side effects.Reports headache, lightheadedness and dizziness. Patient is requesting to switch to Wellbutrin SR PO daily to see if this reduces side effects. Patient states she knows this medication is typically taken twice a day, asking if possible to try once a day?   Advised I will review with Dr. Quincy Simmonds and f/u with recommendations. Patient agreeable.   Routing to Dr. Quincy Simmonds to advise.

## 2019-05-17 NOTE — Telephone Encounter (Signed)
Patient called for medication questions regarding medication Bupropion.

## 2019-05-17 NOTE — Telephone Encounter (Signed)
I recommend that she not take the Wellbutrin XL or the Wellbutrin SR.  They both can cause tremor, agitation, headache and dizziness, whether they are taken once or twice a day.  I recommend we have her see a psychiatrist, as this type of physician will be able to find just the right option for her that limits her side effects.

## 2019-05-28 ENCOUNTER — Other Ambulatory Visit: Payer: Self-pay

## 2019-05-28 ENCOUNTER — Other Ambulatory Visit: Payer: Self-pay | Admitting: Obstetrics and Gynecology

## 2019-05-28 NOTE — Telephone Encounter (Signed)
Call to patient. Patient states she just came off her period and started to notice a sticky discharge and that she thought her pH had changed. RN advised OV for further evaluation. Patient requesting to schedule tomorrow. Patient scheduled for 05-29-19 at 1630 with Dr. Talbert Nan. Patient agreeable to date and time of appointment. Will update pharmacy.  Routing to covering provider and will close encounter.

## 2019-05-29 ENCOUNTER — Ambulatory Visit (INDEPENDENT_AMBULATORY_CARE_PROVIDER_SITE_OTHER): Payer: BC Managed Care – PPO | Admitting: Obstetrics and Gynecology

## 2019-05-29 ENCOUNTER — Encounter: Payer: Self-pay | Admitting: Obstetrics and Gynecology

## 2019-05-29 VITALS — BP 112/64 | HR 86 | Temp 98.9°F | Ht 62.0 in | Wt 177.6 lb

## 2019-05-29 DIAGNOSIS — B9689 Other specified bacterial agents as the cause of diseases classified elsewhere: Secondary | ICD-10-CM | POA: Diagnosis not present

## 2019-05-29 DIAGNOSIS — B372 Candidiasis of skin and nail: Secondary | ICD-10-CM | POA: Diagnosis not present

## 2019-05-29 DIAGNOSIS — N760 Acute vaginitis: Secondary | ICD-10-CM

## 2019-05-29 MED ORDER — NYSTATIN 100000 UNIT/GM EX CREA: 1.0000 "application " | TOPICAL_CREAM | Freq: Two times a day (BID) | CUTANEOUS | 0 refills | Status: DC

## 2019-05-29 MED ORDER — FLUCONAZOLE 150 MG PO TABS: 150.0000 mg | ORAL_TABLET | Freq: Once | ORAL | 0 refills | Status: AC

## 2019-05-29 MED ORDER — METRONIDAZOLE 500 MG PO TABS: 500.0000 mg | ORAL_TABLET | Freq: Two times a day (BID) | ORAL | 0 refills | Status: DC

## 2019-05-29 NOTE — Progress Notes (Signed)
GYNECOLOGY  VISIT   HPI: 34 y.o.   Single Black or African American Not Hispanic or Latino  female   [phone] with Patient's last menstrual period was 05/21/2019.   here for vaginal discharge and odor.  Her symptoms started a few weeks ago. She noticed a mild odor, increased after her cycle ended a few days ago. She notices an increase in sticky white vaginal d/c. Slight itching, mild.   She c/o an itchy rash in between her breasts.   Sexually active, not using protection, okay if she gets pregnant.   Dr Quincy Simmonds treated the patient with aygestin for secondary amenorrhea earlier this year. She was told to take the aygestin if her cycle is too heavy.  In 2/21 she was given micronor, not taking it.  She had a normal cycle in March and April on her own.    GYNECOLOGIC HISTORY: Patient's last menstrual period was 05/21/2019. Contraception:none  Menopausal hormone therapy: none         OB History    Gravida  3   Para  2   Term  2   Preterm  0   AB  1   Living  2     SAB  0   TAB  1   Ectopic  0   Multiple  0   Live Births  2              Patient Active Problem List   Diagnosis Date Noted  . Female bladder prolapse 08/04/2018  . Rectocele 08/04/2018  . GERD (gastroesophageal reflux disease) 03/09/2014  . Altered bowel habits 03/09/2014  . Bloating 03/09/2014  . Helicobacter pylori ab+ 03/09/2014  . 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 Menorrhagia 03/29/2013  . Encounter for counseling 03/29/2013  . Abdominal hernia 09/29/2011  . 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 Abdominal hernia   . Abnormal Pap smear of cervix 2005   HPV +, 05-26-2018 LGSIL HPV HR+  . Anemia   . Anxiety   . Chlamydia 08/18/2015   repeat testing negative October 2017.  Marland Kitchen Condyloma acuminatum of vulva   . Depression    but doesn't require meds  . GERD (gastroesophageal reflux disease)    only takes OTC meds  . History of bronchitis 3+yrs  ago  . History of migraine 09/20/11-last one  . HSV-1 (herpes simplex virus 1) infection   . HSV-2 (herpes simplex virus 2) infection   . Hyperlipidemia    postpartum 2+yrs ago  . Hypertension    postpartum 2+yrs ago  . Low vitamin D level 2018  . Migraine    with aura  . Sickle cell trait ([place])   . Sleeping difficulty   . Trichomonas infection 09/2016    Past Surgical History:  Procedure Laterality Date  . ABDOMINOPLASTY  09/29/2011   Procedure: ABDOMINOPLASTY;  Surgeon: Theodoro Kos, DO;  Location: [facility];  Service: Plastics;  Laterality: N/A;  ABDOMINOPLASTY FOR REPAIR OF RECTUS DIASTASIS  . EYE SURGERY  1993  . INTRAUTERINE DEVICE (IUD) INSERTION     10-31-13 mirena iud inserted  . 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   . OVARIAN CYST REMOVAL  2012  . VENTRAL HERNIA REPAIR  09/29/2011   Procedure: HERNIA REPAIR VENTRAL ADULT;  Surgeon: Joyice Faster. Cornett, MD;  Location: [facility];  Service: General;  Laterality: N/A;    Current Outpatient Medications  Medication Sig Dispense Refill  .  acetaminophen (TYLENOL) 500 MG tablet Take 1,000 mg by mouth every 6 (six) hours as needed. For pain    . aspirin-acetaminophen-caffeine (EXCEDRIN MIGRAINE) 250-250-65 MG per tablet Take 1 tablet by mouth daily as needed. For migraines    . buPROPion (WELLBUTRIN SR) 100 MG 12 hr tablet Take 100 mg by mouth every morning.    . clotrimazole (LOTRIMIN) 1 % cream Apply 1 application topically 2 (two) times daily. 30 g 0  . fluticasone (FLONASE) 50 MCG/ACT nasal spray Place 1-2 sprays into both nostrils daily. 16 g 0  . ibuprofen (ADVIL) 800 MG tablet Take 1 tablet (800 mg total) by mouth 3 (three) times daily. 21 tablet 0  . omeprazole (PRILOSEC) 40 MG capsule Take 1 capsule by mouth as needed.  0  . sertraline (ZOLOFT) 50 MG tablet TK 1 T PO QD    . norethindrone (AYGESTIN) 5 MG tablet Take 5 mg by mouth daily.    . propranolol ER (INDERAL LA) 60 MG 24 hr capsule Take 60 mg by mouth daily.     No  current facility-administered medications for this visit.     ALLERGIES: Other, Percocet [oxycodone-acetaminophen], and Valtrex [valacyclovir hcl]  Family History  Problem Relation Age of Onset  . Diabetes Mother   . Hypertension Mother   . Hypertension Father     Social History   Socioeconomic History  . Marital status: Single    Spouse name: Not on file  . Number of children: 2  . Years of education: Not on file  . Highest education level: Not on file  Occupational History  . Not on file  Tobacco Use  . Smoking status: Former Smoker    Types: Cigars    Quit date: 09/21/2006    Years since quitting: 12.6  . Smokeless tobacco: Never Used  Substance and Sexual Activity  . Alcohol use: Not Currently    Alcohol/week: 0.0 standard drinks    Comment: occas  . Drug use: No  . Sexual activity: Yes    Partners: Male    Birth control/protection: Pill    Comment: Micronor  Other Topics Concern  . Not on file  Social History Narrative  . Not on file   Social Determinants of Health   Financial Resource Strain:   . Difficulty of Paying Living Expenses:   Food Insecurity:   . Worried About Charity fundraiser in the Last Year:   . Arboriculturist in the Last Year:   Transportation Needs:   . Film/video editor (Medical):   Marland Kitchen Lack of Transportation (Non-Medical):   Physical Activity:   . Days of Exercise per Week:   . Minutes of Exercise per Session:   Stress:   . Feeling of Stress :   Social Connections:   . Frequency of Communication with Friends and Family:   . Frequency of Social Gatherings with Friends and Family:   . Attends Religious Services:   . Active Member of Clubs or Organizations:   . Attends Archivist Meetings:   Marland Kitchen Marital Status:   Intimate Partner Violence:   . Fear of Current or Ex-Partner:   . Emotionally Abused:   Marland Kitchen Physically Abused:   . Sexually Abused:     Review of Systems  All other systems reviewed and are  negative.   PHYSICAL EXAMINATION:    BP 112/64   Pulse 86   Temp 98.9 F (37.2 C)   Ht 5\' 2"  (1.575 m)  Wt 177 lb 9.6 oz (80.6 kg)   LMP 05/21/2019   SpO2 98%   BMI 32.48 kg/m     General appearance: alert, cooperative and appears stated age Skin: thick, dark rash between her breasts  Pelvic: External genitalia:  no lesions              Urethra:  normal appearing urethra with no masses, tenderness or lesions              Bartholins and Skenes: normal                 Vagina: normal appearing vagina with normal color and discharge, no lesions              Cervix: no lesions               Chaperone was present for exam.  Wet prep: + clue, no trich, rare wbc KOH: no yeast PH: 5   ASSESSMENT Bacterial vaginitis Candida intertrigo Unprotected intercourse, she is on a multivitamin with folic acid. Advised not to take aygestin if any chance of pregnancy (not currently taking it)    PLAN Treat BV with flagyl She requests diflucan for possible yeast (typically occurs for her with flagyl) Nystatin for candida

## 2019-05-29 NOTE — Patient Instructions (Signed)
Skin Yeast Infection  A skin yeast infection is a condition in which there is an overgrowth of yeast (candida) that normally lives on the skin. This condition usually occurs in areas of the skin that are constantly warm and moist, such as the armpits or the groin. What are the causes? This condition is caused by a change in the normal balance of the yeast and bacteria that live on the skin. What increases the risk? You are more likely to develop this condition if you:  Are obese.  Are pregnant.  Take birth control pills.  Have diabetes.  Take antibiotic medicines.  Take steroid medicines.  Are malnourished.  Have a weak body defense system (immune system).  Are 34 years of age or older.  Wear tight clothing. What are the signs or symptoms? The most common symptom of this condition is itchiness in the affected area. Other symptoms include:  Red, swollen area of the skin.  Bumps on the skin. How is this diagnosed?  This condition is diagnosed with a medical history and physical exam.  Your health care provider may check for yeast by taking light scrapings of the skin to be viewed under a microscope. How is this treated? This condition is treated with medicine. Medicines may be prescribed or be available over the counter. The medicines may be:  Taken by mouth (orally).  Applied as a cream or powder to your skin. Follow these instructions at home:   Take or apply over-the-counter and prescription medicines only as told by your health care provider.  Maintain a healthy weight. If you need help losing weight, talk with your health care provider.  Keep your skin clean and dry.  If you have diabetes, keep your blood sugar under control.  Keep all follow-up visits as told by your health care provider. This is important. Contact a health care provider if:  Your symptoms go away and then return.  Your symptoms do not get better with treatment.  Your symptoms get  worse.  Your rash spreads.  You have a fever or chills.  You have new symptoms.  You have new warmth or redness of your skin. Summary  A skin yeast infection is a condition in which there is an overgrowth of yeast (candida) that normally lives on the skin. This condition is caused by a change in the normal balance of the yeast and bacteria that live on the skin.  Take or apply over-the-counter and prescription medicines only as told by your health care provider.  Keep your skin clean and dry.  Contact a health care provider if your symptoms do not get better with treatment. This information is not intended to replace advice given to you by your health care provider. Make sure you discuss any questions you have with your health care provider. Document Revised: 06/21/2017 Document Reviewed: 06/21/2017 Elsevier Patient Education  [place]. Vaginitis Vaginitis is a condition in which the vaginal tissue swells and becomes red (inflamed). This condition is most often caused by a change in the normal balance of bacteria and yeast that live in the vagina. This change causes an overgrowth of certain bacteria or yeast, which causes the inflammation. There are different types of vaginitis, but the most common types are:  Bacterial vaginosis.  Yeast infection (candidiasis).  Trichomoniasis vaginitis. This is a sexually transmitted disease (STD).  Viral vaginitis.  Atrophic vaginitis.  Allergic vaginitis. What are the causes? The cause of this condition depends on the type of vaginitis.  It can be caused by:  Bacteria (bacterial vaginosis).  Yeast, which is a fungus (yeast infection).  A parasite (trichomoniasis vaginitis).  A virus (viral vaginitis).  Low hormone levels (atrophic vaginitis). Low hormone levels can occur during pregnancy, breastfeeding, or after menopause.  Irritants, such as bubble baths, scented tampons, and feminine sprays (allergic vaginitis). Other  factors can change the normal balance of the yeast and bacteria that live in the vagina. These include:  Antibiotic medicines.  Poor hygiene.  Diaphragms, vaginal sponges, spermicides, birth control pills, and intrauterine devices (IUD).  Sex.  Infection.  Uncontrolled diabetes.  A weakened defense (immune) system. What increases the risk? This condition is more likely to develop in women who:  Smoke.  Use vaginal douches, scented tampons, or scented sanitary pads.  Wear tight-fitting pants.  Wear thong underwear.  Use oral birth control pills or an IUD.  Have sex without a condom.  Have multiple sex partners.  Have an STD.  Frequently use the spermicide nonoxynol-9.  Eat lots of foods high in sugar.  Have uncontrolled diabetes.  Have low estrogen levels.  Have a weakened immune system from an immune disorder or medical treatment.  Are pregnant or breastfeeding. What are the signs or symptoms? Symptoms vary depending on the cause of the vaginitis. Common symptoms include:  Abnormal vaginal discharge. ? The discharge is white, gray, or yellow with bacterial vaginosis. ? The discharge is thick, white, and cheesy with a yeast infection. ? The discharge is frothy and yellow or greenish with trichomoniasis.  A bad vaginal smell. The smell is fishy with bacterial vaginosis.  Vaginal itching, pain, or swelling.  Sex that is painful.  Pain or burning when urinating. Sometimes there are no symptoms. How is this diagnosed? This condition is diagnosed based on your symptoms and medical history. A physical exam, including a pelvic exam, will also be done. You may also have other tests, including:  Tests to determine the pH level (acidity or alkalinity) of your vagina.  A whiff test, to assess the odor that results when a sample of your vaginal discharge is mixed with a potassium hydroxide solution.  Tests of vaginal fluid. A sample will be examined under a  microscope. How is this treated? Treatment varies depending on the type of vaginitis you have. Your treatment may include:  Antibiotic creams or pills to treat bacterial vaginosis and trichomoniasis.  Antifungal medicines, such as vaginal creams or suppositories, to treat a yeast infection.  Medicine to ease discomfort if you have viral vaginitis. Your sexual partner should also be treated.  Estrogen delivered in a cream, pill, suppository, or vaginal ring to treat atrophic vaginitis. If vaginal dryness occurs, lubricants and moisturizing creams may help. You may need to avoid scented soaps, sprays, or douches.  Stopping use of a product that is causing allergic vaginitis. Then using a vaginal cream to treat the symptoms. Follow these instructions at home: Lifestyle  Keep your genital area clean and dry. Avoid soap, and only rinse the area with water.  Do not douche or use tampons until your health care provider says it is okay to do so. Use sanitary pads, if needed.  Do not have sex until your health care provider approves. When you can return to sex, practice safe sex and use condoms.  Wipe from front to back. This avoids the spread of bacteria from the rectum to the vagina. General instructions  Take over-the-counter and prescription medicines only as told by your health care provider.  If you were prescribed an antibiotic medicine, take or use it as told by your health care provider. Do not stop taking or using the antibiotic even if you start to feel better.  Keep all follow-up visits as told by your health care provider. This is important. How is this prevented?  Use mild, non-scented products. Do not use things that can irritate the vagina, such as fabric softeners. Avoid the following products if they are scented: ? Feminine sprays. ? Detergents. ? Tampons. ? Feminine hygiene products. ? Soaps or bubble baths.  Let air reach your genital area. ? Wear cotton underwear  to reduce moisture buildup. ? Avoid wearing underwear while you sleep. ? Avoid wearing tight pants and underwear or nylons without a cotton panel. ? Avoid wearing thong underwear.  Take off any wet clothing, such as bathing suits, as soon as possible.  Practice safe sex and use condoms. Contact a health care provider if:  You have abdominal pain.  You have a fever.  You have symptoms that last for more than 2-3 days. Get help right away if:  You have a fever and your symptoms suddenly get worse. Summary  Vaginitis is a condition in which the vaginal tissue becomes inflamed.This condition is most often caused by a change in the normal balance of bacteria and yeast that live in the vagina.  Treatment varies depending on the type of vaginitis you have.  Do not douche, use tampons , or have sex until your health care provider approves. When you can return to sex, practice safe sex and use condoms. This information is not intended to replace advice given to you by your health care provider. Make sure you discuss any questions you have with your health care provider. Document Revised: 01/14/2017 Document Reviewed: 03/09/2016 Elsevier Patient Education  2020 [employer].

## 2019-06-19 ENCOUNTER — Ambulatory Visit
Admission: EM | Admit: 2019-06-19 | Discharge: 2019-06-19 | Disposition: A | Discharge status: Home / Self Care | Payer: BC Managed Care – PPO | Attending: Emergency Medicine | Admitting: Emergency Medicine

## 2019-06-19 ENCOUNTER — Encounter: Payer: Self-pay | Admitting: Emergency Medicine

## 2019-06-19 DIAGNOSIS — J069 Acute upper respiratory infection, unspecified: Secondary | ICD-10-CM | POA: Diagnosis not present

## 2019-06-19 DIAGNOSIS — J029 Acute pharyngitis, unspecified: Secondary | ICD-10-CM | POA: Insufficient documentation

## 2019-06-19 LAB — POCT RAPID STREP A (OFFICE): Rapid Strep A Screen: NEGATIVE

## 2019-06-19 MED ORDER — BENZONATATE 100 MG PO CAPS: 100.0000 mg | ORAL_CAPSULE | Freq: Three times a day (TID) | ORAL | 0 refills | Status: DC

## 2019-06-19 MED ORDER — FLUTICASONE PROPIONATE 50 MCG/ACT NA SUSP: 1.0000 | Freq: Every day | NASAL | 0 refills | Status: DC

## 2019-06-19 MED ORDER — CETIRIZINE HCL 10 MG PO TABS: 10.0000 mg | ORAL_TABLET | Freq: Every day | ORAL | 0 refills | Status: DC

## 2019-06-19 NOTE — Discharge Instructions (Signed)
Your COVID test is pending - it is important to quarantine / isolate at home until your results are back. °If you test positive and would like further evaluation for persistent or worsening symptoms, you may schedule an E-visit or virtual (video) visit throughout the [facility] MyChart app or website. ° °PLEASE NOTE: If you develop severe chest pain or shortness of breath please go to the ER or call 9-1-1 for further evaluation --> DO NOT schedule electronic or virtual visits for this. °Please call our office for further guidance / recommendations as needed. ° °For information about the Covid vaccine, please visit [email or web address] °

## 2019-06-19 NOTE — ED Provider Notes (Signed)
[facility]    CSN: [id number] Arrival date & time: 06/19/19  1335      History   Chief Complaint Chief Complaint  Patient presents with  . Sore Throat  . Cough  . Nausea    HPI Toni Warren is a 34 y.o. female with history of sickle cell trait, hypertension presenting for 3-day course of sore throat, dry cough, chest congestion.  Denying fever, chest pain, difficulty breathing.  Did have some nausea when coughing: No emesis.  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  Endorsing history of strep annually: States it feels similar.  Has not taken thing for symptoms.   Past Medical History:  Diagnosis Date  . Abdominal hernia   . Abnormal Pap smear of cervix 2005   HPV +, 05-26-2018 LGSIL HPV HR+  . Anemia   . Anxiety   . Chlamydia 08/18/2015   repeat testing negative October 2017.  Marland Kitchen Condyloma acuminatum of vulva   . Depression    but doesn't require meds  . GERD (gastroesophageal reflux disease)    only takes OTC meds  . History of bronchitis 3+yrs ago  . History of migraine 09/20/11-last one  . HSV-1 (herpes simplex virus 1) infection   . HSV-2 (herpes simplex virus 2) infection   . Hyperlipidemia    postpartum 2+yrs ago  . Hypertension    postpartum 2+yrs ago  . Low vitamin D level 2018  . Migraine    with aura  . Sickle cell trait ([employer])   . Sleeping difficulty   . Trichomonas infection 09/2016    Patient Active Problem List   Diagnosis Date Noted  . Female bladder prolapse 08/04/2018  . Rectocele 08/04/2018  . GERD (gastroesophageal reflux disease) 03/09/2014  . Altered bowel habits 03/09/2014  . Bloating 03/09/2014  . Helicobacter pylori ab+ 03/09/2014  . 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 Menorrhagia 03/29/2013  . Encounter for counseling 03/29/2013  . Abdominal hernia 09/29/2011  . 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 ABDOMINOPLASTY  09/29/2011   Procedure:  ABDOMINOPLASTY;  Surgeon: Theodoro Kos, DO;  Location: [facility];  Service: Plastics;  Laterality: N/A;  ABDOMINOPLASTY FOR REPAIR OF RECTUS DIASTASIS  . EYE SURGERY  1993  . INTRAUTERINE DEVICE (IUD) INSERTION     10-31-13 mirena iud inserted  . 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   . OVARIAN CYST REMOVAL  2012  . VENTRAL HERNIA REPAIR  09/29/2011   Procedure: HERNIA REPAIR VENTRAL ADULT;  Surgeon: Joyice Faster. Cornett, MD;  Location: [facility] OR;  Service: General;  Laterality: N/A;    OB History    Gravida  3   Para  2   Term  2   Preterm  0   AB  1   Living  2     SAB  0   TAB  1   Ectopic  0   Multiple  0   Live Births  2            Home Medications    Prior to Admission medications   Medication Sig Start Date End Date Taking? Authorizing Provider  acetaminophen (TYLENOL) 500 MG tablet Take 1,000 mg by mouth every 6 (six) hours as needed. For pain    [provider]  aspirin-acetaminophen-caffeine (EXCEDRIN MIGRAINE) [phone] MG per tablet Take 1 tablet by mouth daily as needed. For migraines    [provider]  benzonatate (TESSALON) 100 MG capsule Take 1 capsule (100 mg total) by mouth every 8 (eight) hours. 06/19/19   Hall-Potvin, [place], PA-C  buPROPion (WELLBUTRIN SR) 100 MG 12 hr tablet Take 100 mg by mouth every morning. 05/23/19   [provider]  cetirizine (ZYRTEC ALLERGY) 10 MG tablet Take 1 tablet (10 mg total) by mouth daily. 06/19/19   Hall-Potvin, [place], PA-C  clotrimazole (LOTRIMIN) 1 % cream Apply 1 application topically 2 (two) times daily. 05/08/19   Nunzio Cobbs, MD  fluticasone (FLONASE) 50 MCG/ACT nasal spray Place 1 spray into both nostrils daily. 06/19/19   Hall-Potvin, [place], PA-C  ibuprofen (ADVIL) 800 MG tablet Take 1 tablet (800 mg total) by mouth 3 (three) times daily. 04/26/19   Wieters, Hallie C, PA-C  norethindrone (AYGESTIN) 5 MG tablet Take 5 mg by mouth daily. 05/28/19   [provider]  omeprazole (PRILOSEC) 40 MG capsule Take 1 capsule by mouth as needed. 03/25/16   [provider]  propranolol ER (INDERAL LA) 60 MG 24 hr capsule Take 60 mg by mouth daily. 05/23/19   [provider]  sertraline (ZOLOFT) 50 MG tablet TK 1 T PO QD 09/12/18   [provider]  norethindrone (MICRONOR) 0.35 MG tablet Take 1 tablet (0.35 mg total) by mouth daily. 04/11/19 04/26/19  Nunzio Cobbs, MD    Family History Family History  Problem Relation Age of Onset  . Diabetes Mother   . Hypertension Mother   . Hypertension Father     Social History Social History   Tobacco Use  . Smoking status: Former Smoker    Types: Cigars    Quit date: 09/21/2006    Years since quitting: 12.7  . Smokeless tobacco: Never Used  Substance Use Topics  . Alcohol use: Not Currently    Alcohol/week: 0.0 standard drinks    Comment: occas  . Drug use: No     Allergies   Other, Percocet [oxycodone-acetaminophen], and Valtrex [valacyclovir hcl]   Review of Systems As per HPI   Physical Exam Triage Vital Signs ED Triage Vitals  Enc Vitals Group     BP 06/19/19 1336 126/86     Pulse Rate 06/19/19 1336 75     Resp 06/19/19 1336 18     Temp 06/19/19 1336 97.9 F (36.6 C)     Temp Source 06/19/19 1336 Oral     SpO2 06/19/19 1336 97 %     Weight --      Height --      Head Circumference --      Peak Flow --      Pain Score 06/19/19 1341 8     Pain Loc --      Pain Edu? --      Excl. in [place]? --    No data found.  Updated Vital Signs BP 126/86 (BP Location: Left Arm)   Pulse 75   Temp 97.9 F (36.6 C) (Oral)   Resp 18   LMP 05/14/2019   SpO2 97%   Visual Acuity Right Eye Distance:   Left Eye Distance:   Bilateral Distance:    Right Eye Near:   Left Eye Near:    Bilateral Near:     Physical Exam Constitutional:      General: She is not in acute distress.    Appearance: She is well-developed. She is not ill-appearing.  HENT:     Head:  Normocephalic and atraumatic.  Jaw: There is normal jaw occlusion. No tenderness or pain on movement.     Right Ear: Hearing, tympanic membrane, ear canal and external ear normal. No tenderness. No mastoid tenderness.     Left Ear: Hearing, tympanic membrane, ear canal and external ear normal. No tenderness. No mastoid tenderness.     Nose: No nasal deformity, septal deviation, nasal tenderness, congestion or rhinorrhea.     Right Turbinates: Not swollen or pale.     Left Turbinates: Not swollen or pale.     Right Sinus: No maxillary sinus tenderness or frontal sinus tenderness.     Left Sinus: No maxillary sinus tenderness or frontal sinus tenderness.     Mouth/Throat:     Lips: Pink. No lesions.     Mouth: Mucous membranes are moist. No injury.     Pharynx: Oropharynx is clear. Uvula midline. No posterior oropharyngeal erythema or uvula swelling.     Tonsils: Tonsillar exudate present. 2+ on the right. 2+ on the left.  Eyes:     Conjunctiva/sclera: Conjunctivae normal.     Pupils: Pupils are equal, round, and reactive to light.  Cardiovascular:     Rate and Rhythm: Normal rate and regular rhythm.  Pulmonary:     Effort: Pulmonary effort is normal.  Musculoskeletal:     Cervical back: Normal range of motion and neck supple. No muscular tenderness.  Lymphadenopathy:     Cervical: No cervical adenopathy.  Neurological:     Mental Status: She is alert and oriented to person, place, and time.      UC Treatments / Results  Labs (all labs ordered are listed, but only abnormal results are displayed) Labs Reviewed  NOVEL CORONAVIRUS, NAA  CULTURE, GROUP A STREP [facility])  POCT RAPID STREP A (OFFICE)    EKG   Radiology No results found.  Procedures Procedures (including critical care time)  Medications Ordered in UC Medications - No data to display  Initial Impression / Assessment and Plan / UC Course  I have reviewed the triage vital signs and the nursing  notes.  Pertinent labs & imaging results that were available during my care of the patient were reviewed by me and considered in my medical decision making (see chart for details).     Patient afebrile, nontoxic, with SpO2 97%.  Rapid strep negative, culture pending.  Covid PCR pending.  Patient to quarantine until results are back.  We will treat supportively as outlined below.  Return precautions discussed, patient verbalized understanding and is agreeable to plan. Final Clinical Impressions(s) / UC Diagnoses   Final diagnoses:  URI with cough and congestion  Sore throat     Discharge Instructions     Your COVID test is pending - it is important to quarantine / isolate at home until your results are back. If you test positive and would like further evaluation for persistent or worsening symptoms, you may schedule an E-visit or virtual (video) visit throughout the [facility] app or website.  PLEASE NOTE: If you develop severe chest pain or shortness of breath please go to the ER or call 9-1-1 for further evaluation --> DO NOT schedule electronic or virtual visits for this. Please call our office for further guidance / recommendations as needed.  For information about the Covid vaccine, please visit [email or web address]    ED Prescriptions    Medication Sig Dispense Auth. Provider   cetirizine (ZYRTEC ALLERGY) 10 MG tablet Take 1 tablet (10 mg total) by mouth daily. [place]  tablet Hall-Potvin, [place], PA-C   fluticasone (FLONASE) 50 MCG/ACT nasal spray Place 1 spray into both nostrils daily. 16 g Hall-Potvin, [place], PA-C   benzonatate (TESSALON) 100 MG capsule Take 1 capsule (100 mg total) by mouth every 8 (eight) hours. 21 capsule Hall-Potvin, [place], PA-C     PDMP not reviewed this encounter.   Hall-Potvin, [place], [place] 06/19/19 1413

## 2019-06-19 NOTE — ED Triage Notes (Signed)
Patient reports 3 days of sore throat, dry cough started yesterday with nausea, and states mild discomfort in chest but is not short of breath. No fevers. Patient is a Pharmacist, hospital.

## 2019-06-20 LAB — NOVEL CORONAVIRUS, NAA: SARS-CoV-2, NAA: NOT DETECTED

## 2019-06-20 LAB — SARS-COV-2, NAA 2 DAY TAT

## 2019-06-21 LAB — CULTURE, GROUP A STREP (THRC)

## 2019-07-27 ENCOUNTER — Telehealth: Payer: Self-pay | Admitting: Obstetrics and Gynecology

## 2019-07-27 NOTE — Telephone Encounter (Signed)
Patient would like to schedule pregnancy confirmation. Requesting Monday or Tuesday if possible.

## 2019-07-27 NOTE — Telephone Encounter (Signed)
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   Spoke with pt.Pt states taking UPT at home today due to having pregnancy symptoms of nausea. Pt states positive UPT x 2.  Pt asking to come for OV for confirmation. Pt had LMP 4/30 with regular flow. Pt also states thinks had lighter cycle on /22.  Pt only complaint today is nausea, but is manageable. Able to eat and drink.  Pt denies any heavy bleeding, clots, abd pain or cramping. Pt advised heavy bleeding precautions. Pt agreeable. Pt advised for OV, Declines earlier appt due to going out of town.  OV scheduled with Dr Quincy Simmonds on 08/09/19 at 45 am. Pt verbalized understanding and agreeable to date and time.  Routing to Dr Quincy Simmonds for review.  Encounter closed.

## 2019-07-28 ENCOUNTER — Telehealth: Payer: BC Managed Care – PPO | Admitting: Physician Assistant

## 2019-07-28 DIAGNOSIS — N760 Acute vaginitis: Secondary | ICD-10-CM | POA: Diagnosis not present

## 2019-07-28 MED ORDER — METRONIDAZOLE 0.75 % VA GEL: 1.0000 | Freq: Every day | VAGINAL | 0 refills | Status: DC

## 2019-07-28 NOTE — Progress Notes (Signed)
We are sorry that you are not feeling well. Here is how we plan to help! Based on what you shared with me it looks like you: May have a vaginosis due to bacteria  Vaginosis is an inflammation of the vagina that can result in discharge, itching and pain. The cause is usually a change in the normal balance of vaginal bacteria or an infection. Vaginosis can also result from reduced estrogen levels after menopause.  The most common causes of vaginosis are:   Bacterial vaginosis which results from an overgrowth of one on several organisms that are normally present in your vagina.   Yeast infections which are caused by a naturally occurring fungus called candida.   Vaginal atrophy (atrophic vaginosis) which results from the thinning of the vagina from reduced estrogen levels after menopause.   Trichomoniasis which is caused by a parasite and is commonly transmitted by sexual intercourse.  Factors that increase your risk of developing vaginosis include: Marland Kitchen Medications, such as antibiotics and steroids . Uncontrolled diabetes . Use of hygiene products such as bubble bath, vaginal spray or vaginal deodorant . Douching . Wearing damp or tight-fitting clothing . Using an intrauterine device (IUD) for birth control . Hormonal changes, such as those associated with pregnancy, birth control pills or menopause . Sexual activity . Having a sexually transmitted infection  Your treatment plan is Metrogel vaginal gel at bedtime x 5 nights.   Be sure to take all of the medication as directed. Stop taking any medication if you develop a rash, tongue swelling or shortness of breath. Mothers who are breast feeding should consider pumping and discarding their breast milk while on these antibiotics. However, there is no consensus that infant exposure at these doses would be harmful.  Remember that medication creams can weaken latex condoms.   HOME CARE:  Good hygiene may prevent some types of vaginosis from  recurring and may relieve some symptoms:  . Avoid baths, hot tubs and whirlpool spas. Rinse soap from your outer genital area after a shower, and dry the area well to prevent irritation. Don't use scented or harsh soaps, such as those with deodorant or antibacterial action. Marland Kitchen Avoid irritants. These include scented tampons and pads. . Wipe from front to back after using the toilet. Doing so avoids spreading fecal bacteria to your vagina.  Other things that may help prevent vaginosis include:  Marland Kitchen Don't douche. Your vagina doesn't require cleansing other than normal bathing. Repetitive douching disrupts the normal organisms that reside in the vagina and can actually increase your risk of vaginal infection. Douching won't clear up a vaginal infection. . Use a latex condom. Both female and female latex condoms may help you avoid infections spread by sexual contact. . Wear cotton underwear. Also wear pantyhose with a cotton crotch. If you feel comfortable without it, skip wearing underwear to bed. Yeast thrives in [employer] Your symptoms should improve in the next day or two.  GET HELP RIGHT AWAY IF:  . You have pain in your lower abdomen ( pelvic area or over your ovaries) . You develop nausea or vomiting . You develop a fever . Your discharge changes or worsens . You have persistent pain with intercourse . You develop shortness of breath, a rapid pulse, or you faint.  These symptoms could be signs of problems or infections that need to be evaluated by a medical provider now.  MAKE SURE YOU    Understand these instructions.  Will watch your condition.  Will  get help right away if you are not doing well or get worse.  Your e-visit answers were reviewed by a board certified advanced clinical practitioner to complete your personal care plan. Depending upon the condition, your plan could have included both over the counter or prescription medications. Please review your pharmacy choice  to make sure that you have choses a pharmacy that is open for you to pick up any needed prescription, Your safety is important to [place]. If you have drug allergies check your prescription carefully.   You can use MyChart to ask questions about today's visit, request a non-urgent call back, or ask for a work or school excuse for 24 hours related to this e-Visit. If it has been greater than 24 hours you will need to follow up with your provider, or enter a new e-Visit to address those concerns. You will get a MyChart message within the next two days asking about your experience. I hope that your e-visit has been valuable and will speed your recovery.  Greater than 5 minutes, yet less than 10 minutes of time have been spent researching, coordinating and implementing care for this patient today.

## 2019-07-29 ENCOUNTER — Telehealth: Payer: BC Managed Care – PPO

## 2019-07-30 ENCOUNTER — Telehealth: Payer: Self-pay | Admitting: Obstetrics and Gynecology

## 2019-07-30 ENCOUNTER — Ambulatory Visit (INDEPENDENT_AMBULATORY_CARE_PROVIDER_SITE_OTHER): Payer: BC Managed Care – PPO | Admitting: Obstetrics and Gynecology

## 2019-07-30 ENCOUNTER — Other Ambulatory Visit: Payer: Self-pay

## 2019-07-30 ENCOUNTER — Encounter: Payer: Self-pay | Admitting: Obstetrics and Gynecology

## 2019-07-30 VITALS — BP 138/86 | HR 110 | Temp 97.2°F | Ht 62.0 in | Wt 178.8 lb

## 2019-07-30 DIAGNOSIS — N760 Acute vaginitis: Secondary | ICD-10-CM

## 2019-07-30 DIAGNOSIS — N926 Irregular menstruation, unspecified: Secondary | ICD-10-CM | POA: Diagnosis not present

## 2019-07-30 DIAGNOSIS — B9689 Other specified bacterial agents as the cause of diseases classified elsewhere: Secondary | ICD-10-CM

## 2019-07-30 LAB — POCT URINE PREGNANCY: Preg Test, Ur: POSITIVE — AB

## 2019-07-30 MED ORDER — METRONIDAZOLE 500 MG PO TABS: 500.0000 mg | ORAL_TABLET | Freq: Two times a day (BID) | ORAL | 0 refills | Status: DC

## 2019-07-30 NOTE — Telephone Encounter (Signed)
Spoke with pt. Pt states going to Urgent care on Saturday for vaginal and urinary sx. Pt states was +BV. Pt given Metrogel for treatment. Pt states picked up Rx on Sunday and only used last night as first time. Pt states wanting to know if safe to take for treatment given she is newly +pregnant. Pt advised to discuss more about medication and reschedule pregnancy confirmation appt per Dr Quincy Simmonds. Pt agreeable. Pt scheduled today 6/14 at 2:30 pm with Dr Quincy Simmonds. Pt verbalized understanding and is agreeable. CPS neg.   Routing to Dr Quincy Simmonds  Encounter closed.  See previous encounter dated 07/27/19.

## 2019-07-30 NOTE — Telephone Encounter (Signed)
Patient needs to come to the office sooner for office visit and serial quantitative hCGs. She has a history of chlamydia and is at risk for ectopic pregnancy.

## 2019-07-30 NOTE — Progress Notes (Signed)
GYNECOLOGY  VISIT   HPI: 34 y.o.   Single  African American  female   [phone] with Patient's last menstrual period was 07/07/2019 (exact date).   here for pregnancy confirmation.    Patient's LNMP 06-14-19, bled 07-07-19.  States some mild cramping, midline.  No bleeding since 07/07/19.  Patient complaining of vaginal discharge with odor.  [facility] in follow up. Prescribed Metrogel, which she used last pm. She has a yellow spongy discharge.   Hx gestational diabetes and pre-eclampsia/HELLP syndrome with prior pregnancy.  New FOB.  Going to [facility] in 2 days.   UPT: Pos  GYNECOLOGIC HISTORY: Patient's last menstrual period was 07/07/2019 (exact date). Contraception:  none Menopausal hormone therapy:  n/a Last mammogram: 07-05-14 Lt.Br.U/S/Neg/biRads1--screening age 34 Last pap smear: 12-21-18 Neg:Pos HR HPV,05-26-18 LSIL:Pos HR HPV,7/6/20high grade dysplasia, CIN II, with positive margins, ECC benign; 06/27/18 LSIL;06/19/18 Colpo showed high gradedysplasia        OB History    Gravida  3   Para  2   Term  2   Preterm  0   AB  1   Living  2     SAB  0   TAB  1   Ectopic  0   Multiple  0   Live Births  2              Patient Active Problem List   Diagnosis Date Noted  . Female bladder prolapse 08/04/2018  . Rectocele 08/04/2018  . GERD (gastroesophageal reflux disease) 03/09/2014  . Altered bowel habits 03/09/2014  . Bloating 03/09/2014  . Helicobacter pylori ab+ 03/09/2014  . 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 Menorrhagia 03/29/2013  . Encounter for counseling 03/29/2013  . Abdominal hernia 09/29/2011  . 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 Abdominal hernia   . Abnormal Pap smear of cervix 2005   HPV +, 05-26-2018 LGSIL HPV HR+  . Anemia   . Anxiety   . Chlamydia 08/18/2015   repeat testing negative October 2017.  Marland Kitchen Condyloma acuminatum of vulva   .  Depression    but doesn't require meds  . GERD (gastroesophageal reflux disease)    only takes OTC meds  . History of bronchitis 3+yrs ago  . History of migraine 09/20/11-last one  . HSV-1 (herpes simplex virus 1) infection   . HSV-2 (herpes simplex virus 2) infection   . Hyperlipidemia    postpartum 2+yrs ago  . Hypertension    postpartum 2+yrs ago  . Low vitamin D level 2018  . Migraine    with aura  . Sickle cell trait ([place])   . Sleeping difficulty   . Trichomonas infection 09/2016    Past Surgical History:  Procedure Laterality Date  . ABDOMINOPLASTY  09/29/2011   Procedure: ABDOMINOPLASTY;  Surgeon: Theodoro Kos, DO;  Location: [facility];  Service: Plastics;  Laterality: N/A;  ABDOMINOPLASTY FOR REPAIR OF RECTUS DIASTASIS  . EYE SURGERY  1993  . INTRAUTERINE DEVICE (IUD) INSERTION     10-31-13 mirena iud inserted  . 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   . OVARIAN CYST REMOVAL  2012  . VENTRAL HERNIA REPAIR  09/29/2011   Procedure: HERNIA REPAIR VENTRAL ADULT;  Surgeon: Joyice Faster. Cornett, MD;  Location: [facility];  Service: General;  Laterality: N/A;    Current Outpatient Medications  Medication Sig Dispense Refill  .  acetaminophen (TYLENOL) 500 MG tablet Take 1,000 mg by mouth every 6 (six) hours as needed. For pain    . aspirin-acetaminophen-caffeine (EXCEDRIN MIGRAINE) 250-250-65 MG per tablet Take 1 tablet by mouth daily as needed. For migraines    . buPROPion (WELLBUTRIN SR) 100 MG 12 hr tablet Take 100 mg by mouth every morning.    . clotrimazole (LOTRIMIN) 1 % cream Apply 1 application topically 2 (two) times daily. 30 g 0  . ibuprofen (ADVIL) 800 MG tablet Take 1 tablet (800 mg total) by mouth 3 (three) times daily. 21 tablet 0  . metroNIDAZOLE (METROGEL VAGINAL) 0.75 % vaginal gel Place 1 Applicatorful vaginally at bedtime for 5 days. 70 g 0  . omeprazole (PRILOSEC) 40 MG capsule Take 1 capsule by mouth as needed.  0  . propranolol ER (INDERAL LA) 60 MG 24 hr  capsule Take 60 mg by mouth daily.     No current facility-administered medications for this visit.     ALLERGIES: Other, Percocet [oxycodone-acetaminophen], and Valtrex [valacyclovir hcl]  Family History  Problem Relation Age of Onset  . Diabetes Mother   . Hypertension Mother   . Hypertension Father     Social History   Socioeconomic History  . Marital status: Single    Spouse name: Not on file  . Number of children: 2  . Years of education: Not on file  . Highest education level: Not on file  Occupational History  . Not on file  Tobacco Use  . Smoking status: Former Smoker    Types: Cigars    Quit date: 09/21/2006    Years since quitting: 12.8  . Smokeless tobacco: Never Used  Vaping Use  . Vaping Use: Never used  Substance and Sexual Activity  . Alcohol use: Not Currently    Alcohol/week: 0.0 standard drinks    Comment: occas  . Drug use: No  . Sexual activity: Yes    Partners: Male    Birth control/protection: Pill    Comment: Micronor  Other Topics Concern  . Not on file  Social History Narrative  . Not on file   Social Determinants of Health   Financial Resource Strain:   . Difficulty of Paying Living Expenses:   Food Insecurity:   . Worried About Charity fundraiser in the Last Year:   . Arboriculturist in the Last Year:   Transportation Needs:   . Film/video editor (Medical):   Marland Kitchen Lack of Transportation (Non-Medical):   Physical Activity:   . Days of Exercise per Week:   . Minutes of Exercise per Session:   Stress:   . Feeling of Stress :   Social Connections:   . Frequency of Communication with Friends and Family:   . Frequency of Social Gatherings with Friends and Family:   . Attends Religious Services:   . Active Member of Clubs or Organizations:   . Attends Archivist Meetings:   Marland Kitchen Marital Status:   Intimate Partner Violence:   . Fear of Current or Ex-Partner:   . Emotionally Abused:   Marland Kitchen Physically Abused:   . Sexually  Abused:     Review of Systems  All other systems reviewed and are negative.   PHYSICAL EXAMINATION:    BP 138/86   Pulse (!) 110   Temp (!) 97.2 F (36.2 C) (Temporal)   Ht 5\' 2"  (1.575 m)   Wt 178 lb 12.8 oz (81.1 kg)   LMP 07/07/2019 (Exact Date)  BMI 32.70 kg/m     General appearance: alert, cooperative and appears stated age  Pelvic: External genitalia:  no lesions              Urethra:  normal appearing urethra with no masses, tenderness or lesions              Bartholins and Skenes: normal                 Vagina: normal appearing vagina with normal color and discharge, no lesions              Cervix: no lesions                Bimanual Exam:  Uterus:  normal size, contour, position, consistency, mobility, non-tender              Adnexa: no mass, fullness, tenderness          Chaperone was present for exam.  ASSESSMENT  Early pregnancy.  Hx chlamydia.  BV.  Hx GDM.  Hx Pre-eclampsia/HELLP.  Slightly elevated blood pressure.  On Effexor.   PLAN  Switch Metrogel to Flagyl.  Wean off Wellbutrin.  Not taking Propronolol Switch to prenatal vitamin. We discussed avoiding exposures.  Will check serial hCGs today and tomorrow due to her travel plans.  Ectopic precautions.  She will wean off Effexor.    An After Visit Summary was printed and given to the patient.  _29____ minutes consultation.

## 2019-07-30 NOTE — Telephone Encounter (Signed)
Patient has a question for a nurse regarding her medication.

## 2019-07-30 NOTE — Telephone Encounter (Signed)
Spoke with pt. Pt given update about appt per Dr Quincy Simmonds. Pt agreeable. Pt rescheduled for today 07/30/19 at 2:30 pm. Pt verbalized understanding and is agreeable. CPS neg.   Routing to Dr Quincy Simmonds.  Encounter closed.

## 2019-07-31 ENCOUNTER — Other Ambulatory Visit: Payer: Self-pay

## 2019-07-31 ENCOUNTER — Other Ambulatory Visit (INDEPENDENT_AMBULATORY_CARE_PROVIDER_SITE_OTHER): Payer: BC Managed Care – PPO

## 2019-07-31 ENCOUNTER — Telehealth: Payer: Self-pay | Admitting: *Deleted

## 2019-07-31 DIAGNOSIS — N926 Irregular menstruation, unspecified: Secondary | ICD-10-CM

## 2019-07-31 DIAGNOSIS — Z3201 Encounter for pregnancy test, result positive: Secondary | ICD-10-CM

## 2019-07-31 LAB — BETA HCG QUANT (REF LAB)
hCG Quant: 190 m[IU]/mL
hCG Quant: 248 m[IU]/mL

## 2019-07-31 NOTE — Telephone Encounter (Signed)
I do recommend she wean off the Effexor.  It can contribute to hypertension in pregnancy.  Nothing further needs to be done with her blood pressure at this time. She needs to hydrate well during pregnancy and this will help to control her pulse rate.

## 2019-07-31 NOTE — Telephone Encounter (Signed)
07/31/19 STAT Beta Hcg 248  Reviewed with Dr. Quincy Simmonds.  Appropriate rise in hcg. OK for normal activity. OB U/S 08/16/19.   Call placed to patient. Advised as seen above per Dr. Quincy Simmonds. Patient request to proceed with PUS at [facility]. PUS scheduled for 08/16/19 at 9am, consult to follow at 9:30am. Order placed for precert. ER precautions reviewed for vaginal bleeding or pain.   Patient reports B/p was 138/86 and pulse 110 during OV on 07/30/19. Patient asking if there are any concerns or anything she needs to f/u with in the meantime? Patient asking if any relation to Wellbutrin? She states she discussed weaning off of Wellbutrin during OV.  Patient denies any current symptoms or concerns.   Advised I will forward to Dr. Quincy Simmonds to review, our office will f/u with recommendations.   Routing to Dr. Quincy Simmonds.   Cc: Hayley Carder

## 2019-08-01 ENCOUNTER — Other Ambulatory Visit: Payer: Self-pay | Admitting: *Deleted

## 2019-08-01 DIAGNOSIS — N926 Irregular menstruation, unspecified: Secondary | ICD-10-CM

## 2019-08-01 DIAGNOSIS — Z3201 Encounter for pregnancy test, result positive: Secondary | ICD-10-CM

## 2019-08-01 NOTE — Telephone Encounter (Signed)
Spoke with patient, advised as seen below per Dr. Quincy Simmonds. Patient verbalzies understanding and is agreeable.   Encounter closed.

## 2019-08-09 ENCOUNTER — Ambulatory Visit: Payer: Self-pay | Admitting: Obstetrics and Gynecology

## 2019-08-09 ENCOUNTER — Telehealth: Payer: Self-pay

## 2019-08-09 NOTE — Telephone Encounter (Signed)
Spoke with pt. Pt states having +BV prior to OV  07/30/19 at Urgent care and was switched to using Flagyl Rx by Dr Quincy Simmonds. Pt states having vaginal itching x 2 days now since finishing Flagyl on 08/05/19. Pt denies any vaginal discharge, odor, fever, chills and all UTI sx.  Pt is pregnant between 5-6 weeks.  Pt requesting yeast RX or can she can OTC monistat for sx?   Advised will review with Dr Quincy Simmonds and return call. Pt agreeable.   Routing to Dr Quincy Simmonds.

## 2019-08-09 NOTE — Telephone Encounter (Signed)
Reviewed phone encounter with Dr Sabra Heck since Dr Quincy Simmonds out of office.  Per Up to date and Dr Ammie Ferrier recommendations, pt to use OTC monistat 7 days.   Call placed back to pt. Left detailed message per DPR, name identified on VM. Pt to return call to office with any questions or concerns. Pt to call for OV if doesn't resolve sx completely.   Routing to Dr Quincy Simmonds for review.  Encounter closed.

## 2019-08-09 NOTE — Telephone Encounter (Signed)
Patient is calling in regards to wanting a prescription filled for itchiness after using antibiotics.

## 2019-08-10 ENCOUNTER — Telehealth: Payer: Self-pay

## 2019-08-10 NOTE — Telephone Encounter (Signed)
I agree with PCP appointment.   I recommend cancelling her [place] appointment at our office.  She will need to do this with her OB office as she is establishing care there next week.   I wish her well with the pregnancy! I will look forward to her return after she completes her delivery and post partum care.

## 2019-08-10 NOTE — Telephone Encounter (Signed)
This encounter closed.  See previous encounter 08/09/19.

## 2019-08-10 NOTE — Telephone Encounter (Signed)
Spoke with pt. Pt calling this morning with other symptoms that she is having besides her yeast symptoms that she called about on 08/09/19. Pt updated on OTC Monistat 7 days per Dr Sabra Heck that reviewed her chart. Pt agreeable.   Pt states had diarrhea last night with chills x 3, unsure of fever. Pt also states she has had decreased appetite and nausea. Pt denies feeling dizzy, weak and lightheaded. Pt has not taken any OTC meds at this point for nausea or diarrhea. Pt denies any new foods or being around anyone sick.   Pt aware that Dr Quincy Simmonds not in office today.  Pt advised to be seen by PCP due to symptoms of diarrhea and nausea that is possible sickness and to have yeast infection symptoms evaluated. Pt agreeable and verbalized understanding.   Pt states has new OB appt with RN only for paperwork and labs on 08/13/19. Pt has scheduled viability PUS on 08/16/19 here in our office.   Routing to Dr Quincy Simmonds for review.  Encounter closed.

## 2019-08-10 NOTE — Telephone Encounter (Signed)
Spoke back with pt. Pt given update and recommendations per Dr Quincy Simmonds. Pt agreeable and verbalized understanding. Pt PUS cancelled on 08/16/19. Pt states waiting call back from PCP for visit today.   Routing to Dr Quincy Simmonds.  Encounter closed.

## 2019-08-10 NOTE — Telephone Encounter (Signed)
Patient is calling in regards to wanting to speak with nurse about symptoms she's experiencing.

## 2019-08-13 LAB — OB RESULTS CONSOLE HEPATITIS B SURFACE ANTIGEN: Hepatitis B Surface Ag: NEGATIVE

## 2019-08-13 LAB — OB RESULTS CONSOLE RUBELLA ANTIBODY, IGM: Rubella: IMMUNE

## 2019-08-13 LAB — OB RESULTS CONSOLE HIV ANTIBODY (ROUTINE TESTING): HIV: NONREACTIVE

## 2019-08-16 ENCOUNTER — Other Ambulatory Visit: Payer: Self-pay | Admitting: Obstetrics and Gynecology

## 2019-08-16 ENCOUNTER — Other Ambulatory Visit: Payer: Self-pay

## 2019-09-09 ENCOUNTER — Inpatient Hospital Stay (HOSPITAL_COMMUNITY)
Admission: AD | Admit: 2019-09-09 | Discharge: 2019-09-09 | Disposition: A | Discharge status: Home / Self Care | Payer: BC Managed Care – PPO | Attending: Obstetrics and Gynecology | Admitting: Obstetrics and Gynecology

## 2019-09-09 ENCOUNTER — Other Ambulatory Visit: Payer: Self-pay

## 2019-09-09 ENCOUNTER — Encounter (HOSPITAL_COMMUNITY): Payer: Self-pay | Admitting: Obstetrics and Gynecology

## 2019-09-09 ENCOUNTER — Inpatient Hospital Stay (HOSPITAL_COMMUNITY): Payer: BC Managed Care – PPO

## 2019-09-09 DIAGNOSIS — O10911 Unspecified pre-existing hypertension complicating pregnancy, first trimester: Secondary | ICD-10-CM | POA: Insufficient documentation

## 2019-09-09 DIAGNOSIS — O99611 Diseases of the digestive system complicating pregnancy, first trimester: Secondary | ICD-10-CM | POA: Insufficient documentation

## 2019-09-09 DIAGNOSIS — F329 Major depressive disorder, single episode, unspecified: Secondary | ICD-10-CM | POA: Diagnosis not present

## 2019-09-09 DIAGNOSIS — O468X1 Other antepartum hemorrhage, first trimester: Secondary | ICD-10-CM | POA: Diagnosis not present

## 2019-09-09 DIAGNOSIS — Z3A10 10 weeks gestation of pregnancy: Secondary | ICD-10-CM | POA: Diagnosis not present

## 2019-09-09 DIAGNOSIS — D573 Sickle-cell trait: Secondary | ICD-10-CM | POA: Insufficient documentation

## 2019-09-09 DIAGNOSIS — R109 Unspecified abdominal pain: Secondary | ICD-10-CM | POA: Diagnosis not present

## 2019-09-09 DIAGNOSIS — O26899 Other specified pregnancy related conditions, unspecified trimester: Secondary | ICD-10-CM

## 2019-09-09 DIAGNOSIS — O208 Other hemorrhage in early pregnancy: Secondary | ICD-10-CM | POA: Insufficient documentation

## 2019-09-09 DIAGNOSIS — Z87891 Personal history of nicotine dependence: Secondary | ICD-10-CM | POA: Diagnosis not present

## 2019-09-09 DIAGNOSIS — O161 Unspecified maternal hypertension, first trimester: Secondary | ICD-10-CM

## 2019-09-09 DIAGNOSIS — O99341 Other mental disorders complicating pregnancy, first trimester: Secondary | ICD-10-CM | POA: Insufficient documentation

## 2019-09-09 DIAGNOSIS — O26891 Other specified pregnancy related conditions, first trimester: Secondary | ICD-10-CM | POA: Insufficient documentation

## 2019-09-09 DIAGNOSIS — O418X10 Other specified disorders of amniotic fluid and membranes, first trimester, not applicable or unspecified: Secondary | ICD-10-CM

## 2019-09-09 DIAGNOSIS — F419 Anxiety disorder, unspecified: Secondary | ICD-10-CM | POA: Insufficient documentation

## 2019-09-09 DIAGNOSIS — Z79899 Other long term (current) drug therapy: Secondary | ICD-10-CM | POA: Diagnosis not present

## 2019-09-09 DIAGNOSIS — K219 Gastro-esophageal reflux disease without esophagitis: Secondary | ICD-10-CM | POA: Insufficient documentation

## 2019-09-09 DIAGNOSIS — O219 Vomiting of pregnancy, unspecified: Secondary | ICD-10-CM | POA: Diagnosis not present

## 2019-09-09 DIAGNOSIS — Z3491 Encounter for supervision of normal pregnancy, unspecified, first trimester: Secondary | ICD-10-CM

## 2019-09-09 LAB — URINALYSIS, ROUTINE W REFLEX MICROSCOPIC
Bilirubin Urine: NEGATIVE
Glucose, UA: NEGATIVE mg/dL
Ketones, ur: NEGATIVE mg/dL
Leukocytes,Ua: NEGATIVE
Nitrite: NEGATIVE
Protein, ur: NEGATIVE mg/dL
Specific Gravity, Urine: 1.025 (ref 1.005–1.030)
pH: 5.5 (ref 5.0–8.0)

## 2019-09-09 LAB — URINALYSIS, MICROSCOPIC (REFLEX)

## 2019-09-09 LAB — CBC
HCT: 40.5 % (ref 36.0–46.0)
Hemoglobin: 13.7 g/dL (ref 12.0–15.0)
MCH: 28.9 pg (ref 26.0–34.0)
MCHC: 33.8 g/dL (ref 30.0–36.0)
MCV: 85.4 fL (ref 80.0–100.0)
Platelets: 370 10*3/uL (ref 150–400)
RBC: 4.74 MIL/uL (ref 3.87–5.11)
RDW: 12.8 % (ref 11.5–15.5)
WBC: 6.3 10*3/uL (ref 4.0–10.5)
nRBC: 0 % (ref 0.0–0.2)

## 2019-09-09 LAB — WET PREP, GENITAL
Sperm: NONE SEEN
Trich, Wet Prep: NONE SEEN
Yeast Wet Prep HPF POC: NONE SEEN

## 2019-09-09 LAB — HCG, QUANTITATIVE, PREGNANCY: hCG, Beta Chain, Quant, S: 140129 m[IU]/mL — ABNORMAL HIGH (ref <5)

## 2019-09-09 MED ORDER — ONDANSETRON HCL 4 MG/2ML IJ SOLN
4.0000 mg | Freq: Once | INTRAMUSCULAR | Status: AC
  Administered 2019-09-09: 4 mg via INTRAVENOUS
  Filled 2019-09-09: qty 2

## 2019-09-09 MED ORDER — LACTATED RINGERS IV BOLUS
1000.0000 mL | Freq: Once | INTRAVENOUS | Status: AC
  Administered 2019-09-09: 1000 mL via INTRAVENOUS

## 2019-09-09 MED ORDER — METRONIDAZOLE 0.75 % VA GEL: 1.0000 | Freq: Two times a day (BID) | VAGINAL | 0 refills | Status: DC

## 2019-09-09 MED ORDER — ONDANSETRON 4 MG PO TBDP
4.0000 mg | ORAL_TABLET | Freq: Three times a day (TID) | ORAL | 1 refills | Status: DC | PRN
Start: 2019-09-09 — End: 2021-06-16

## 2019-09-09 NOTE — MAU Provider Note (Signed)
History     CSN: [id number]  Arrival date and time: 09/09/19 1349   First Provider Initiated Contact with Patient 09/09/19 1504      Chief Complaint  Patient presents with  . Emesis  . Diarrhea  . Back Pain  . Hypertension   HPI Toni Warren is a 34 y.o. [id number] at [redacted]w[redacted]d who presents with elevated BPs at home and a headache. She states she noticed the HA that she rates a 4/10 and thought it was her BP. She states at home her BP was in the 140s/90s. She also reports nausea and vomiting since she found out she was pregnant. She has phenergan but states it makes her too sleepy so she doesn't take it. She also reports some loose stools today.   OB History    Gravida  4   Para  2   Term  2   Preterm  0   AB  1   Living  2     SAB  0   TAB  1   Ectopic  0   Multiple  0   Live Births  2           Past Medical History:  Diagnosis Date  . Abdominal hernia   . Abnormal Pap smear of cervix 2005   HPV +, 05-26-2018 LGSIL HPV HR+  . Anemia   . Anxiety   . Chlamydia 08/18/2015   repeat testing negative October 2017.  Marland Kitchen Condyloma acuminatum of vulva   . Depression    but doesn't require meds  . GERD (gastroesophageal reflux disease)    only takes OTC meds  . History of bronchitis 3+yrs ago  . History of migraine 09/20/11-last one  . HSV-1 (herpes simplex virus 1) infection   . HSV-2 (herpes simplex virus 2) infection   . Hyperlipidemia    postpartum 2+yrs ago  . Hypertension    postpartum 2+yrs ago  . Low vitamin D level 2018  . Migraine    with aura  . Sickle cell trait ([place])   . Sleeping difficulty   . Trichomonas infection 09/2016    Past Surgical History:  Procedure Laterality Date  . ABDOMINOPLASTY  09/29/2011   Procedure: ABDOMINOPLASTY;  Surgeon: Theodoro Kos, DO;  Location: [facility];  Service: Plastics;  Laterality: N/A;  ABDOMINOPLASTY FOR REPAIR OF RECTUS DIASTASIS  . EYE SURGERY  1993  . INTRAUTERINE DEVICE (IUD) INSERTION     10-31-13 mirena  iud inserted  . 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   . OVARIAN CYST REMOVAL  2012  . VENTRAL HERNIA REPAIR  09/29/2011   Procedure: HERNIA REPAIR VENTRAL ADULT;  Surgeon: Joyice Faster. Cornett, MD;  Location: [facility];  Service: General;  Laterality: N/A;    Family History  Problem Relation Age of Onset  . Diabetes Mother   . Hypertension Mother   . Hypertension Father     Social History   Tobacco Use  . Smoking status: Former Smoker    Types: Cigars    Quit date: 09/21/2006    Years since quitting: 12.9  . Smokeless tobacco: Never Used  Vaping Use  . Vaping Use: Never used  Substance Use Topics  . Alcohol use: Not Currently    Alcohol/week: 0.0 standard drinks    Comment: occas  . Drug use: No    Allergies:  Allergies  Allergen Reactions  . Other Itching    Latex condoms  . Percocet [Oxycodone-Acetaminophen] Itching  .  Valtrex [Valacyclovir Hcl]     Headache and nausea.    Medications Prior to Admission  Medication Sig Dispense Refill Last Dose  . acetaminophen (TYLENOL) 500 MG tablet Take 1,000 mg by mouth every 6 (six) hours as needed. For pain     . aspirin-acetaminophen-caffeine (EXCEDRIN MIGRAINE) 250-250-65 MG per tablet Take 1 tablet by mouth daily as needed. For migraines     . buPROPion (WELLBUTRIN SR) 100 MG 12 hr tablet Take 100 mg by mouth every morning.     . clotrimazole (LOTRIMIN) 1 % cream Apply 1 application topically 2 (two) times daily. 30 g 0   . ibuprofen (ADVIL) 800 MG tablet Take 1 tablet (800 mg total) by mouth 3 (three) times daily. 21 tablet 0   . metroNIDAZOLE (FLAGYL) 500 MG tablet Take 1 tablet (500 mg total) by mouth 2 (two) times daily. 14 tablet 0   . omeprazole (PRILOSEC) 40 MG capsule Take 1 capsule by mouth as needed.  0   . propranolol ER (INDERAL LA) 60 MG 24 hr capsule Take 60 mg by mouth daily.       Review of Systems  Constitutional: Negative.  Negative for fatigue and fever.  HENT: Negative.   Respiratory: Negative.   Negative for shortness of breath.   Cardiovascular: Negative.  Negative for chest pain.  Gastrointestinal: Positive for abdominal pain, nausea and vomiting. Negative for constipation and diarrhea.  Genitourinary: Negative.  Negative for dysuria, vaginal bleeding and vaginal discharge.  Neurological: Positive for headaches. Negative for dizziness.   Physical Exam   Blood pressure (!) 140/92, pulse 76, temperature 98.2 F (36.8 C), temperature source Oral, resp. rate 16, weight 79.9 kg, last menstrual period 07/07/2019, SpO2 100 %.  Patient Vitals for the past 24 hrs:  BP Temp Temp src Pulse Resp SpO2 Weight  09/09/19 1631 126/83 -- -- 82 -- -- --  09/09/19 1616 126/84 -- -- 81 -- -- --  09/09/19 1601 116/80 -- -- 82 -- -- --  09/09/19 1546 (!) 138/76 -- -- 78 -- -- --  09/09/19 1539 (!) 131/90 -- -- 77 -- -- --  09/09/19 1413 (!) 140/92 98.2 F (36.8 C) Oral 76 16 100 % 79.9 kg     Physical Exam Vitals and nursing note reviewed.  Constitutional:      General: She is not in acute distress.    Appearance: She is well-developed.  HENT:     Head: Normocephalic.  Eyes:     Pupils: Pupils are equal, round, and reactive to light.  Cardiovascular:     Rate and Rhythm: Normal rate and regular rhythm.     Heart sounds: Normal heart sounds.  Pulmonary:     Effort: Pulmonary effort is normal. No respiratory distress.     Breath sounds: Normal breath sounds.  Abdominal:     General: Bowel sounds are normal. There is no distension.     Palpations: Abdomen is soft.     Tenderness: There is no abdominal tenderness.  Skin:    General: Skin is warm and dry.  Neurological:     Mental Status: She is alert and oriented to person, place, and time.  Psychiatric:        Behavior: Behavior normal.        Thought Content: Thought content normal.        Judgment: Judgment normal.     MAU Course  Procedures Results for orders placed or performed during the hospital encounter of 09/09/19  (from  the past 24 hour(s))  Urinalysis, Routine w reflex microscopic     Status: Abnormal   Collection Time: 09/09/19  2:18 PM  Result Value Ref Range   Color, Urine YELLOW YELLOW   APPearance CLOUDY (A) CLEAR   Specific Gravity, Urine 1.025 1.005 - 1.030   pH 5.5 5.0 - 8.0   Glucose, UA NEGATIVE NEGATIVE mg/dL   Hgb urine dipstick TRACE (A) NEGATIVE   Bilirubin Urine NEGATIVE NEGATIVE   Ketones, ur NEGATIVE NEGATIVE mg/dL   Protein, ur NEGATIVE NEGATIVE mg/dL   Nitrite NEGATIVE NEGATIVE   Leukocytes,Ua NEGATIVE NEGATIVE  Urinalysis, Microscopic (reflex)     Status: Abnormal   Collection Time: 09/09/19  2:18 PM  Result Value Ref Range   RBC / HPF 0-5 0 - 5 RBC/hpf   WBC, UA 0-5 0 - 5 WBC/hpf   Bacteria, UA RARE (A) NONE SEEN   Squamous Epithelial / LPF 6-10 0 - 5  Wet prep, genital     Status: Abnormal   Collection Time: 09/09/19  3:41 PM  Result Value Ref Range   Yeast Wet Prep HPF POC NONE SEEN NONE SEEN   Trich, Wet Prep NONE SEEN NONE SEEN   Clue Cells Wet Prep HPF POC PRESENT (A) NONE SEEN   WBC, Wet Prep HPF POC FEW (A) NONE SEEN   Sperm NONE SEEN   CBC     Status: None   Collection Time: 09/09/19  4:34 PM  Result Value Ref Range   WBC 6.3 4.0 - 10.5 K/uL   RBC 4.74 3.87 - 5.11 MIL/uL   Hemoglobin 13.7 12.0 - 15.0 g/dL   HCT 40.5 36 - 46 %   MCV 85.4 80.0 - 100.0 fL   MCH 28.9 26.0 - 34.0 pg   MCHC 33.8 30.0 - 36.0 g/dL   RDW 12.8 11.5 - 15.5 %   Platelets 370 150 - 400 K/uL   nRBC 0.0 0.0 - 0.2 %  hCG, quantitative, pregnancy     Status: Abnormal   Collection Time: 09/09/19  4:34 PM  Result Value Ref Range   hCG, Beta Chain, Quant, S 140,129 (H) <5 mIU/mL   US OB LESS THAN 14 WEEKS WITH OB TRANSVAGINAL  Result Date: 09/09/2019 CLINICAL DATA:  Abdominal pain, pregnant EXAM: OBSTETRIC <14 WK [place] AND TRANSVAGINAL OB US TECHNIQUE: Both transabdominal and transvaginal ultrasound examinations were performed for complete evaluation of the gestation as well as the  maternal uterus, adnexal regions, and pelvic cul-de-sac. Transvaginal technique was performed to assess early pregnancy. COMPARISON:  None. FINDINGS: Intrauterine gestational sac: Present, single Yolk sac:  Present, normal appearing, single Embryo:  Present, single Cardiac Activity: Present Heart Rate: 161 bpm MSD: Appropriate given fetal size CRL:  32.8 mm   10 w   1 d                  [place] EDC: 04/05/2020 Subchorionic hemorrhage: A large subchorionic hemorrhage is identified spanning more than 50% of the chorionic surface. Maternal uterus/adnexae: The uterus is retroverted, but is otherwise unremarkable. No intrauterine masses are seen. The cervix is closed and is unremarkable. The maternal ovaries are unremarkable. No free fluid is seen within the pelvis. IMPRESSION: Single living intrauterine gestation with an estimated gestational age of [redacted] weeks, 1 day. Large subchorionic hemorrhage. Electronically Signed   By: Fidela Salisbury MD   On: 09/09/2019 17:47    MDM UA, UPT CBC, HCG ABO/Rh- O Pos Wet prep and gc/chlamydia US OB Comp Less  14 weeks with Transvaginal  LR bolus Zofran IV  Cycled BPs in MAU- initially elevated but became normotensive by discharge.   Assessment and Plan   1. Normal intrauterine pregnancy on prenatal ultrasound in first trimester   2. Abdominal pain affecting pregnancy   3. [redacted] weeks gestation of pregnancy   4. Hypertension affecting pregnancy in first trimester   5. Pregnancy related nausea, antepartum   6. Subchorionic hematoma in first trimester, single or unspecified fetus    -Discharge home in stable condition -Rx for metrogel and zofran sent to patient's pharmacy -First trimester and subchorionic hemorrhage precautions discussed -Patient advised to follow-up with OB as scheduled for prenatal care on Thursday -Patient may return to MAU as needed or if her condition were to change or worsen   Wende Mott CNM 09/09/2019, 3:04 PM

## 2019-09-09 NOTE — MAU Note (Signed)
UA sent; not enough for culture collection.

## 2019-09-09 NOTE — Discharge Instructions (Signed)
Safe Medications in Pregnancy  ° °Acne: °Benzoyl Peroxide °Salicylic Acid ° °Backache/Headache: °Tylenol: 2 regular strength every 4 hours OR °             2 Extra strength every 6 hours ° °Colds/Coughs/Allergies: °Benadryl (alcohol free) 25 mg every 6 hours as needed °Breath right strips °Claritin °Cepacol throat lozenges °Chloraseptic throat spray °Cold-Eeze- up to three times per day °Cough drops, alcohol free °Flonase (by prescription only) °Guaifenesin °Mucinex °Robitussin DM (plain only, alcohol free) °Saline nasal spray/drops °Sudafed (pseudoephedrine) & Actifed ** use only after [redacted] weeks gestation and if you do not have high blood pressure °Tylenol °Vicks Vaporub °Zinc lozenges °Zyrtec  ° °Constipation: °Colace °Ducolax suppositories °Fleet enema °Glycerin suppositories °Metamucil °Milk of magnesia °Miralax °Senokot °Smooth move tea ° °Diarrhea: °Kaopectate °Imodium A-D ° °*NO pepto Bismol ° °Hemorrhoids: °Anusol °Anusol HC °Preparation H °Tucks ° °Indigestion: °Tums °Maalox °Mylanta °Zantac  °Pepcid ° °Insomnia: °Benadryl (alcohol free) 25mg every 6 hours as needed °Tylenol PM °Unisom, no Gelcaps ° °Leg Cramps: °Tums °MagGel ° °Nausea/Vomiting:  °Bonine °Dramamine °Emetrol °Ginger extract °Sea bands °Meclizine  °Nausea medication to take during pregnancy:  °Unisom (doxylamine succinate 25 mg tablets) Take one tablet daily at bedtime. If symptoms are not adequately controlled, the dose can be increased to a maximum recommended dose of two tablets daily (1/2 tablet in the morning, 1/2 tablet mid-afternoon and one at bedtime). °Vitamin B6 100mg tablets. Take one tablet twice a day (up to 200 mg per day). ° °Skin Rashes: °Aveeno products °Benadryl cream or 25mg every 6 hours as needed °Calamine Lotion °1% cortisone cream ° °Yeast infection: °Gyne-lotrimin 7 °Monistat 7 ° ° °**If taking multiple medications, please check labels to avoid duplicating the same active ingredients °**take medication as directed on  the label °** Do not exceed 4000 mg of tylenol in 24 hours °**Do not take medications that contain aspirin or ibuprofen ° ° ° ° °Subchorionic Hematoma ° °A subchorionic hematoma is a gathering of blood between the outer wall of the embryo (chorion) and the inner wall of the womb (uterus). °This condition can cause vaginal bleeding. If they cause little or no vaginal bleeding, early small hematomas usually shrink on their own and do not affect your baby or pregnancy. When bleeding starts later in pregnancy, or if the hematoma is larger or occurs in older pregnant women, the condition may be more serious. Larger hematomas may get bigger, which increases the chances of miscarriage. This condition also increases the risk of: °· Premature separation of the placenta from the uterus. °· Premature (preterm) labor. °· Stillbirth. °What are the causes? °The exact cause of this condition is not known. It occurs when blood is trapped between the placenta and the uterine wall because the placenta has separated from the original site of implantation. °What increases the risk? °You are more likely to develop this condition if: °· You were treated with fertility medicines. °· You conceived through in vitro fertilization (IVF). °What are the signs or symptoms? °Symptoms of this condition include: °· Vaginal spotting or bleeding. °· Contractions of the uterus. These cause abdominal pain. °Sometimes you may have no symptoms and the bleeding may only be seen when ultrasound images are taken (transvaginal ultrasound). °How is this diagnosed? °This condition is diagnosed based on a physical exam. This includes a pelvic exam. You may also have other tests, including: °· Blood tests. °· Urine tests. °· Ultrasound of the abdomen. °How is this treated? °Treatment for this condition   can vary. Treatment may include: °· Watchful waiting. You will be monitored closely for any changes in bleeding. During this stage: °? The hematoma may be  reabsorbed by the body. °? The hematoma may separate the fluid-filled space containing the embryo (gestational sac) from the wall of the womb (endometrium). °· Medicines. °· Activity restriction. This may be needed until the bleeding stops. °Follow these instructions at home: °· Stay on bed rest if told to do so by your health care provider. °· Do not lift anything that is heavier than 10 lbs. (4.5 kg) or as told by your health care provider. °· Do not use any products that contain nicotine or tobacco, such as cigarettes and e-cigarettes. If you need help quitting, ask your health care provider. °· Track and write down the number of pads you use each day and how soaked (saturated) they are. °· Do not use tampons. °· Keep all follow-up visits as told by your health care provider. This is important. Your health care provider may ask you to have follow-up blood tests or ultrasound tests or both. °Contact a health care provider if: °· You have any vaginal bleeding. °· You have a fever. °Get help right away if: °· You have severe cramps in your stomach, back, abdomen, or pelvis. °· You pass large clots or tissue. Save any tissue for your health care provider to look at. °· You have more vaginal bleeding, and you faint or become lightheaded or weak. °Summary °· A subchorionic hematoma is a gathering of blood between the outer wall of the placenta and the uterus. °· This condition can cause vaginal bleeding. °· Sometimes you may have no symptoms and the bleeding may only be seen when ultrasound images are taken. °· Treatment may include watchful waiting, medicines, or activity restriction. °This information is not intended to replace advice given to you by your health care provider. Make sure you discuss any questions you have with your health care provider. °Document Revised: 01/14/2017 Document Reviewed: 03/30/2016 °Elsevier Patient Education © 2020 [employer] ° °

## 2019-09-09 NOTE — MAU Note (Addendum)
Toni Warren is a 34 y.o. at 10w here in MAU reporting:  +lower back pain; cramping  +elevated blood pressure ; 143/100 at home; is not on bp medication +headache  +emesis; unable to keep much down; ongoing; on phenergan that she reports she takes when its "really bad" as well as protonix  +diarrhea; since Thursday  Pain score: back pain 6/10; headache 3-4/10  Vitals:   09/09/19 1413  BP: (!) 140/92  Pulse: 76  Resp: 16  Temp: 98.2 F (36.8 C)  SpO2: 100%     Lab orders placed from triage: ua

## 2019-09-10 LAB — GC/CHLAMYDIA PROBE AMP (~~LOC~~) NOT AT ARMC
Chlamydia: NEGATIVE
Comment: NEGATIVE
Comment: NORMAL
Neisseria Gonorrhea: NEGATIVE

## 2020-02-09 ENCOUNTER — Inpatient Hospital Stay (HOSPITAL_COMMUNITY)
Admission: AD | Admit: 2020-02-09 | Discharge: 2020-02-09 | Disposition: A | Discharge status: Home / Self Care | Payer: Medicaid Other | Attending: Obstetrics and Gynecology | Admitting: Obstetrics and Gynecology

## 2020-02-09 ENCOUNTER — Other Ambulatory Visit: Payer: Self-pay

## 2020-02-09 DIAGNOSIS — Z3A10 10 weeks gestation of pregnancy: Secondary | ICD-10-CM

## 2020-02-09 DIAGNOSIS — R109 Unspecified abdominal pain: Secondary | ICD-10-CM | POA: Diagnosis not present

## 2020-02-09 DIAGNOSIS — Z3A31 31 weeks gestation of pregnancy: Secondary | ICD-10-CM | POA: Insufficient documentation

## 2020-02-09 DIAGNOSIS — U071 COVID-19: Secondary | ICD-10-CM | POA: Insufficient documentation

## 2020-02-09 DIAGNOSIS — O161 Unspecified maternal hypertension, first trimester: Secondary | ICD-10-CM

## 2020-02-09 DIAGNOSIS — R110 Nausea: Secondary | ICD-10-CM

## 2020-02-09 DIAGNOSIS — O98513 Other viral diseases complicating pregnancy, third trimester: Secondary | ICD-10-CM | POA: Diagnosis not present

## 2020-02-09 DIAGNOSIS — O468X1 Other antepartum hemorrhage, first trimester: Secondary | ICD-10-CM | POA: Diagnosis not present

## 2020-02-09 DIAGNOSIS — O26891 Other specified pregnancy related conditions, first trimester: Secondary | ICD-10-CM

## 2020-02-09 DIAGNOSIS — O26893 Other specified pregnancy related conditions, third trimester: Secondary | ICD-10-CM | POA: Insufficient documentation

## 2020-02-09 DIAGNOSIS — J069 Acute upper respiratory infection, unspecified: Secondary | ICD-10-CM

## 2020-02-09 LAB — RESP PANEL BY RT-PCR (FLU A&B, COVID) ARPGX2
Influenza A by PCR: NEGATIVE
Influenza B by PCR: NEGATIVE
SARS Coronavirus 2 by RT PCR: POSITIVE — AB

## 2020-02-09 NOTE — MAU Note (Signed)
Toni Warren is a 34 y.o. at [redacted]w[redacted]d here in MAU reporting: had tdap vaccine on Tuesday and ever since then she has been feeling sick. States she has been feeling achy and this AM had some chest pain with coughing. States once she gets up she starts feeling better. Had a fever yesterday 101.9 and today it was normal. Denies labor complaints and reports +FM.  Onset of complaint: ongoing  Pain score: 3/10  Vitals:   02/09/20 1326  BP: 119/77  Pulse: (!) 103  Resp: 16  Temp: 98.5 F (36.9 C)  SpO2: 98%     FHT: 145  Lab orders placed from triage: none

## 2020-02-09 NOTE — Discharge Instructions (Signed)
See your doctor if you get worse or are unable to clear a cough. Your results will come in [place] in 1-2 hours   Safe Medications in Pregnancy   Acne: Benzoyl Peroxide Salicylic Acid  Backache/Headache: Tylenol: 2 regular strength every 4 hours OR              2 Extra strength every 6 hours  Colds/Coughs/Allergies: Benadryl (alcohol free) 25 mg every 6 hours as needed Breath right strips Claritin Cepacol throat lozenges Chloraseptic throat spray Cold-Eeze- up to three times per day Cough drops, alcohol free Flonase Guaifenesin Mucinex Robitussin DM (plain only, alcohol free) Saline nasal spray/drops Sudafed (pseudoephedrine) & Actifed ** use only after [redacted] weeks gestation and if you do not have high blood pressure Tylenol Vicks Vaporub Zinc lozenges Zyrtec   Constipation: Colace Ducolax suppositories Fleet enema Glycerin suppositories Metamucil Milk of magnesia Miralax Senokot Smooth move tea  Diarrhea: Kaopectate Imodium A-D  *NO pepto Bismol  Hemorrhoids: Anusol Anusol HC Preparation H Tucks  Indigestion: Tums Maalox Mylanta Zantac  Pepcid  Insomnia: Benadryl (alcohol free) 25mg  every 6 hours as needed Tylenol PM Unisom, no Gelcaps  Leg Cramps: Tums MagGel  Nausea/Vomiting:  Bonine Dramamine Emetrol Ginger extract Sea bands Meclizine  Nausea medication to take during pregnancy:  Unisom (doxylamine succinate 25 mg tablets) Take one tablet daily at bedtime. If symptoms are not adequately controlled, the dose can be increased to a maximum recommended dose of two tablets daily (1/2 tablet in the morning, 1/2 tablet mid-afternoon and one at bedtime). Vitamin B6 100mg  tablets. Take one tablet twice a day (up to 200 mg per day).  Skin Rashes: Aveeno products Benadryl cream or 25mg  every 6 hours as needed Calamine Lotion 1% cortisone cream  Yeast infection: Gyne-lotrimin 7 Monistat 7   **If taking multiple medications, please  check labels to avoid duplicating the same active ingredients **take medication as directed on the label ** Do not exceed 4000 mg of tylenol in 24 hours **Do not take medications that contain aspirin or ibuprofen

## 2020-02-09 NOTE — MAU Provider Note (Addendum)
Event Date/Time   First Provider Initiated Contact with Patient 02/09/20 1337      S Ms. Toni Warren is a 34 y.o. [phone] patient who presents to MAU today with complaint of cough and fever on yesterday that resolved without medication.  Taking no meds today.  Is wondering if she has pneumonia.   O BP 119/77 (BP Location: Right Arm)   Pulse (!) 103   Temp 98.5 F (36.9 C) (Oral)   Resp 16   Ht 5\' 2"  (1.575 m)   Wt 83.9 kg   LMP 07/07/2019 (Exact Date)   SpO2 98% Comment: room air  BMI 33.84 kg/m  Physical Exam GENERAL: Well-developed, well-nourished female in no acute distress.  HEENT: Normocephalic, atraumatic.  LUNGS: Effort normal  HEART: Regular rate  SKIN: Warm, dry and without erythema  PSYCH: Normal mood and affect Client is well appearing without obvious congestion or coughing O2 sat 98%, no chest pain  A Medical screening exam complete Upper respiratory infection - not severe - no further testing other than respiratory panel needed at this time.  P Discharge from MAU in stable condition Patient given the option of transfer to [facility] for further evaluation or seek care in outpatient facility of choice  Warning signs for worsening condition that would warrant emergency follow-up discussed Will need further medical care if condition worsens or does not resolve. Respiratory panel pending and results expected to come to her MyChart account in 1-2 hours.  Quarantine until test results available. Patient may return to MAU as needed   [name], NP 02/09/2020 1:46 PM    Results for orders placed or performed during the hospital encounter of 02/09/20 (from the past 24 hour(s))  Resp Panel by RT-PCR (Flu A&B, Covid) Nasopharyngeal Swab     Status: Abnormal   Collection Time: 02/09/20  1:40 PM   Specimen: Nasopharyngeal Swab; Nasopharyngeal(NP) swabs in vial transport medium  Result Value Ref Range   SARS Coronavirus 2 by RT PCR POSITIVE (A) NEGATIVE    Influenza A by PCR NEGATIVE NEGATIVE   Influenza B by PCR NEGATIVE NEGATIVE   14:55  Client called and 2 identifiers used to confirm identity.  Relayed that covid test was positive.  Client was surprised.  Reassured by O2 Sat done today that was normal. She has not had the Covid vaccine.  -Advised to seek care if symptoms are worsening.   Called client again and verified that symtoms began on 02-08-20.  Reviewed the possibility of  mAB infusion option for her since she is pregnant.  Client had heard of this treatment and was interested.  Secure chat sent to Mab clinic with client's info.  Explained to client that she would likely receive a call on Monday to be scheduled for the Mab infusion.  Earlie Server, RN, MSN, NP-BC Nurse Practitioner, [facility] for [employer], [facility] 02/09/2020 3:18 PM

## 2020-02-10 ENCOUNTER — Telehealth: Payer: Self-pay | Admitting: Nurse Practitioner

## 2020-02-10 ENCOUNTER — Other Ambulatory Visit: Payer: Self-pay | Admitting: Physician Assistant

## 2020-02-10 DIAGNOSIS — Z3A32 32 weeks gestation of pregnancy: Secondary | ICD-10-CM

## 2020-02-10 DIAGNOSIS — U071 COVID-19: Secondary | ICD-10-CM

## 2020-02-10 NOTE — Progress Notes (Signed)
I connected by phone with Toni Warren on 02/10/2020 at 5:16 PM to discuss the potential use of a new treatment for mild to moderate COVID-19 viral infection in non-hospitalized patients.  This patient is a 34 y.o. female that meets the FDA criteria for Emergency Use Authorization of COVID monoclonal antibody sotrovimab, casirivimab/imdevimab or bamlamivimab/estevimab.  Has a (+) direct SARS-CoV-2 viral test result  Has mild or moderate COVID-19   Is NOT hospitalized due to COVID-19  Is within 10 days of symptom onset  Has at least one of the high risk factor(s) for progression to severe COVID-19 and/or hospitalization as defined in EUA.  Specific high risk criteria : Pregnancy and Other high risk medical condition per CDC:  high SVI   I have spoken and communicated the following to the patient or parent/caregiver regarding COVID monoclonal antibody treatment:  1. FDA has authorized the emergency use for the treatment of mild to moderate COVID-19 in adults and pediatric patients with positive results of direct SARS-CoV-2 viral testing who are 94 years of age and older weighing at least 40 kg, and who are at high risk for progressing to severe COVID-19 and/or hospitalization.  2. The significant known and potential risks and benefits of COVID monoclonal antibody, and the extent to which such potential risks and benefits are unknown.  3. Information on available alternative treatments and the risks and benefits of those alternatives, including clinical trials.  4. Patients treated with COVID monoclonal antibody should continue to self-isolate and use infection control measures (e.g., wear mask, isolate, social distance, avoid sharing personal items, clean and disinfect "high touch" surfaces, and frequent handwashing) according to CDC guidelines.   5. The patient or parent/caregiver has the option to accept or refuse COVID monoclonal antibody treatment.  After reviewing this information  with the patient, the patient has agreed to receive one of the available covid 19 monoclonal antibodies and will be provided an appropriate fact sheet prior to infusion.  Sx onset 12/23. Set up for infusion on 12/28 @ 10:30am. Directions given to [facility]. Pt is aware that insurance will be charged an infusion fee. Pt is unvaccinated. + in epic.   Angelena Form 02/10/2020 5:16 PM

## 2020-02-10 NOTE — Telephone Encounter (Signed)
Called to Discuss with patient about Covid symptoms and the use of the monoclonal antibody infusion for those with mild to moderate Covid symptoms and at a high risk of hospitalization.     Pt appears to qualify for this infusion due to co-morbid conditions and/or a member of an at-risk group in accordance with the FDA Emergency Use Authorization.    Unable to reach pt - left message for patient to return call  

## 2020-02-12 ENCOUNTER — Ambulatory Visit (HOSPITAL_COMMUNITY): Admission: RE | Admit: 2020-02-12 | Payer: Medicaid Other | Source: Ambulatory Visit

## 2020-02-16 NOTE — L&D Delivery Note (Addendum)
Delivery Note At 11:26 AM a viable female was delivered via Vaginal, Spontaneous (Presentation:   Occiput anterior    ).  APGAR: 9, 9; weight  .   Placenta status: Manual removal; Verbal informed consent obtained for manual removal of the placenta. Dr. Royce Macadamia administered IV nigroglycerin. The placent was delivered manually and appeared intact...  Cord: 3 vessels with the following complications: None.  Cord pH: NA  Anesthesia: Epidural Episiotomy: None Lacerations:  First degree perineal  Suture Repair: 3.0 vicryl Est. Blood Loss (mL):  100 ML  Mom to postpartum.  Baby to Couplet care / Skin to Skin.  Toni Warren 03/20/2020, 12:21 PM

## 2020-02-28 ENCOUNTER — Inpatient Hospital Stay (HOSPITAL_COMMUNITY)
Admission: AD | Admit: 2020-02-28 | Discharge: 2020-02-28 | Disposition: A | Discharge status: Home / Self Care | Payer: BC Managed Care – PPO | Attending: Obstetrics & Gynecology | Admitting: Obstetrics & Gynecology

## 2020-02-28 ENCOUNTER — Other Ambulatory Visit: Payer: Self-pay

## 2020-02-28 ENCOUNTER — Encounter (HOSPITAL_COMMUNITY): Payer: Self-pay | Admitting: Obstetrics & Gynecology

## 2020-02-28 DIAGNOSIS — Z79899 Other long term (current) drug therapy: Secondary | ICD-10-CM | POA: Insufficient documentation

## 2020-02-28 DIAGNOSIS — O368130 Decreased fetal movements, third trimester, not applicable or unspecified: Secondary | ICD-10-CM

## 2020-02-28 DIAGNOSIS — Z3A34 34 weeks gestation of pregnancy: Secondary | ICD-10-CM

## 2020-02-28 DIAGNOSIS — Z0371 Encounter for suspected problem with amniotic cavity and membrane ruled out: Secondary | ICD-10-CM

## 2020-02-28 DIAGNOSIS — Z87891 Personal history of nicotine dependence: Secondary | ICD-10-CM | POA: Diagnosis not present

## 2020-02-28 HISTORY — DX: Gestational (pregnancy-induced) hypertension without significant proteinuria, unspecified trimester: O13.9

## 2020-02-28 LAB — URINALYSIS, ROUTINE W REFLEX MICROSCOPIC
Bilirubin Urine: NEGATIVE
Glucose, UA: NEGATIVE mg/dL
Hgb urine dipstick: NEGATIVE
Ketones, ur: NEGATIVE mg/dL
Leukocytes,Ua: NEGATIVE
Nitrite: NEGATIVE
Protein, ur: NEGATIVE mg/dL
Specific Gravity, Urine: 1.013 (ref 1.005–1.030)
pH: 5 (ref 5.0–8.0)

## 2020-02-28 LAB — WET PREP, GENITAL
Clue Cells Wet Prep HPF POC: NONE SEEN
Sperm: NONE SEEN
Trich, Wet Prep: NONE SEEN
Yeast Wet Prep HPF POC: NONE SEEN

## 2020-02-28 LAB — POCT FERN TEST: POCT Fern Test: NEGATIVE

## 2020-02-28 LAB — OB RESULTS CONSOLE GC/CHLAMYDIA: Gonorrhea: NEGATIVE

## 2020-02-28 NOTE — Progress Notes (Signed)
Patient reports 16 fetal kicks from 2130-2200. MD notified. Patient to be discharged

## 2020-02-28 NOTE — Progress Notes (Signed)
Patient reports 8 fetal kick counts from 2036 - 2130 will continue to assess

## 2020-02-28 NOTE — MAU Provider Note (Signed)
History     [id number]  Arrival date and time: 02/28/20 1952    Chief Complaint  Patient presents with  . Decreased Fetal Movement  . Rupture of Membranes  . Headache     HPI Toni Warren is a 35 y.o. at [redacted]w[redacted]d who presents for loss of fluid and decreased fetal movement.   Patient reports she feels like she has been leaking fluid for the past 2 weeks.  No large gush at any point, no odor or color to the fluid.  Denies vaginal bleeding.  Denies contractions.  Has also had decreased fetal movement over the past few days.     OB History    Gravida  4   Para  2   Term  2   Preterm  0   AB  1   Living  2     SAB  0   IAB  1   Ectopic  0   Multiple  0   Live Births  2           Past Medical History:  Diagnosis Date  . Abdominal hernia   . Abnormal Pap smear of cervix 2005   HPV +, 05-26-2018 LGSIL HPV HR+  . Anemia   . Anxiety   . Chlamydia 08/18/2015   repeat testing negative October 2017.  Marland Kitchen Condyloma acuminatum of vulva   . Depression    but doesn't require meds  . GERD (gastroesophageal reflux disease)    only takes OTC meds  . History of bronchitis 3+yrs ago  . History of migraine 09/20/11-last one  . HSV-1 (herpes simplex virus 1) infection   . HSV-2 (herpes simplex virus 2) infection   . Hyperlipidemia    postpartum 2+yrs ago  . Low vitamin D level 2018  . Migraine    with aura  . Pregnancy induced hypertension    previous pregnancy  . Sickle cell trait ([place])   . Sleeping difficulty   . Trichomonas infection 09/2016    Past Surgical History:  Procedure Laterality Date  . ABDOMINOPLASTY  09/29/2011   Procedure: ABDOMINOPLASTY;  Surgeon: Theodoro Kos, DO;  Location: [facility];  Service: Plastics;  Laterality: N/A;  ABDOMINOPLASTY FOR REPAIR OF RECTUS DIASTASIS  . EYE SURGERY  1993  . INTRAUTERINE DEVICE (IUD) INSERTION     10-31-13 mirena iud inserted  . 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   . OVARIAN CYST REMOVAL  2012  .  VENTRAL HERNIA REPAIR  09/29/2011   Procedure: HERNIA REPAIR VENTRAL ADULT;  Surgeon: Joyice Faster. Cornett, MD;  Location: [facility];  Service: General;  Laterality: N/A;    Family History  Problem Relation Age of Onset  . Diabetes Mother   . Hypertension Mother   . Hypertension Father     Social History   Socioeconomic History  . Marital status: Single    Spouse name: Not on file  . Number of children: 2  . Years of education: Not on file  . Highest education level: Not on file  Occupational History  . Not on file  Tobacco Use  . Smoking status: Former Smoker    Types: Cigars    Quit date: 09/21/2006    Years since quitting: 13.4  . Smokeless tobacco: Never Used  Vaping Use  . Vaping Use: Never used  Substance and Sexual Activity  . Alcohol use: Not Currently    Alcohol/week: 0.0 standard drinks    Comment: occas  . Drug  use: No  . Sexual activity: Yes    Partners: Male    Birth control/protection: Pill    Comment: Micronor  Other Topics Concern  . Not on file  Social History Narrative  . Not on file   Social Determinants of Health   Financial Resource Strain: Not on file  Food Insecurity: Not on file  Transportation Needs: Not on file  Physical Activity: Not on file  Stress: Not on file  Social Connections: Not on file  Intimate Partner Violence: Not on file    Allergies  Allergen Reactions  . Other Itching    Latex condoms  . Percocet [Oxycodone-Acetaminophen] Itching  . Valtrex [Valacyclovir Hcl]     Headache and nausea.    No current facility-administered medications on file prior to encounter.   Current Outpatient Medications on File Prior to Encounter  Medication Sig Dispense Refill  . acetaminophen (TYLENOL) 500 MG tablet Take 1,000 mg by mouth every 6 (six) hours as needed. For pain    . ondansetron (ZOFRAN ODT) 4 MG disintegrating tablet Take 1 tablet (4 mg total) by mouth every 8 (eight) hours as needed for nausea or vomiting. 30 tablet 1  .  pantoprazole (PROTONIX) 40 MG tablet Take 40 mg by mouth daily.    Marland Kitchen buPROPion (WELLBUTRIN SR) 100 MG 12 hr tablet Take 100 mg by mouth every morning.    Marland Kitchen omeprazole (PRILOSEC) 40 MG capsule Take 1 capsule by mouth as needed.  0  . [DISCONTINUED] norethindrone (MICRONOR) 0.35 MG tablet Take 1 tablet (0.35 mg total) by mouth daily. 3 Package 0     ROS Pertinent positives and negative per HPI, all others reviewed and negative  Physical Exam   BP 128/77 (BP Location: Right Arm)   Pulse 89   Temp 98.8 F (37.1 C) (Oral)   Resp 16   Ht 5\' 2"  (1.575 m)   Wt 85.3 kg   LMP 07/07/2019 (Exact Date)   SpO2 98%   BMI 34.39 kg/m   Physical Exam Vitals reviewed.  Constitutional:      General: She is not in acute distress.    Appearance: She is well-developed and well-nourished. She is not diaphoretic.  Eyes:     General: No scleral icterus. Pulmonary:     Effort: Pulmonary effort is normal. No respiratory distress.  Abdominal:     General: There is no distension.     Palpations: Abdomen is soft.     Tenderness: There is no abdominal tenderness. There is no guarding or rebound.  Genitourinary:    Comments: Sterile speculum exam with negative pool, negative Valsalva, cervix appears closed visually Musculoskeletal:        General: No edema.  Skin:    General: Skin is warm and dry.  Neurological:     Mental Status: She is alert.     Coordination: Coordination normal.  Psychiatric:        Mood and Affect: Mood and affect normal.      Cervical Exam    Bedside Ultrasound Not done  My interpretation: n/a  FHT Baseline 150, moderate variability, +accels, no decels Toco: no contractions Cat: I  Labs Results for orders placed or performed during the hospital encounter of 02/28/20 (from the past 24 hour(s))  Urinalysis, Routine w reflex microscopic Urine, Clean Catch     Status: None   Collection Time: 02/28/20  8:23 PM  Result Value Ref Range   Color, Urine YELLOW YELLOW    APPearance CLEAR CLEAR  Specific Gravity, Urine 1.013 1.005 - 1.030   pH 5.0 5.0 - 8.0   Glucose, UA NEGATIVE NEGATIVE mg/dL   Hgb urine dipstick NEGATIVE NEGATIVE   Bilirubin Urine NEGATIVE NEGATIVE   Ketones, ur NEGATIVE NEGATIVE mg/dL   Protein, ur NEGATIVE NEGATIVE mg/dL   Nitrite NEGATIVE NEGATIVE   Leukocytes,Ua NEGATIVE NEGATIVE  Fern Test     Status: Normal   Collection Time: 02/28/20  9:00 PM  Result Value Ref Range   POCT Fern Test Negative = intact amniotic membranes   Wet prep, genital     Status: Abnormal   Collection Time: 02/28/20  9:30 PM   Specimen: PATH Cytology Cervicovaginal Ancillary Only  Result Value Ref Range   Yeast Wet Prep HPF POC NONE SEEN NONE SEEN   Trich, Wet Prep NONE SEEN NONE SEEN   Clue Cells Wet Prep HPF POC NONE SEEN NONE SEEN   WBC, Wet Prep HPF POC FEW (A) NONE SEEN   Sperm NONE SEEN     Imaging No results found.  MAU Course  Procedures  Lab Orders     Wet prep, genital     Urinalysis, Routine w reflex microscopic Urine, Clean Catch     Fern Test No orders of the defined types were placed in this encounter.  Imaging Orders  No imaging studies ordered today    MDM moderate  Assessment and Plan  #Evaluation for rupture membranes, rupture membranes not found Sterile speculum exam negative pooling, negative [place], and swab sample was negative for ferning.  Wet prep negative for infection, GC chlamydia still pending.  Ruled out for rupture.  #Decreased fetal movement Initially reporting DFM, given cold sugary drink and clicker.  Subsequently had numerous fetal movements and felt that baby was moving as normally.  NST reactive as well.  #FWB FHT Cat I NST: Reactive  Clarnce Flock

## 2020-02-28 NOTE — MAU Note (Signed)
Pt reports ?leaking fluid for 2 weeks, has not been evaluated for it. Pressure in lower abd since yesterday. Decreased fetal movement since yesterday

## 2020-02-28 NOTE — Discharge Instructions (Signed)

## 2020-02-29 LAB — GC/CHLAMYDIA PROBE AMP (~~LOC~~) NOT AT ARMC
Chlamydia: NEGATIVE
Comment: NEGATIVE
Comment: NORMAL
Neisseria Gonorrhea: NEGATIVE

## 2020-03-06 LAB — OB RESULTS CONSOLE GBS: GBS: NEGATIVE

## 2020-03-16 ENCOUNTER — Inpatient Hospital Stay (EMERGENCY_DEPARTMENT_HOSPITAL)
Admission: AD | Admit: 2020-03-16 | Discharge: 2020-03-16 | Disposition: A | Discharge status: Home / Self Care | Payer: BC Managed Care – PPO | Source: Home / Self Care | Attending: Obstetrics & Gynecology | Admitting: Obstetrics & Gynecology

## 2020-03-16 ENCOUNTER — Encounter (HOSPITAL_COMMUNITY): Payer: Self-pay | Admitting: Obstetrics & Gynecology

## 2020-03-16 ENCOUNTER — Other Ambulatory Visit: Payer: Self-pay

## 2020-03-16 DIAGNOSIS — O10913 Unspecified pre-existing hypertension complicating pregnancy, third trimester: Secondary | ICD-10-CM | POA: Insufficient documentation

## 2020-03-16 DIAGNOSIS — O163 Unspecified maternal hypertension, third trimester: Secondary | ICD-10-CM

## 2020-03-16 DIAGNOSIS — Z885 Allergy status to narcotic agent status: Secondary | ICD-10-CM | POA: Insufficient documentation

## 2020-03-16 DIAGNOSIS — O99353 Diseases of the nervous system complicating pregnancy, third trimester: Secondary | ICD-10-CM

## 2020-03-16 DIAGNOSIS — Z3A37 37 weeks gestation of pregnancy: Secondary | ICD-10-CM

## 2020-03-16 DIAGNOSIS — Z8249 Family history of ischemic heart disease and other diseases of the circulatory system: Secondary | ICD-10-CM | POA: Insufficient documentation

## 2020-03-16 DIAGNOSIS — Z87891 Personal history of nicotine dependence: Secondary | ICD-10-CM | POA: Insufficient documentation

## 2020-03-16 DIAGNOSIS — G44209 Tension-type headache, unspecified, not intractable: Secondary | ICD-10-CM

## 2020-03-16 DIAGNOSIS — Z3689 Encounter for other specified antenatal screening: Secondary | ICD-10-CM

## 2020-03-16 LAB — COMPREHENSIVE METABOLIC PANEL WITH GFR
ALT: 13 U/L (ref 0–44)
AST: 18 U/L (ref 15–41)
Albumin: 2.5 g/dL — ABNORMAL LOW (ref 3.5–5.0)
Alkaline Phosphatase: 149 U/L — ABNORMAL HIGH (ref 38–126)
Anion gap: 10 (ref 5–15)
BUN: 10 mg/dL (ref 6–20)
CO2: 21 mmol/L — ABNORMAL LOW (ref 22–32)
Calcium: 9.1 mg/dL (ref 8.9–10.3)
Chloride: 104 mmol/L (ref 98–111)
Creatinine, Ser: 0.73 mg/dL (ref 0.44–1.00)
GFR, Estimated: >60 mL/min
Glucose, Bld: 121 mg/dL — ABNORMAL HIGH (ref 70–99)
Potassium: 3.8 mmol/L (ref 3.5–5.1)
Sodium: 135 mmol/L (ref 135–145)
Total Bilirubin: 0.4 mg/dL (ref 0.3–1.2)
Total Protein: 5.9 g/dL — ABNORMAL LOW (ref 6.5–8.1)

## 2020-03-16 LAB — CBC
HCT: 32.5 % — ABNORMAL LOW (ref 36.0–46.0)
Hemoglobin: 10.2 g/dL — ABNORMAL LOW (ref 12.0–15.0)
MCH: 24.4 pg — ABNORMAL LOW (ref 26.0–34.0)
MCHC: 31.4 g/dL (ref 30.0–36.0)
MCV: 77.8 fL — ABNORMAL LOW (ref 80.0–100.0)
Platelets: 387 10*3/uL (ref 150–400)
RBC: 4.18 MIL/uL (ref 3.87–5.11)
RDW: 15.9 % — ABNORMAL HIGH (ref 11.5–15.5)
WBC: 7.7 10*3/uL (ref 4.0–10.5)
nRBC: 0 % (ref 0.0–0.2)

## 2020-03-16 LAB — PROTEIN / CREATININE RATIO, URINE
Creatinine, Urine: 78.54 mg/dL
Protein Creatinine Ratio: 0.1 mg/mg{Cre} (ref 0.00–0.15)
Total Protein, Urine: 8 mg/dL

## 2020-03-16 MED ORDER — HYDROCODONE-ACETAMINOPHEN 5-325 MG PO TABS
1.0000 | ORAL_TABLET | Freq: Once | ORAL | Status: AC
  Administered 2020-03-16: 1 via ORAL
  Filled 2020-03-16: qty 1

## 2020-03-16 MED ORDER — OXYCODONE HCL 5 MG PO TABS: 5.0000 mg | ORAL_TABLET | Freq: Once | ORAL | Status: DC

## 2020-03-16 MED ORDER — PANTOPRAZOLE SODIUM 40 MG PO TBEC
40.0000 mg | DELAYED_RELEASE_TABLET | Freq: Every day | ORAL | Status: DC
  Administered 2020-03-16: 40 mg via ORAL
  Filled 2020-03-16: qty 1

## 2020-03-16 MED ORDER — CYCLOBENZAPRINE HCL 5 MG PO TABS
10.0000 mg | ORAL_TABLET | Freq: Three times a day (TID) | ORAL | Status: DC | PRN
  Administered 2020-03-16: 10 mg via ORAL
  Filled 2020-03-16: qty 2

## 2020-03-16 NOTE — MAU Note (Signed)
Pt reports at home b/p's 140's/100's tonight. Reports headache and blurred vision tonight.

## 2020-03-16 NOTE — MAU Provider Note (Signed)
History     CSN: [id number]  Arrival date and time: 03/16/20 0036   Event Date/Time   First Provider Initiated Contact with Patient 03/16/20 0116      Chief Complaint  Patient presents with  . Headache  . Hypertension   35 y.o. [id number] @37 .0 wks presenting with HA and elevated BP. Reports HA started around 6pm. Located right frontal and temporal. Rates pain 6/10. She took 2 Tylenol w/o relief. She checked her BP and was 150/100. Denies RUQ pain, CP, and SOB. Reports occ blurry vision. Reports good FM.   OB History    Gravida  4   Para  2   Term  2   Preterm  0   AB  1   Living  2     SAB  0   IAB  1   Ectopic  0   Multiple  0   Live Births  2           Past Medical History:  Diagnosis Date  . Abdominal hernia   . Abnormal Pap smear of cervix 2005   HPV +, 05-26-2018 LGSIL HPV HR+  . Anemia   . Anxiety   . Chlamydia 08/18/2015   repeat testing negative October 2017.  Marland Kitchen Condyloma acuminatum of vulva   . Depression    but doesn't require meds  . GERD (gastroesophageal reflux disease)    only takes OTC meds  . History of bronchitis 3+yrs ago  . History of migraine 09/20/11-last one  . HSV-1 (herpes simplex virus 1) infection   . HSV-2 (herpes simplex virus 2) infection   . Hyperlipidemia    postpartum 2+yrs ago  . Low vitamin D level 2018  . Migraine    with aura  . Pregnancy induced hypertension    previous pregnancy  . Sickle cell trait ([place])   . Sleeping difficulty   . Trichomonas infection 09/2016    Past Surgical History:  Procedure Laterality Date  . ABDOMINOPLASTY  09/29/2011   Procedure: ABDOMINOPLASTY;  Surgeon: Theodoro Kos, DO;  Location: [facility];  Service: Plastics;  Laterality: N/A;  ABDOMINOPLASTY FOR REPAIR OF RECTUS DIASTASIS  . EYE SURGERY  1993  . INTRAUTERINE DEVICE (IUD) INSERTION     10-31-13 mirena iud inserted  . 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   . OVARIAN CYST REMOVAL  2012  . VENTRAL HERNIA REPAIR   09/29/2011   Procedure: HERNIA REPAIR VENTRAL ADULT;  Surgeon: Joyice Faster. Cornett, MD;  Location: [facility];  Service: General;  Laterality: N/A;    Family History  Problem Relation Age of Onset  . Diabetes Mother   . Hypertension Mother   . Hypertension Father     Social History   Tobacco Use  . Smoking status: Former Smoker    Types: Cigars    Quit date: 09/21/2006    Years since quitting: 13.4  . Smokeless tobacco: Never Used  Vaping Use  . Vaping Use: Never used  Substance Use Topics  . Alcohol use: Not Currently    Alcohol/week: 0.0 standard drinks    Comment: occas  . Drug use: No    Allergies:  Allergies  Allergen Reactions  . Other Itching    Latex condoms  . Percocet [Oxycodone-Acetaminophen] Itching  . Valtrex [Valacyclovir Hcl]     Headache and nausea.    No medications prior to admission.   Review of Systems  Eyes: Positive for visual disturbance.  Respiratory: Negative for  shortness of breath.   Cardiovascular: Negative for chest pain.  Genitourinary: Negative for vaginal bleeding.  Neurological: Positive for headaches.   Physical Exam   Blood pressure (!) 127/91, pulse 77, temperature 97.9 F (36.6 C), temperature source Oral, resp. rate 16, height 5\' 2"  (1.575 m), weight 89.8 kg, last menstrual period 07/07/2019, SpO2 98 %. Patient Vitals for the past 24 hrs:  BP Temp Temp src Pulse Resp SpO2 Height Weight  03/16/20 0538 (!) 127/91 97.9 F (36.6 C) Oral 77 16 98 % -- --  03/16/20 0501 (!) 127/91 -- -- 89 -- -- -- --  03/16/20 0431 131/89 -- -- 81 -- -- -- --  03/16/20 0401 130/89 -- -- 73 -- -- -- --  03/16/20 0331 131/86 -- -- 69 -- -- -- --  03/16/20 0301 (!) 143/93 -- -- 70 -- -- -- --  03/16/20 0246 (!) 138/93 -- -- 66 -- -- -- --  03/16/20 0231 130/88 -- -- 75 -- -- -- --  03/16/20 0216 128/90 -- -- 85 -- -- -- --  03/16/20 0201 133/88 -- -- 76 -- -- -- --  03/16/20 0146 133/84 -- -- 80 -- -- -- --  03/16/20 0131 135/89 -- -- 88 -- -- -- --   03/16/20 0116 (!) 128/93 -- -- 89 -- -- -- --  03/16/20 0113 134/89 -- -- 79 -- -- -- --  03/16/20 0052 136/90 98.6 F (37 C) Oral 89 17 100 % 5\' 2"  (1.575 m) 89.8 kg   Physical Exam Vitals and nursing note reviewed.  Constitutional:      General: She is not in acute distress.    Appearance: Normal appearance.  HENT:     Head: Normocephalic and atraumatic.  Cardiovascular:     Rate and Rhythm: Normal rate.  Pulmonary:     Effort: Pulmonary effort is normal. No respiratory distress.  Musculoskeletal:        General: No swelling.     Cervical back: Normal range of motion.  Skin:    General: Skin is warm and dry.  Neurological:     General: No focal deficit present.     Mental Status: She is alert and oriented to person, place, and time.  Psychiatric:        Mood and Affect: Mood normal.        Behavior: Behavior normal.   EFM: 155 bpm, mod variability, + accels, no decels Toco: rare  Results for orders placed or performed during the hospital encounter of 03/16/20 (from the past 24 hour(s))  CBC     Status: Abnormal   Collection Time: 03/16/20  1:29 AM  Result Value Ref Range   WBC 7.7 4.0 - 10.5 K/uL   RBC 4.18 3.87 - 5.11 MIL/uL   Hemoglobin 10.2 (L) 12.0 - 15.0 g/dL   HCT 32.5 (L) 36.0 - 46.0 %   MCV 77.8 (L) 80.0 - 100.0 fL   MCH 24.4 (L) 26.0 - 34.0 pg   MCHC 31.4 30.0 - 36.0 g/dL   RDW 15.9 (H) 11.5 - 15.5 %   Platelets 387 150 - 400 K/uL   nRBC 0.0 0.0 - 0.2 %  Comprehensive metabolic panel     Status: Abnormal   Collection Time: 03/16/20  1:29 AM  Result Value Ref Range   Sodium 135 135 - 145 mmol/L   Potassium 3.8 3.5 - 5.1 mmol/L   Chloride 104 98 - 111 mmol/L   CO2 21 (L) 22 - 32 mmol/L  Glucose, Bld 121 (H) 70 - 99 mg/dL   BUN 10 6 - 20 mg/dL   Creatinine, Ser 0.73 0.44 - 1.00 mg/dL   Calcium 9.1 8.9 - 10.3 mg/dL   Total Protein 5.9 (L) 6.5 - 8.1 g/dL   Albumin 2.5 (L) 3.5 - 5.0 g/dL   AST 18 15 - 41 U/L   ALT 13 0 - 44 U/L   Alkaline  Phosphatase 149 (H) 38 - 126 U/L   Total Bilirubin 0.4 0.3 - 1.2 mg/dL   GFR, Estimated >60 >60 mL/min   Anion gap 10 5 - 15  Protein / creatinine ratio, urine     Status: None   Collection Time: 03/16/20  1:38 AM  Result Value Ref Range   Creatinine, Urine 78.54 mg/dL   Total Protein, Urine 8 mg/dL   Protein Creatinine Ratio 0.10 0.00 - 0.15 mg/mg[Cre]   MAU Course  Procedures Flexeril  MDM Review of [facility] records shows hx of PEC and HELLP in previous pregnancy. This pregnancy complicated by hx LEEP, HSV, and [place].  0230: HA not improved, labs normal, BP mildly elevated. Consult with Dr. Laurence Aly to observe for a few more hours and treat HA. Vicodin ordered.  0520: HA improved. Consult with Dr. Elgie Congo recommends discharge home and BP check in office tomorrow. Discussed plan with pt, stable for discharge home.  Assessment and Plan   1. [redacted] weeks gestation of pregnancy   2. NST (non-stress test) reactive   3. Acute non intractable tension-type headache   4. Hypertension affecting pregnancy in third trimester    Discharge home Follow up at [facility] tomorrow for BP check- message left with [facility], CNM Strict PEC precautions  Allergies as of 03/16/2020      Reactions   Other Itching   Latex condoms   Percocet [oxycodone-acetaminophen] Itching   Valtrex [valacyclovir Hcl]    Headache and nausea.      Medication List    TAKE these medications   acetaminophen 500 MG tablet Commonly known as: TYLENOL Take 1,000 mg by mouth every 6 (six) hours as needed. For pain   buPROPion 100 MG 12 hr tablet Commonly known as: WELLBUTRIN SR Take 100 mg by mouth every morning.   omeprazole 40 MG capsule Commonly known as: PRILOSEC Take 1 capsule by mouth as needed.   ondansetron 4 MG disintegrating tablet Commonly known as: Zofran ODT Take 1 tablet (4 mg total) by mouth every 8 (eight) hours as needed for nausea or vomiting.   pantoprazole 40 MG tablet Commonly known as:  PROTONIX Take 40 mg by mouth daily.      Julianne Handler, CNM 03/16/2020, 7:53 AM

## 2020-03-16 NOTE — Discharge Instructions (Signed)
Hypertension During Pregnancy Hypertension is also called high blood pressure. High blood pressure means that the force of the blood moving in your body is high enough to cause problems for you and your baby. Different types of high blood pressure can happen during pregnancy. The types are:  High blood pressure before you got pregnant. This is called chronic hypertension.  This can continue during your pregnancy. Your doctor will want to keep checking your blood pressure. You may need medicine to control your blood pressure while you are pregnant. You will need follow-up visits after you have your baby.  High blood pressure that goes up during pregnancy when it was normal before. This is called gestational hypertension. It will often get better after you have your baby, but your doctor will need to watch your blood pressure to make sure that it is getting better.  You may develop high blood pressure after giving birth. This is called postpartum hypertension. This often occurs within 48 hours after childbirth but may occur up to 6 weeks after giving birth. Very high blood pressure during pregnancy is an emergency that needs treatment right away. How does this affect me? If you have high blood pressure during pregnancy, you have a higher chance of developing high blood pressure:  As you get older.  If you get pregnant again. In some cases, high blood pressure during pregnancy can cause:  Stroke.  Heart attack.  Damage to the kidneys, lungs, or liver.  Preeclampsia.  HELLP syndrome.  Seizures.  Problems with the placenta. How does this affect my baby? Your baby may:  Be born early.  Not weigh as much as he or she should.  Not handle labor well, leading to a C-section. This condition may also result in a baby's death before birth (stillbirth). What are the risks?  Having high blood pressure during a past pregnancy.  Being overweight.  Being age 35 or older.  Being pregnant  for the first time.  Being pregnant with more than one baby.  Becoming pregnant using fertility methods, such as IVF.  Having other problems, such as diabetes or kidney disease. What can I do to lower my risk?  Keep a healthy weight.  Eat a healthy diet.  Follow what your doctor tells you about treating any medical problems that you had before you got pregnant. It is very important to go to all of your doctor visits. Your doctor will check your blood pressure and make sure that your pregnancy is progressing as it should. Treatment should start early if a problem is found.   How is this treated? Treatment for high blood pressure during pregnancy can vary. It depends on the type of high blood pressure you have and how serious it is.  If you were taking medicine for your blood pressure before you got pregnant, talk with your doctor. You may need to change the medicine during pregnancy if it is not safe for your baby.  If your blood pressure goes up during pregnancy, your doctor may order medicine to treat this.  If you are at risk for preeclampsia, your doctor may tell you to take a low-dose aspirin while you are pregnant.  If you have very high blood pressure, you may need to stay in the hospital so you and your baby can be watched closely. You may also need to take medicine to lower your blood pressure.  In some cases, if your condition gets worse, you may need to have your baby early.   Follow these instructions at home: Eating and drinking  Drink enough fluid to keep your pee (urine) pale yellow.  Avoid caffeine.   Lifestyle  Do not smoke or use any products that contain nicotine or tobacco. If you need help quitting, ask your doctor.  Do not use alcohol or drugs.  Avoid stress.  Rest and get plenty of sleep.  Regular exercise can help. Ask your doctor what kinds of exercise are best for you. General instructions  Take over-the-counter and prescription medicines only as  told by your doctor.  Keep all prenatal and follow-up visits. Contact a doctor if:  You have symptoms that your doctor told you to watch for, such as: ? Headaches. ? A feeling like you may vomit (nausea). ? Vomiting. ? Belly (abdominal) pain. ? Feeling dizzy or light-headed. Get help right away if:  You have symptoms of serious problems, such as: ? Very bad belly pain that does not get better with treatment. ? A very bad headache that does not get better. ? Blurry vision. ? Double vision. ? Vomiting that does not get better. ? Sudden, fast weight gain. ? Sudden swelling in your hands, ankles, or face. ? Bleeding from your vagina. ? Blood in your pee. ? Shortness of breath. ? Chest pain. ? Weakness on one side of your body. ? Trouble talking.  Your baby is not moving as much as usual. These symptoms may be an emergency. Get help right away. Call your local emergency services (911 in the [place]  Do not wait to see if the symptoms will go away.  Do not drive yourself to the hospital. Summary  High blood pressure is also called hypertension.  High blood pressure means that the force of the blood moving in your body is high enough to cause problems for you and your baby.  Get help right away if you have symptoms of serious problems due to high blood pressure.  Keep all prenatal and follow-up visits. This information is not intended to replace advice given to you by your health care provider. Make sure you discuss any questions you have with your health care provider. Document Revised: 10/25/2019 Document Reviewed: 10/25/2019 Elsevier Patient Education  2021 [employer]vier Inc.  

## 2020-03-18 ENCOUNTER — Telehealth (HOSPITAL_COMMUNITY): Payer: Self-pay | Admitting: *Deleted

## 2020-03-18 ENCOUNTER — Encounter (HOSPITAL_COMMUNITY): Payer: Self-pay | Admitting: *Deleted

## 2020-03-18 NOTE — Telephone Encounter (Signed)
Preadmission screen  

## 2020-03-19 ENCOUNTER — Other Ambulatory Visit: Payer: Self-pay

## 2020-03-19 ENCOUNTER — Encounter (HOSPITAL_COMMUNITY): Payer: Self-pay | Admitting: Obstetrics and Gynecology

## 2020-03-19 ENCOUNTER — Inpatient Hospital Stay (HOSPITAL_COMMUNITY): Payer: BC Managed Care – PPO

## 2020-03-19 ENCOUNTER — Inpatient Hospital Stay (HOSPITAL_COMMUNITY)
Admission: AD | Admit: 2020-03-19 | Discharge: 2020-03-21 | DRG: 797 | Disposition: A | Discharge status: Home / Self Care | Payer: BC Managed Care – PPO | Attending: Obstetrics and Gynecology | Admitting: Obstetrics and Gynecology

## 2020-03-19 DIAGNOSIS — O9832 Other infections with a predominantly sexual mode of transmission complicating childbirth: Secondary | ICD-10-CM | POA: Diagnosis present

## 2020-03-19 DIAGNOSIS — D573 Sickle-cell trait: Secondary | ICD-10-CM | POA: Diagnosis present

## 2020-03-19 DIAGNOSIS — O139 Gestational [pregnancy-induced] hypertension without significant proteinuria, unspecified trimester: Secondary | ICD-10-CM | POA: Diagnosis present

## 2020-03-19 DIAGNOSIS — Z3A37 37 weeks gestation of pregnancy: Secondary | ICD-10-CM | POA: Diagnosis not present

## 2020-03-19 DIAGNOSIS — Z87891 Personal history of nicotine dependence: Secondary | ICD-10-CM

## 2020-03-19 DIAGNOSIS — O9902 Anemia complicating childbirth: Secondary | ICD-10-CM | POA: Diagnosis present

## 2020-03-19 DIAGNOSIS — O134 Gestational [pregnancy-induced] hypertension without significant proteinuria, complicating childbirth: Principal | ICD-10-CM | POA: Diagnosis present

## 2020-03-19 DIAGNOSIS — A6000 Herpesviral infection of urogenital system, unspecified: Secondary | ICD-10-CM | POA: Diagnosis present

## 2020-03-19 LAB — CBC
HCT: 33.7 % — ABNORMAL LOW (ref 36.0–46.0)
Hemoglobin: 10.4 g/dL — ABNORMAL LOW (ref 12.0–15.0)
MCH: 24.3 pg — ABNORMAL LOW (ref 26.0–34.0)
MCHC: 30.9 g/dL (ref 30.0–36.0)
MCV: 78.7 fL — ABNORMAL LOW (ref 80.0–100.0)
Platelets: 424 10*3/uL — ABNORMAL HIGH (ref 150–400)
RBC: 4.28 MIL/uL (ref 3.87–5.11)
RDW: 16.2 % — ABNORMAL HIGH (ref 11.5–15.5)
WBC: 7.5 10*3/uL (ref 4.0–10.5)
nRBC: 0 % (ref 0.0–0.2)

## 2020-03-19 LAB — LACTATE DEHYDROGENASE: LDH: 168 U/L (ref 98–192)

## 2020-03-19 LAB — COMPREHENSIVE METABOLIC PANEL
ALT: 15 U/L (ref 0–44)
AST: 21 U/L (ref 15–41)
Albumin: 2.7 g/dL — ABNORMAL LOW (ref 3.5–5.0)
Alkaline Phosphatase: 148 U/L — ABNORMAL HIGH (ref 38–126)
Anion gap: 11 (ref 5–15)
BUN: 9 mg/dL (ref 6–20)
CO2: 20 mmol/L — ABNORMAL LOW (ref 22–32)
Calcium: 9 mg/dL (ref 8.9–10.3)
Chloride: 106 mmol/L (ref 98–111)
Creatinine, Ser: 0.75 mg/dL (ref 0.44–1.00)
GFR, Estimated: >60 mL/min (ref >60)
Glucose, Bld: 86 mg/dL (ref 70–99)
Potassium: 4 mmol/L (ref 3.5–5.1)
Sodium: 137 mmol/L (ref 135–145)
Total Bilirubin: 0.7 mg/dL (ref 0.3–1.2)
Total Protein: 6 g/dL — ABNORMAL LOW (ref 6.5–8.1)

## 2020-03-19 LAB — PROTEIN / CREATININE RATIO, URINE
Creatinine, Urine: 61.92 mg/dL
Total Protein, Urine: <6 mg/dL

## 2020-03-19 LAB — RPR: RPR Ser Ql: NONREACTIVE

## 2020-03-19 LAB — TYPE AND SCREEN
ABO/RH(D): O POS
Antibody Screen: NEGATIVE

## 2020-03-19 MED ORDER — PHENYLEPHRINE 40 MCG/ML (10ML) SYRINGE FOR IV PUSH (FOR BLOOD PRESSURE SUPPORT): 80.0000 ug | PREFILLED_SYRINGE | INTRAVENOUS | Status: DC | PRN

## 2020-03-19 MED ORDER — LACTATED RINGERS IV SOLN: INTRAVENOUS | Status: DC

## 2020-03-19 MED ORDER — FENTANYL-BUPIVACAINE-NACL 0.5-0.125-0.9 MG/250ML-% EP SOLN
12.0000 mL/h | EPIDURAL | Status: DC | PRN
  Administered 2020-03-20: 12 mL/h via EPIDURAL
  Filled 2020-03-19 (×2): qty 250

## 2020-03-19 MED ORDER — DIPHENHYDRAMINE HCL 50 MG/ML IJ SOLN: 12.5000 mg | INTRAMUSCULAR | Status: DC | PRN

## 2020-03-19 MED ORDER — EPHEDRINE 5 MG/ML INJ: 10.0000 mg | INTRAVENOUS | Status: DC | PRN

## 2020-03-19 MED ORDER — ONDANSETRON HCL 4 MG/2ML IJ SOLN
4.0000 mg | Freq: Four times a day (QID) | INTRAMUSCULAR | Status: DC | PRN
  Administered 2020-03-20: 4 mg via INTRAVENOUS
  Filled 2020-03-19: qty 2

## 2020-03-19 MED ORDER — LACTATED RINGERS IV SOLN
500.0000 mL | INTRAVENOUS | Status: DC | PRN
Start: 2020-03-19 — End: 2020-03-20

## 2020-03-19 MED ORDER — LACTATED RINGERS IV SOLN: 500.0000 mL | Freq: Once | INTRAVENOUS | Status: DC

## 2020-03-19 MED ORDER — LIDOCAINE HCL (PF) 1 % IJ SOLN: 30.0000 mL | INTRAMUSCULAR | Status: DC | PRN

## 2020-03-19 MED ORDER — ACETAMINOPHEN 325 MG PO TABS: 650.0000 mg | ORAL_TABLET | ORAL | Status: DC | PRN

## 2020-03-19 MED ORDER — FENTANYL CITRATE (PF) 100 MCG/2ML IJ SOLN
50.0000 ug | INTRAMUSCULAR | Status: DC | PRN
Start: 2020-03-19 — End: 2020-03-20
  Administered 2020-03-20: 100 ug via INTRAVENOUS
  Filled 2020-03-19: qty 2

## 2020-03-19 MED ORDER — SOD CITRATE-CITRIC ACID 500-334 MG/5ML PO SOLN
30.0000 mL | ORAL | Status: DC | PRN
  Administered 2020-03-19: 30 mL via ORAL
  Filled 2020-03-19: qty 15

## 2020-03-19 MED ORDER — HYDROXYZINE HCL 50 MG PO TABS: 50.0000 mg | ORAL_TABLET | Freq: Four times a day (QID) | ORAL | Status: DC | PRN

## 2020-03-19 MED ORDER — TERBUTALINE SULFATE 1 MG/ML IJ SOLN: 0.2500 mg | Freq: Once | INTRAMUSCULAR | Status: DC | PRN

## 2020-03-19 MED ORDER — PHENYLEPHRINE 40 MCG/ML (10ML) SYRINGE FOR IV PUSH (FOR BLOOD PRESSURE SUPPORT)
80.0000 ug | PREFILLED_SYRINGE | INTRAVENOUS | Status: DC | PRN
  Filled 2020-03-19: qty 10

## 2020-03-19 MED ORDER — OXYTOCIN-SODIUM CHLORIDE 30-0.9 UT/500ML-% IV SOLN: 2.5000 [IU]/h | INTRAVENOUS | Status: DC

## 2020-03-19 MED ORDER — OXYTOCIN BOLUS FROM INFUSION
333.0000 mL | Freq: Once | INTRAVENOUS | Status: AC
  Administered 2020-03-20: 333 mL via INTRAVENOUS

## 2020-03-19 MED ORDER — OXYTOCIN-SODIUM CHLORIDE 30-0.9 UT/500ML-% IV SOLN
1.0000 m[IU]/min | INTRAVENOUS | Status: DC
  Administered 2020-03-19: 2 m[IU]/min via INTRAVENOUS
  Filled 2020-03-19 (×2): qty 500

## 2020-03-19 NOTE — Progress Notes (Signed)
OB Progress Note  S: Patient resting comfortably. She is not feeling her contractions.  She feels very hungry and would like to eat.  O: BP 136/78   Pulse 70   Temp 98.2 F (36.8 C) (Oral)   Resp 16   Ht 5\' 2"  (1.575 m)   Wt 88.8 kg   LMP 07/07/2019 (Exact Date)   BMI 35.81 kg/m   FHT: 140bpm, moderate variablity, + accels, - decels Toco: q2-3, difficulty visualizing on toco SVE: 3/50/high, cervix very posterior, sutures palpated  A/P: 35 y.o. [id number]  @ [redacted]w[redacted]d admitted for IOL for gestational HTN.  FWB: Cat. I Labor course: Pitocin at 19mU/min -- will give pitocin break and allow for a meal Pain: Per patient request GBS: Negative Anticipate SVD  Drema Dallas, DO [phone] (office)

## 2020-03-19 NOTE — Progress Notes (Signed)
OB Progress Note  S: Patient resting comfortably. She just started to feel some contractions. Unsure if she desires epidural.  Agrees to AROM.  O: BP 131/74   Pulse (!) 103   Temp 98.4 F (36.9 C) (Oral)   Resp 18   Ht 5\' 2"  (1.575 m)   Wt 88.8 kg   LMP 07/07/2019 (Exact Date)   BMI 35.81 kg/m   FHT: 140bpm, moderate variablity, + accels, - decels Toco: not graphing SVE: 4/50/high, posterior, sutures palpated, IUPC placed AROM:  Clear, non-odorous  A/P: 35 y.o. [id number]  @ [redacted]w[redacted]d admitted for IOL for gestational HTN.  FWB: Cat. I Labor course: Pitocin at 46mU/min, AROM performed Pain: Per patient request GBS: Negative Anticipate SVD  Drema Dallas, DO [phone] (office)

## 2020-03-19 NOTE — H&P (Signed)
Toni Warren is a 35 y.o. female [phone] at 59 weeks and 3 days presenting for induction of labor due to gestational hypertension. Pt was seen in MAU on 03/17/2020 with elevated bp and headache. She denies current headache visual disturbances or ruq pain. .  She has a h/o preeclampsia and HELLP syndrome with previous pregnancy. H/O LEEP procedure in 2020. Prenatal care has been provided by Dr. Christophe Louis with [facility].    OB History    Gravida  4   Para  2   Term  2   Preterm  0   AB  1   Living  2     SAB  0   IAB  1   Ectopic  0   Multiple  0   Live Births  2          Past Medical History:  Diagnosis Date  . Abdominal hernia   . Abnormal Pap smear of cervix 2005   HPV +, 05-26-2018 LGSIL HPV HR+  . Anemia   . Anxiety   . Chlamydia 08/18/2015   repeat testing negative October 2017.  Marland Kitchen Condyloma acuminatum of vulva   . Depression    but doesn't require meds  . GERD (gastroesophageal reflux disease)    only takes OTC meds  . History of bronchitis 3+yrs ago  . History of migraine 09/20/11-last one  . HSV-1 (herpes simplex virus 1) infection   . HSV-2 (herpes simplex virus 2) infection   . Hyperlipidemia    postpartum 2+yrs ago  . Low vitamin D level 2018  . Migraine    with aura  . Pregnancy induced hypertension    previous pregnancy  . Sickle cell trait ([place])   . Sleeping difficulty   . Trichomonas infection 09/2016   Past Surgical History:  Procedure Laterality Date  . ABDOMINOPLASTY  09/29/2011   Procedure: ABDOMINOPLASTY;  Surgeon: Theodoro Kos, DO;  Location: [place];  Service: Plastics;  Laterality: N/A;  ABDOMINOPLASTY FOR REPAIR OF RECTUS DIASTASIS  . EYE SURGERY  1993  . INTRAUTERINE DEVICE (IUD) INSERTION     10-31-13 mirena iud inserted  . 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   . OVARIAN CYST REMOVAL  2012  . VENTRAL HERNIA REPAIR  09/29/2011   Procedure: HERNIA REPAIR VENTRAL ADULT;  Surgeon: Joyice Faster. Cornett, MD;  Location: [place] [facility];   Service: General;  Laterality: N/A;   Family History: family history includes Diabetes in her mother; Hypertension in her father and mother. Social History:  reports that she quit smoking about 13 years ago. Her smoking use included cigars. She has never used smokeless tobacco. She reports previous alcohol use. She reports that she does not use drugs.     Maternal Diabetes: No Genetic Screening: Normal Maternal Ultrasounds/Referrals: Normal Fetal Ultrasounds or other Referrals:  None Maternal Substance Abuse:  No Significant Maternal Medications:  None Significant Maternal Lab Results:  Group B Strep negative Other Comments:   pt with h/o hsv 2 on valtrex for suppression . no active lesions during delivery   Review of Systems  Constitutional: Negative.   HENT: Negative.   Eyes: Negative.   Respiratory: Negative.   Cardiovascular: Negative.   Gastrointestinal: Negative.   Endocrine: Negative.   Genitourinary: Negative.   Musculoskeletal: Negative.   Skin: Negative.   Allergic/Immunologic: Negative.   Neurological: Negative.   Hematological: Negative.   Psychiatric/Behavioral: Negative.    History Dilation: 2 Effacement (%): 60 Station: -2 Exam  by:: Dr. Landry Mellow Blood pressure 136/78, pulse 70, temperature 98.2 F (36.8 C), temperature source Oral, resp. rate 16, height 5\' 2"  (1.575 m), weight 88.8 kg, last menstrual period 07/07/2019. Maternal Exam:  Introitus: Normal vulva.   Physical Exam Vitals reviewed.  Constitutional:      Appearance: Normal appearance.  HENT:     Head: Normocephalic and atraumatic.  Cardiovascular:     Rate and Rhythm: Normal rate and regular rhythm.     Pulses: Normal pulses.     Heart sounds: Normal heart sounds.  Pulmonary:     Effort: Pulmonary effort is normal.     Breath sounds: Normal breath sounds.  Abdominal:     General: There is no distension.  Genitourinary:    General: Normal vulva.  Musculoskeletal:        General: Swelling  present. Normal range of motion.     Cervical back: Normal range of motion and neck supple.  Skin:    General: Skin is warm.  Neurological:     General: No focal deficit present.     Mental Status: She is alert and oriented to person, place, and time.  Psychiatric:        Mood and Affect: Mood normal.        Behavior: Behavior normal.    Cervix 2/50/-3 Fetal head is ballotable   Prenatal labs: ABO, Rh: --/--/O POS (02/02 7062) Antibody: NEG (02/02 0837) Rubella:  Immune  RPR: NON REACTIVE (02/02 0837)  HBsAg:   Negative  HIV:   Negative  GBS:  Negative    Assessment/Plan: 37 weeks and 3 days with gestational hypertension admitted for induction  - pitocin  Plan AROM with fetal descent.  - PIH labs negative  - GBS negative  - fetal well being category 1  - HSV 2 infection no current lesions  - anticipate SVD.  Dr. Delora Fuel to assume care after 5 pm today.    Christophe Louis 03/19/2020, 6:16 PM

## 2020-03-20 ENCOUNTER — Inpatient Hospital Stay (HOSPITAL_COMMUNITY): Payer: BC Managed Care – PPO | Admitting: Anesthesiology

## 2020-03-20 ENCOUNTER — Encounter (HOSPITAL_COMMUNITY): Payer: Self-pay | Admitting: Obstetrics and Gynecology

## 2020-03-20 LAB — CBC
HCT: 34.7 % — ABNORMAL LOW (ref 36.0–46.0)
Hemoglobin: 11.2 g/dL — ABNORMAL LOW (ref 12.0–15.0)
MCH: 25 pg — ABNORMAL LOW (ref 26.0–34.0)
MCHC: 32.3 g/dL (ref 30.0–36.0)
MCV: 77.5 fL — ABNORMAL LOW (ref 80.0–100.0)
Platelets: 440 10*3/uL — ABNORMAL HIGH (ref 150–400)
RBC: 4.48 MIL/uL (ref 3.87–5.11)
RDW: 16.2 % — ABNORMAL HIGH (ref 11.5–15.5)
WBC: 8.2 10*3/uL (ref 4.0–10.5)
nRBC: 0 % (ref 0.0–0.2)

## 2020-03-20 MED ORDER — ACETAMINOPHEN 325 MG PO TABS: 650.0000 mg | ORAL_TABLET | ORAL | Status: DC | PRN

## 2020-03-20 MED ORDER — PANTOPRAZOLE SODIUM 40 MG PO TBEC
40.0000 mg | DELAYED_RELEASE_TABLET | Freq: Once | ORAL | Status: AC
  Administered 2020-03-20: 40 mg via ORAL
  Filled 2020-03-20: qty 1

## 2020-03-20 MED ORDER — ONDANSETRON HCL 4 MG/2ML IJ SOLN: 4.0000 mg | INTRAMUSCULAR | Status: DC | PRN

## 2020-03-20 MED ORDER — COCONUT OIL OIL: 1.0000 "application " | TOPICAL_OIL | Status: DC | PRN

## 2020-03-20 MED ORDER — SIMETHICONE 80 MG PO CHEW: 80.0000 mg | CHEWABLE_TABLET | ORAL | Status: DC | PRN

## 2020-03-20 MED ORDER — SENNOSIDES-DOCUSATE SODIUM 8.6-50 MG PO TABS
2.0000 | ORAL_TABLET | Freq: Every day | ORAL | Status: DC
  Administered 2020-03-21: 2 via ORAL
  Filled 2020-03-20: qty 2

## 2020-03-20 MED ORDER — ZOLPIDEM TARTRATE 5 MG PO TABS: 5.0000 mg | ORAL_TABLET | Freq: Every evening | ORAL | Status: DC | PRN

## 2020-03-20 MED ORDER — PRENATAL MULTIVITAMIN CH
1.0000 | ORAL_TABLET | Freq: Every day | ORAL | Status: DC
  Administered 2020-03-21: 1 via ORAL
  Filled 2020-03-20: qty 1

## 2020-03-20 MED ORDER — ONDANSETRON HCL 4 MG PO TABS: 4.0000 mg | ORAL_TABLET | ORAL | Status: DC | PRN

## 2020-03-20 MED ORDER — SODIUM CHLORIDE 0.9 % IV SOLN
3.0000 g | Freq: Once | INTRAVENOUS | Status: DC
  Filled 2020-03-20: qty 8

## 2020-03-20 MED ORDER — DIBUCAINE (PERIANAL) 1 % EX OINT: 1.0000 "application " | TOPICAL_OINTMENT | CUTANEOUS | Status: DC | PRN

## 2020-03-20 MED ORDER — SODIUM CHLORIDE 0.9 % IV SOLN
3.0000 g | Freq: Once | INTRAVENOUS | Status: AC
  Administered 2020-03-20: 3 g via INTRAVENOUS

## 2020-03-20 MED ORDER — BENZOCAINE-MENTHOL 20-0.5 % EX AERO
1.0000 "application " | INHALATION_SPRAY | CUTANEOUS | Status: DC | PRN
  Administered 2020-03-20: 1 via TOPICAL
  Filled 2020-03-20: qty 56

## 2020-03-20 MED ORDER — LIDOCAINE-EPINEPHRINE (PF) 2 %-1:200000 IJ SOLN
INTRAMUSCULAR | Status: DC | PRN
  Administered 2020-03-20: 5 mL via EPIDURAL

## 2020-03-20 MED ORDER — WITCH HAZEL-GLYCERIN EX PADS: 1.0000 "application " | MEDICATED_PAD | CUTANEOUS | Status: DC | PRN

## 2020-03-20 MED ORDER — DIPHENHYDRAMINE HCL 25 MG PO CAPS: 25.0000 mg | ORAL_CAPSULE | Freq: Four times a day (QID) | ORAL | Status: DC | PRN

## 2020-03-20 MED ORDER — IBUPROFEN 600 MG PO TABS
600.0000 mg | ORAL_TABLET | Freq: Four times a day (QID) | ORAL | Status: DC
  Administered 2020-03-20 – 2020-03-21 (×4): 600 mg via ORAL
  Filled 2020-03-20 (×4): qty 1

## 2020-03-20 MED ORDER — PANTOPRAZOLE SODIUM 40 MG PO TBEC
40.0000 mg | DELAYED_RELEASE_TABLET | Freq: Every day | ORAL | Status: DC
  Filled 2020-03-20: qty 1

## 2020-03-20 NOTE — Anesthesia Preprocedure Evaluation (Signed)
Anesthesia Evaluation  Patient identified by MRN, date of birth, ID band Patient awake    Reviewed: Allergy & Precautions, NPO status , Patient's Chart, lab work & pertinent test results  Airway Mallampati: II  TM Distance: >3 FB Neck ROM: Full    Dental no notable dental hx.    Pulmonary neg pulmonary ROS, former smoker,    Pulmonary exam normal breath sounds clear to auscultation       Cardiovascular hypertension (gHTN), Normal cardiovascular exam Rhythm:Regular Rate:Normal     Neuro/Psych  Headaches, PSYCHIATRIC DISORDERS Anxiety Depression    GI/Hepatic Neg liver ROS, GERD  ,  Endo/Other  negative endocrine ROS  Renal/GU negative Renal ROS  negative genitourinary   Musculoskeletal negative musculoskeletal ROS (+)   Abdominal   Peds  Hematology  (+) Blood dyscrasia (Hgb 10.4), Sickle cell trait and anemia ,   Anesthesia Other Findings IOL for gHTN  Reproductive/Obstetrics (+) Pregnancy                             Anesthesia Physical Anesthesia Plan  ASA: III  Anesthesia Plan: Epidural   Post-op Pain Management:    Induction:   PONV Risk Score and Plan: Treatment may vary due to age or medical condition  Airway Management Planned: Natural Airway  Additional Equipment:   Intra-op Plan:   Post-operative Plan:   Informed Consent: I have reviewed the patients History and Physical, chart, labs and discussed the procedure including the risks, benefits and alternatives for the proposed anesthesia with the patient or authorized representative who has indicated his/her understanding and acceptance.       Plan Discussed with: Anesthesiologist  Anesthesia Plan Comments: (Patient identified. Risks, benefits, options discussed with patient including but not limited to bleeding, infection, nerve damage, paralysis, failed block, incomplete pain control, headache, blood pressure  changes, nausea, vomiting, reactions to medication, itching, and post partum back pain. Confirmed with bedside nurse the patient's most recent platelet count. Confirmed with the patient that they are not taking any anticoagulation, have any bleeding history or any family history of bleeding disorders. Patient expressed understanding and wishes to proceed. All questions were answered. )        Anesthesia Quick Evaluation

## 2020-03-20 NOTE — Plan of Care (Signed)
Davis, RN 

## 2020-03-20 NOTE — Progress Notes (Signed)
MOB was referred for history of depression/anxiety. * Referral screened out by Clinical Social Worker because none of the following criteria appear to apply: ~ History of anxiety/depression during this pregnancy, or of post-partum depression following prior delivery. ~ Diagnosis of anxiety and/or depression within last 3 years. Per further chart review it is noted that MOB was diagnosed in 2013 with anxiety and depression.  OR * MOB's symptoms currently being treated with medication and/or therapy.   Please contact the Clinical Social Worker if needs arise, by [facility] request, or if MOB scores greater than 9/yes to question 10 on Edinburgh Postpartum Depression Screen.    Virgie Dad Corlette Ciano, MSW, LCSW [facility] [place] [facility] [place] [phone]

## 2020-03-20 NOTE — Lactation Note (Signed)
This note was copied from a baby's chart. Lactation Consultation Note  Patient Name: Toni Warren [id number] Date: 03/20/2020 Reason for consult: L&D Initial assessment Age:35 hours   P3, Infant [redacted]w[redacted]d.  Assisted with latching STS and baby latched easily.   Reported to mother that lactation will follow up in [place].    Maternal Data Does the patient have breastfeeding experience prior to this delivery?: Yes  Feeding    LATCH Score Latch: Grasps breast easily, tongue down, lips flanged, rhythmical sucking.  Audible Swallowing: A few with stimulation  Type of Nipple: Everted at rest and after stimulation  Comfort (Breast/Nipple): Soft / non-tender  Hold (Positioning): Assistance needed to correctly position infant at breast and maintain latch.  LATCH Score: 8   Lactation Tools Discussed/Used    Interventions Interventions: Breast feeding basics reviewed;Skin to skin    Consult Status Consult Status: Follow-up Date: 03/20/20 Follow-up type: In-patient   Vivianne Master [facility] 03/20/2020, 12:40 PM

## 2020-03-20 NOTE — Lactation Note (Signed)
This note was copied from a baby's chart. Lactation Consultation Note  Patient Name: Toni Warren [id number] Date: 03/20/2020 Age:35 hours  Mother and infant are resting upon visit. LC will come back to room at another time as possible.     [place] 03/20/2020, 9:22 PM

## 2020-03-20 NOTE — Progress Notes (Signed)
Toni Warren is a 35 y.o. [id number] at [redacted]w[redacted]d by ultrasound admitted for induction of labor due to gestational hypertension .  Subjective:  Patient reports feeling pressure. She is otherwise comfortable with her epidural. She denies headache visual disturbance or ruq pain.   Objective: BP 138/73   Pulse 81   Temp 97.7 F (36.5 C) (Oral)   Resp 18   Ht 5\' 2"  (1.575 m)   Wt 88.8 kg   LMP 07/07/2019 (Exact Date)   SpO2 99%   BMI 35.81 kg/m  I/O last 3 completed shifts: In: 150 [Other:150] Out: -  No intake/output data recorded.  FHT:  FHR: 130 bpm, variability: moderate,  accelerations:  Present,  decelerations:  Absent UC:   regular, every 2-3 minutes SVE:   Dilation: 5 Effacement (%): 80 Station: -1 Exam by:: Chief Financial Officer at [employer] am   Labs: Lab Results  Component Value Date   WBC 8.2 03/20/2020   HGB 11.2 (L) 03/20/2020   HCT 34.7 (L) 03/20/2020   MCV 77.5 (L) 03/20/2020   PLT 440 (H) 03/20/2020    Assessment / Plan: Induction of labor due to gestational hypertension,  progressing well on pitocin  Labor: Progressing normally Preeclampsia:  labs stable Fetal Wellbeing:  Category I Pain Control:  Epidural I/D:  n/a Anticipated MOD:  NSVD  Christophe Louis 03/20/2020, 7:41 AM

## 2020-03-20 NOTE — Anesthesia Procedure Notes (Signed)

## 2020-03-21 LAB — CBC
HCT: 27.3 % — ABNORMAL LOW (ref 36.0–46.0)
Hemoglobin: 9 g/dL — ABNORMAL LOW (ref 12.0–15.0)
MCH: 25.4 pg — ABNORMAL LOW (ref 26.0–34.0)
MCHC: 33 g/dL (ref 30.0–36.0)
MCV: 77.1 fL — ABNORMAL LOW (ref 80.0–100.0)
Platelets: 393 10*3/uL (ref 150–400)
RBC: 3.54 MIL/uL — ABNORMAL LOW (ref 3.87–5.11)
RDW: 16.2 % — ABNORMAL HIGH (ref 11.5–15.5)
WBC: 13.3 10*3/uL — ABNORMAL HIGH (ref 4.0–10.5)
nRBC: 0 % (ref 0.0–0.2)

## 2020-03-21 MED ORDER — IBUPROFEN 800 MG PO TABS: 800.0000 mg | ORAL_TABLET | Freq: Three times a day (TID) | ORAL | 0 refills | Status: DC | PRN

## 2020-03-21 NOTE — Lactation Note (Signed)
This note was copied from a baby's chart. Lactation Consultation Note  Patient Name: Girl Sweet Jarvis [id number] Date: 03/21/2020 Reason for consult: Follow-up assessment Age:34 hours   P3 mother whose infant is now 80 hours old.  This is an ETI at 37+4 weeks.    Mother has been exclusively breast feeding.  Baby is voiding/stooling adequately.  Bilirubin level is 8.6 mg/dl at 20 hours of life.  Baby has a 3% weight loss this morning.  Mother had no questions/concerns related to breast feeding.  She feels like her daughter is latching and feeding well.  She is familiar with hand expression and can express drops of colostrum.  Encouraged to feed back any EBM she obtains to baby.   Parents uncertain as to whether or not they will be discharged yet today.  RN in room obtaining another bilirubin level.  Mother received another BP check.    Reviewed engorgement prevention/treatment.  Manual pump provided with instructions for use.  #24 flange size is appropriate at this time.  Provided a #27 flange size also since I feel mother will need this size in the near future.  Mother appreciative.  She has a DEBP for home use.  Father present.   Maternal Data    Feeding    LATCH Score                    Lactation Tools Discussed/Used    Interventions    Discharge    Consult Status Consult Status: Complete Date: 03/21/20 Follow-up type: Call as needed    Lanice Schwab Cece Milhouse 03/21/2020, 3:55 PM

## 2020-03-21 NOTE — Progress Notes (Signed)
Called Dr. Delora Fuel with current BP of patient. Patient denies headache, blurred vision, epigastric pain or any other symptom related to elevated BP. Dr. Delora Fuel reviewed BP and will cont to discharge patient. Patient has follow up appointment at the office on Wednesday, Feb 9 for BP check. Patient to call for any other s/s of elevated BP.

## 2020-03-21 NOTE — Discharge Summary (Signed)
Postpartum Discharge Summary  03/21/20     Patient Name: Toni Warren DOB: 12/29/1985 MRN: [id number]  Date of admission: 03/19/2020 Delivery date:03/20/2020  Delivering provider: Christophe Louis  Date of discharge: 03/21/2020  Admitting diagnosis: Gestational hypertension [O13.9] Intrauterine pregnancy: [redacted]w[redacted]d    Secondary diagnosis:  Active Problems:   Gestational hypertension  Additional problems: HSV, Sickle cell trait, History of HELLP Syndrome, Hx LEEP   Discharge diagnosis: Term Pregnancy Delivered and Gestational Hypertension                                              Post partum procedures:None Augmentation: AROM and Pitocin Complications: None  Hospital course: Induction of Labor With Vaginal Delivery   35y.o. yo [id number] admitted to the hospital 03/19/2020 for induction of labor.  Indication for induction: Gestational hypertension.  Patient had an uncomplicated labor course as follows: Membrane Rupture Time/Date: 10:21 PM ,03/19/2020   Delivery Method:Vaginal, Spontaneous  Episiotomy: None  Lacerations:  1st degree  Details of delivery can be found in separate delivery note.  Patient had a routine postpartum course. Patient is discharged home 03/21/20.  Newborn Data: Birth date:03/20/2020  Birth time:11:26 AM  Gender:Female  Living status:Living  Apgars:9 ,9  Weight:2990 g   Magnesium Sulfate received: No BMZ received: No Rhophylac:N/A MMR:No T-DaP:Given prenatally Flu: Unknown Transfusion:No  Physical exam  Vitals:   03/20/20 1520 03/20/20 1940 03/20/20 2341 03/21/20 0340  BP: 134/84 128/77 122/85 130/74  Pulse: 69 69  97  Resp: _0 Temp: 98.1 F (36.7 C) 98.4 F (36.9 C) 98.5 F (36.9 C)   TempSrc: Oral Oral Oral   SpO2: 99% 97% 97% 98%  Weight:      Height:       General: alert, cooperative and no distress Lochia: appropriate Uterine Fundus: firm Incision: N/A DVT Evaluation: No evidence of DVT seen on physical exam. No cords or  calf tenderness. Labs: Lab Results  Component Value Date   WBC 13.3 (H) 03/21/2020   HGB 9.0 (L) 03/21/2020   HCT 27.3 (L) 03/21/2020   MCV 77.1 (L) 03/21/2020   PLT 393 03/21/2020   CMP Latest Ref Rng & Units 03/19/2020  Glucose 70 - 99 mg/dL 86  BUN 6 - 20 mg/dL 9  Creatinine 0.44 - 1.00 mg/dL 0.75  Sodium 135 - 145 mmol/L 137  Potassium 3.5 - 5.1 mmol/L 4.0  Chloride 98 - 111 mmol/L 106  CO2 22 - 32 mmol/L 20(L)  Calcium 8.9 - 10.3 mg/dL 9.0  Total Protein 6.5 - 8.1 g/dL 6.0(L)  Total Bilirubin 0.3 - 1.2 mg/dL 0.7  Alkaline Phos 38 - 126 U/L 148(H)  AST 15 - 41 U/L 21  ALT 0 - 44 U/L 15   Edinburgh Score: No flowsheet data found.    After visit meds:  Allergies as of 03/21/2020      Reactions   Other Itching   Latex condoms   Percocet [oxycodone-acetaminophen] Itching      Medication List    TAKE these medications   acetaminophen 500 MG tablet Commonly known as: TYLENOL Take 1,000 mg by mouth every 6 (six) hours as needed for mild pain or headache.   aspirin EC 81 MG tablet Take 81 mg by mouth daily. Swallow whole.   ibuprofen 800 MG tablet Commonly known as: ADVIL Take 1 tablet (  800 mg total) by mouth every 8 (eight) hours as needed.   ondansetron 4 MG disintegrating tablet Commonly known as: Zofran ODT Take 1 tablet (4 mg total) by mouth every 8 (eight) hours as needed for nausea or vomiting.   pantoprazole 40 MG tablet Commonly known as: PROTONIX Take 40 mg by mouth daily.   valACYclovir 1000 MG tablet Commonly known as: VALTREX Take 1,000 mg by mouth daily.        Discharge home in stable condition Infant Feeding: Breast Infant Disposition:home with mother Discharge instruction: per After Visit Summary and Postpartum booklet. Activity: Advance as tolerated. Pelvic rest for 6 weeks.  Diet: routine diet Anticipated Birth Control: None Postpartum Appointment:1 week and 6 weeks Additional Postpartum F/U: BP check 1 week Future  Appointments:No future appointments. Follow up Visit:  Follow-up Information    Christophe Louis, MD Follow up in 1 week(s).   Specialty: Obstetrics and Gynecology Why: 1 week blood pressure check and 6 week PPV. Contact information: [address] [place] [address] [phone]                   03/21/2020 Drema Dallas, DO

## 2020-03-23 ENCOUNTER — Inpatient Hospital Stay (HOSPITAL_COMMUNITY)
Admission: AD | Admit: 2020-03-23 | Discharge: 2020-03-23 | Disposition: A | Discharge status: Home / Self Care | Payer: BC Managed Care – PPO | Attending: Obstetrics and Gynecology | Admitting: Obstetrics and Gynecology

## 2020-03-23 ENCOUNTER — Other Ambulatory Visit: Payer: Self-pay

## 2020-03-23 ENCOUNTER — Encounter (HOSPITAL_COMMUNITY): Payer: Self-pay | Admitting: Obstetrics and Gynecology

## 2020-03-23 DIAGNOSIS — H538 Other visual disturbances: Secondary | ICD-10-CM | POA: Insufficient documentation

## 2020-03-23 DIAGNOSIS — Z87891 Personal history of nicotine dependence: Secondary | ICD-10-CM | POA: Insufficient documentation

## 2020-03-23 DIAGNOSIS — O165 Unspecified maternal hypertension, complicating the puerperium: Secondary | ICD-10-CM | POA: Insufficient documentation

## 2020-03-23 DIAGNOSIS — Z7982 Long term (current) use of aspirin: Secondary | ICD-10-CM | POA: Insufficient documentation

## 2020-03-23 LAB — CBC
HCT: 34.7 % — ABNORMAL LOW (ref 36.0–46.0)
Hemoglobin: 10.7 g/dL — ABNORMAL LOW (ref 12.0–15.0)
MCH: 23.9 pg — ABNORMAL LOW (ref 26.0–34.0)
MCHC: 30.8 g/dL (ref 30.0–36.0)
MCV: 77.6 fL — ABNORMAL LOW (ref 80.0–100.0)
Platelets: 464 10*3/uL — ABNORMAL HIGH (ref 150–400)
RBC: 4.47 MIL/uL (ref 3.87–5.11)
RDW: 16.4 % — ABNORMAL HIGH (ref 11.5–15.5)
WBC: 7.3 10*3/uL (ref 4.0–10.5)
nRBC: 0 % (ref 0.0–0.2)

## 2020-03-23 LAB — COMPREHENSIVE METABOLIC PANEL
ALT: 27 U/L (ref 0–44)
AST: 37 U/L (ref 15–41)
Albumin: 2.8 g/dL — ABNORMAL LOW (ref 3.5–5.0)
Alkaline Phosphatase: 126 U/L (ref 38–126)
Anion gap: 11 (ref 5–15)
BUN: 12 mg/dL (ref 6–20)
CO2: 22 mmol/L (ref 22–32)
Calcium: 9.4 mg/dL (ref 8.9–10.3)
Chloride: 105 mmol/L (ref 98–111)
Creatinine, Ser: 0.83 mg/dL (ref 0.44–1.00)
GFR, Estimated: >60 mL/min (ref >60)
Glucose, Bld: 80 mg/dL (ref 70–99)
Potassium: 3.8 mmol/L (ref 3.5–5.1)
Sodium: 138 mmol/L (ref 135–145)
Total Bilirubin: 0.4 mg/dL (ref 0.3–1.2)
Total Protein: 6.1 g/dL — ABNORMAL LOW (ref 6.5–8.1)

## 2020-03-23 LAB — PROTEIN / CREATININE RATIO, URINE
Creatinine, Urine: 30.14 mg/dL
Total Protein, Urine: <6 mg/dL

## 2020-03-23 MED ORDER — HYDRALAZINE HCL 20 MG/ML IJ SOLN: 5.0000 mg | INTRAMUSCULAR | Status: DC | PRN

## 2020-03-23 MED ORDER — NIFEDIPINE ER OSMOTIC RELEASE 30 MG PO TB24
30.0000 mg | ORAL_TABLET | Freq: Once | ORAL | Status: AC
  Administered 2020-03-23: 30 mg via ORAL
  Filled 2020-03-23: qty 1

## 2020-03-23 MED ORDER — LABETALOL HCL 5 MG/ML IV SOLN: 20.0000 mg | INTRAVENOUS | Status: DC | PRN

## 2020-03-23 MED ORDER — NIFEDIPINE ER OSMOTIC RELEASE 30 MG PO TB24: 30.0000 mg | ORAL_TABLET | Freq: Every day | ORAL | 0 refills | Status: DC

## 2020-03-23 MED ORDER — HYDRALAZINE HCL 20 MG/ML IJ SOLN: 10.0000 mg | INTRAMUSCULAR | Status: DC | PRN

## 2020-03-23 MED ORDER — LABETALOL HCL 5 MG/ML IV SOLN: 40.0000 mg | INTRAVENOUS | Status: DC | PRN

## 2020-03-23 MED ORDER — BUTALBITAL-APAP-CAFFEINE 50-325-40 MG PO TABS
2.0000 | ORAL_TABLET | Freq: Once | ORAL | Status: AC
  Administered 2020-03-23: 2 via ORAL
  Filled 2020-03-23: qty 2

## 2020-03-23 NOTE — MAU Note (Signed)
Pt using BR pump-Fioricet given per order

## 2020-03-23 NOTE — MAU Provider Note (Signed)
History     CSN: [id number]  Arrival date and time: 03/23/20 1943   Event Date/Time   First Provider Initiated Contact with Patient 03/23/20 2104      Chief Complaint  Patient presents with  . Hypertension   HPI Toni Warren is a 35 y.o. [id number] postpartum from a vaginal delivery on 2/3 who presents with elevated blood pressures. She states she checked her BP at home and it was high. She reports one episode of blurred vision today but no floaters. She reports tightness on the right side of her head that she rates a 1/10 and tried tylenol for with no relief. She reports a history of HELLP/preeclampsia in previous pregnancies. She was induced for gestational hypertension with this pregnancy.   OB History    Gravida  4   Para  3   Term  3   Preterm  0   AB  1   Living  3     SAB  0   IAB  1   Ectopic  0   Multiple  0   Live Births  3           Past Medical History:  Diagnosis Date  . Abdominal hernia   . Abnormal Pap smear of cervix 2005   HPV +, 05-26-2018 LGSIL HPV HR+  . Anemia   . Anxiety   . Chlamydia 08/18/2015   repeat testing negative October 2017.  Marland Kitchen Condyloma acuminatum of vulva   . Depression    but doesn't require meds  . GERD (gastroesophageal reflux disease)    only takes OTC meds  . History of bronchitis 3+yrs ago  . History of migraine 09/20/11-last one  . HSV-1 (herpes simplex virus 1) infection   . HSV-2 (herpes simplex virus 2) infection   . Hyperlipidemia    postpartum 2+yrs ago  . Low vitamin D level 2018  . Migraine    with aura  . Pregnancy induced hypertension    previous pregnancy  . Sickle cell trait ([place])   . Sleeping difficulty   . Trichomonas infection 09/2016    Past Surgical History:  Procedure Laterality Date  . ABDOMINOPLASTY  09/29/2011   Procedure: ABDOMINOPLASTY;  Surgeon: Theodoro Kos, DO;  Location: [facility];  Service: Plastics;  Laterality: N/A;  ABDOMINOPLASTY FOR REPAIR OF RECTUS DIASTASIS  . EYE SURGERY   1993  . INTRAUTERINE DEVICE (IUD) INSERTION     10-31-13 mirena iud inserted  . 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   . OVARIAN CYST REMOVAL  2012  . VENTRAL HERNIA REPAIR  09/29/2011   Procedure: HERNIA REPAIR VENTRAL ADULT;  Surgeon: Joyice Faster. Cornett, MD;  Location: [facility];  Service: General;  Laterality: N/A;    Family History  Problem Relation Age of Onset  . Diabetes Mother   . Hypertension Mother   . Hypertension Father     Social History   Tobacco Use  . Smoking status: Former Smoker    Types: Cigars    Quit date: 09/21/2006    Years since quitting: 13.5  . Smokeless tobacco: Never Used  Vaping Use  . Vaping Use: Never used  Substance Use Topics  . Alcohol use: Not Currently    Alcohol/week: 0.0 standard drinks    Comment: occas  . Drug use: No    Allergies:  Allergies  Allergen Reactions  . Other Itching    Latex condoms  . Percocet [Oxycodone-Acetaminophen] Itching    Medications  Prior to Admission  Medication Sig Dispense Refill Last Dose  . acetaminophen (TYLENOL) 500 MG tablet Take 1,000 mg by mouth every 6 (six) hours as needed for mild pain or headache.   03/23/2020 at 1400  . aspirin EC 81 MG tablet Take 81 mg by mouth daily. Swallow whole.     . ibuprofen (ADVIL) 800 MG tablet Take 1 tablet (800 mg total) by mouth every 8 (eight) hours as needed. 30 tablet 0   . ondansetron (ZOFRAN ODT) 4 MG disintegrating tablet Take 1 tablet (4 mg total) by mouth every 8 (eight) hours as needed for nausea or vomiting. 30 tablet 1   . pantoprazole (PROTONIX) 40 MG tablet Take 40 mg by mouth daily.     . valACYclovir (VALTREX) 1000 MG tablet Take 1,000 mg by mouth daily.       Review of Systems  Constitutional: Negative.  Negative for fatigue and fever.  HENT: Negative.   Respiratory: Negative.  Negative for shortness of breath.   Cardiovascular: Negative.  Negative for chest pain.  Gastrointestinal: Negative.  Negative for abdominal pain, constipation,  diarrhea, nausea and vomiting.  Genitourinary: Negative.  Negative for dysuria, vaginal bleeding and vaginal discharge.  Neurological: Positive for headaches. Negative for dizziness.   Physical Exam   Blood pressure (!) 142/90, pulse 71, temperature 98.7 F (37.1 C), temperature source Oral, resp. rate 20, height 5\' 2"  (1.575 m), weight 83.5 kg, SpO2 100 %, unknown if currently breastfeeding. Patient Vitals for the past 24 hrs:  BP Temp Temp src Pulse Resp SpO2 Height Weight  03/23/20 2131 (!) 142/90 - - 71 - - - -  03/23/20 2130 - - - - - 100 % - -  03/23/20 2125 - - - - - 99 % - -  03/23/20 2120 - - - - - 100 % - -  03/23/20 2116 (!) 155/86 - - 72 - - - -  03/23/20 2101 (!) 154/82 - - 64 - - - -  03/23/20 2100 - - - - - 99 % - -  03/23/20 2055 - - - - - 99 % - -  03/23/20 2050 - - - - - 99 % - -  03/23/20 2046 (!) 147/84 - - 67 - - - -  03/23/20 2031 (!) 161/80 - - 81 - - - -  03/23/20 2020 (!) 165/91 98.7 F (37.1 C) Oral 68 20 98 % 5\' 2"  (1.575 m) 83.5 kg   Physical Exam Vitals and nursing note reviewed.  Constitutional:      General: She is not in acute distress.    Appearance: She is well-developed and well-nourished.  HENT:     Head: Normocephalic.  Eyes:     Pupils: Pupils are equal, round, and reactive to light.  Cardiovascular:     Rate and Rhythm: Normal rate and regular rhythm.     Heart sounds: Normal heart sounds.  Pulmonary:     Effort: Pulmonary effort is normal. No respiratory distress.     Breath sounds: Normal breath sounds.  Abdominal:     General: Bowel sounds are normal. There is no distension.     Palpations: Abdomen is soft.     Tenderness: There is no abdominal tenderness.  Skin:    General: Skin is warm and dry.  Neurological:     Mental Status: She is alert and oriented to person, place, and time.     Motor: No abnormal muscle tone.     Coordination: Coordination  normal.     Deep Tendon Reflexes: Reflexes are normal and symmetric. Reflexes  normal.  Psychiatric:        Mood and Affect: Mood and affect normal.        Behavior: Behavior normal.        Thought Content: Thought content normal.        Judgment: Judgment normal.     MAU Course  Procedures Results for orders placed or performed during the hospital encounter of 03/23/20 (from the past 24 hour(s))  Comprehensive metabolic panel     Status: Abnormal   Collection Time: 03/23/20  8:54 PM  Result Value Ref Range   Sodium 138 135 - 145 mmol/L   Potassium 3.8 3.5 - 5.1 mmol/L   Chloride 105 98 - 111 mmol/L   CO2 22 22 - 32 mmol/L   Glucose, Bld 80 70 - 99 mg/dL   BUN 12 6 - 20 mg/dL   Creatinine, Ser 0.83 0.44 - 1.00 mg/dL   Calcium 9.4 8.9 - 10.3 mg/dL   Total Protein 6.1 (L) 6.5 - 8.1 g/dL   Albumin 2.8 (L) 3.5 - 5.0 g/dL   AST 37 15 - 41 U/L   ALT 27 0 - 44 U/L   Alkaline Phosphatase 126 38 - 126 U/L   Total Bilirubin 0.4 0.3 - 1.2 mg/dL   GFR, Estimated >60 >60 mL/min   Anion gap 11 5 - 15  Protein / creatinine ratio, urine     Status: None   Collection Time: 03/23/20  8:54 PM  Result Value Ref Range   Creatinine, Urine 30.14 mg/dL   Total Protein, Urine <6 mg/dL   Protein Creatinine Ratio        0.00 - 0.15 mg/mg[Cre]  CBC     Status: Abnormal   Collection Time: 03/23/20  8:54 PM  Result Value Ref Range   WBC 7.3 4.0 - 10.5 K/uL   RBC 4.47 3.87 - 5.11 MIL/uL   Hemoglobin 10.7 (L) 12.0 - 15.0 g/dL   HCT 34.7 (L) 36.0 - 46.0 %   MCV 77.6 (L) 80.0 - 100.0 fL   MCH 23.9 (L) 26.0 - 34.0 pg   MCHC 30.8 30.0 - 36.0 g/dL   RDW 16.4 (H) 11.5 - 15.5 %   Platelets 464 (H) 150 - 400 K/uL   nRBC 0.0 0.0 - 0.2 %     MDM CBC, CMP Saline Lock Preeclampsia focused IV meds ordered due to initially severe range BPs Fioricet PO-patient reports improvement of pain.  Reviewed results and BPs with Dr. Rip Harbour- will start PO medication and have patient keep appointment on Wednesday for BP check in office.   Procardia PO  Discussed plan of care with patient and  partner. Patient agreeable to plan of care and verbalized understanding of when to return to MAU.  Assessment and Plan   1. Postpartum hypertension     -Discharge home in stable condition -Rx for procardia sent to patient's pharmacy -Preeclampsia precautions discussed -Patient advised to follow-up with OB as scheduled on Wednesday -Patient may return to MAU as needed or if her condition were to change or worsen   Wende Mott CNM 03/23/2020, 9:52 PM

## 2020-03-23 NOTE — MAU Note (Signed)
Elevated BP around 7pm 180/110, c/o of HA, dizziness and blurry. Pt was diagnosed with gestational hypertension during pregnancy, was not on med.  C/o of 'stinging" of breast, pumping and feeding ok, pt states it feels engorged. Had tylenol around 5 or 6pm. Had ibuprofen this morning.

## 2020-03-23 NOTE — Anesthesia Postprocedure Evaluation (Signed)
Anesthesia Post Note  Patient: Toni Warren  Procedure(s) Performed: AN AD [place]     Patient location during evaluation: Other Anesthesia Type: Epidural Level of consciousness: awake and alert and oriented Pain management: satisfactory to patient Vital Signs Assessment: post-procedure vital signs reviewed and stable Respiratory status: respiratory function stable Cardiovascular status: stable Postop Assessment: no headache, no backache, epidural receding, patient able to bend at knees, no signs of nausea or vomiting, adequate PO intake and able to ambulate Anesthetic complications: no Comments: Patient discharged to home without complications.   No complications documented.  Last Vitals:  Vitals:   03/21/20 1533 03/21/20 1833  BP: (!) 147/87 136/87  Pulse:  68  Resp:    Temp:    SpO2:      Last Pain:  Vitals:   03/21/20 1530  TempSrc:   PainSc: 0-No pain   Pain Goal:                   [name],[name]

## 2020-10-02 IMAGING — DX DG ANKLE COMPLETE 3+V*R*
3 series · 3 of 3 positions shown · non-contrast
Comparison: None.

CLINICAL DATA: Recent fall with inversion injury, initial encounter

EXAM:
RIGHT ANKLE - COMPLETE 3+ VIEW

[ankle ap]
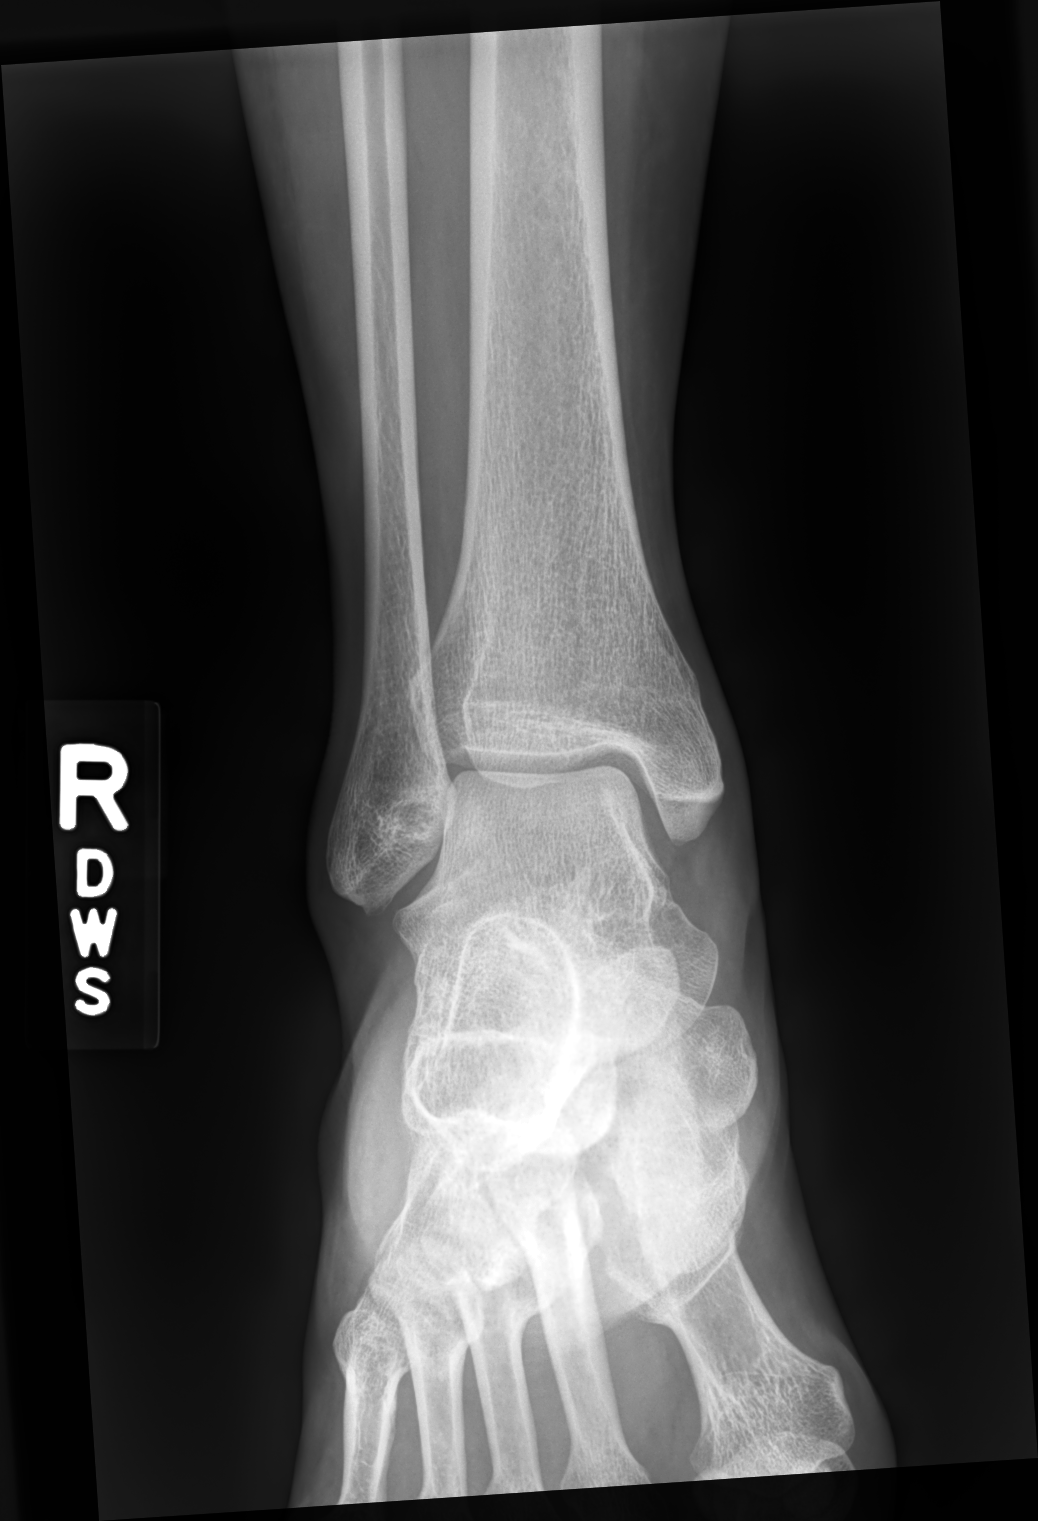

[ankle medial oblique]
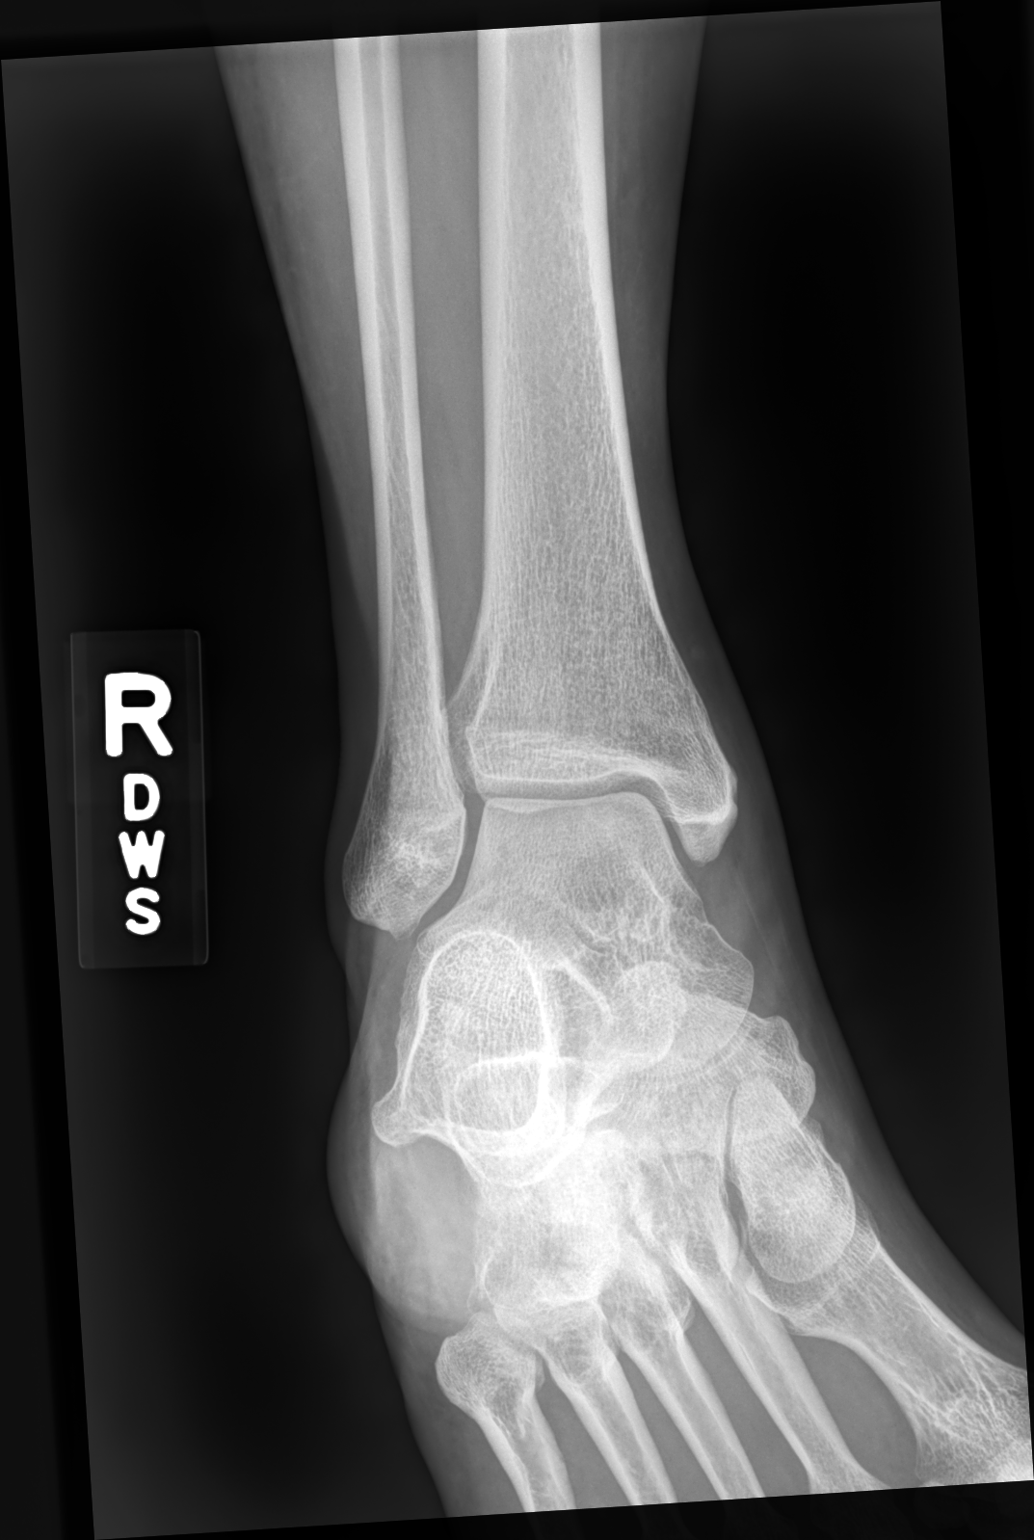

[ankle lat]
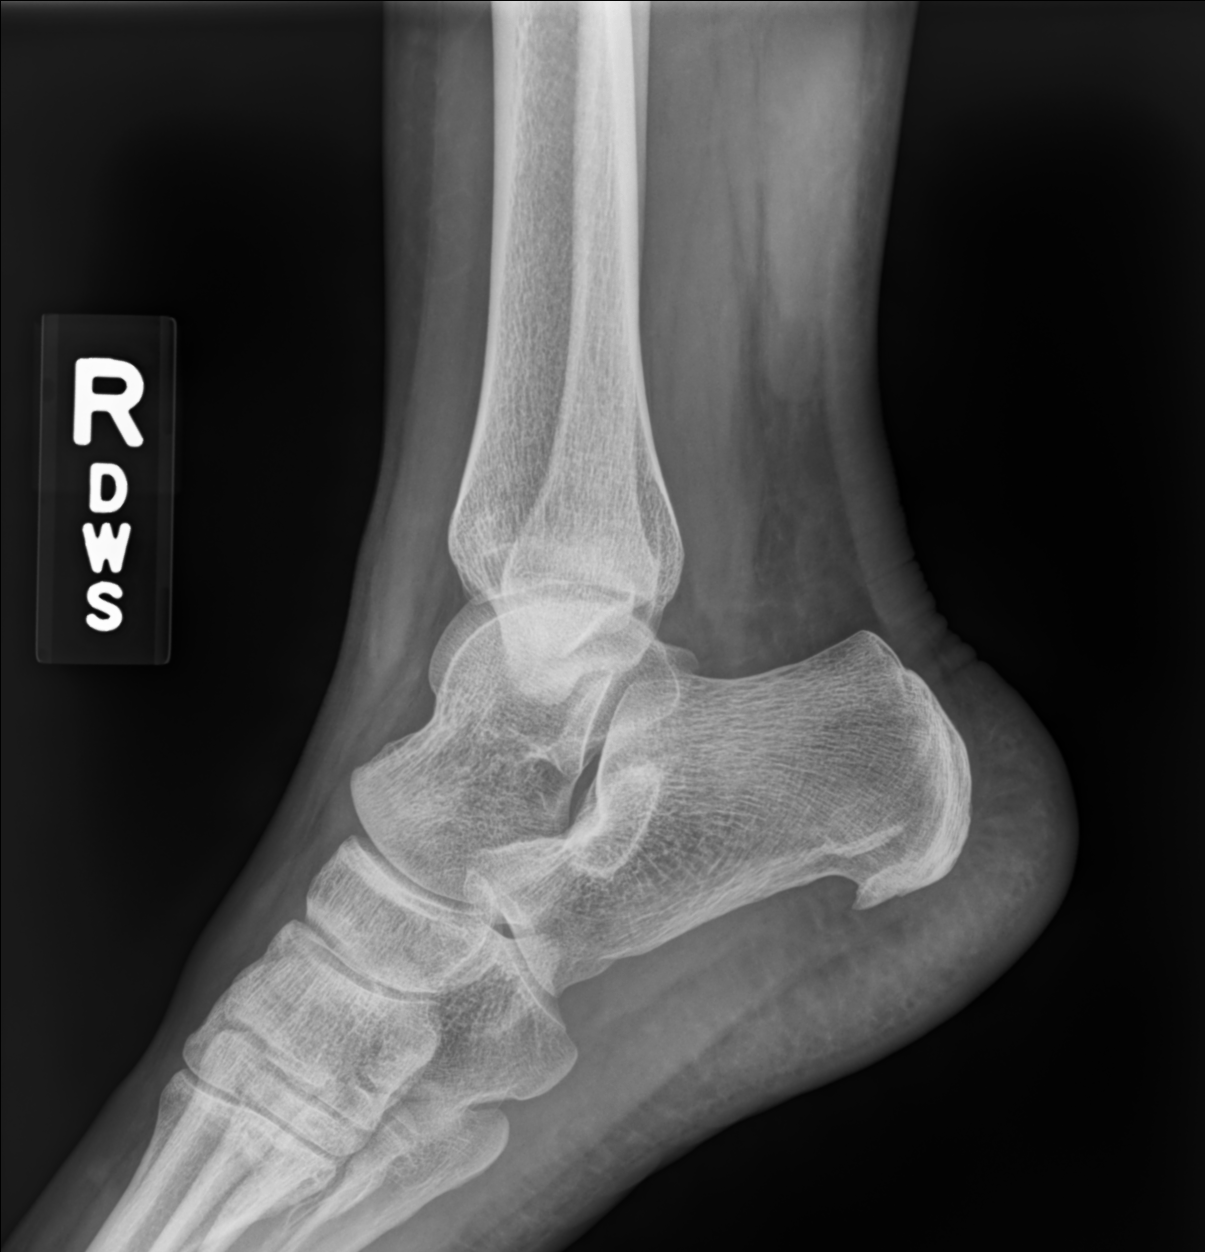

[3 of 3 positions shown; findings below may reference images not displayed]

FINDINGS: No acute fracture or dislocation is noted. Mild soft tissue swelling
is noted posteriorly. Small calcaneal spur is noted.
IMPRESSION: Soft tissue injury without acute bony abnormality.

## 2020-10-02 IMAGING — DX DG FOOT COMPLETE 3+V*R*
3 series · 3 of 3 positions shown · non-contrast
Comparison: None.

CLINICAL DATA: Recent inversion injury with foot pain, initial
encounter

EXAM:
RIGHT FOOT COMPLETE - 3+ VIEW

[foot supine dp]
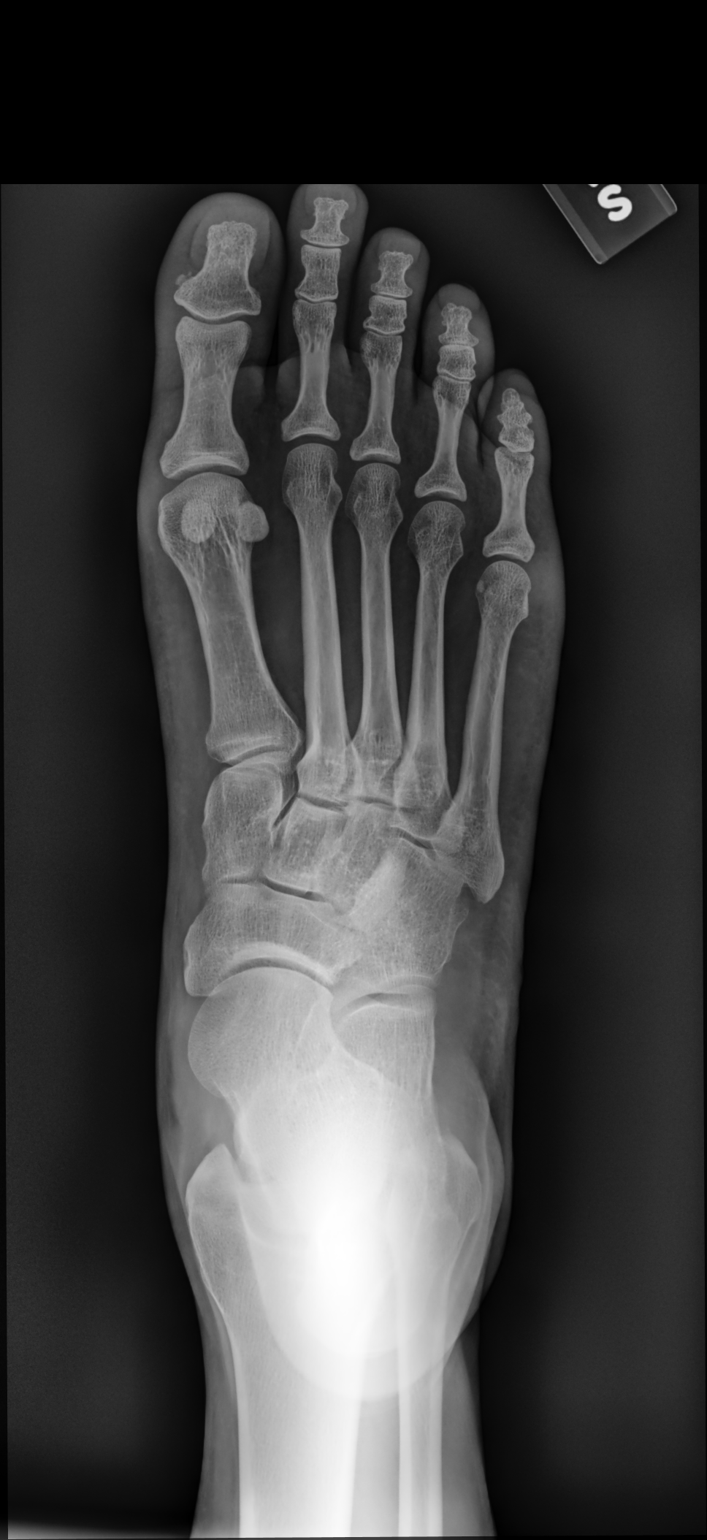

[foot medial oblique]
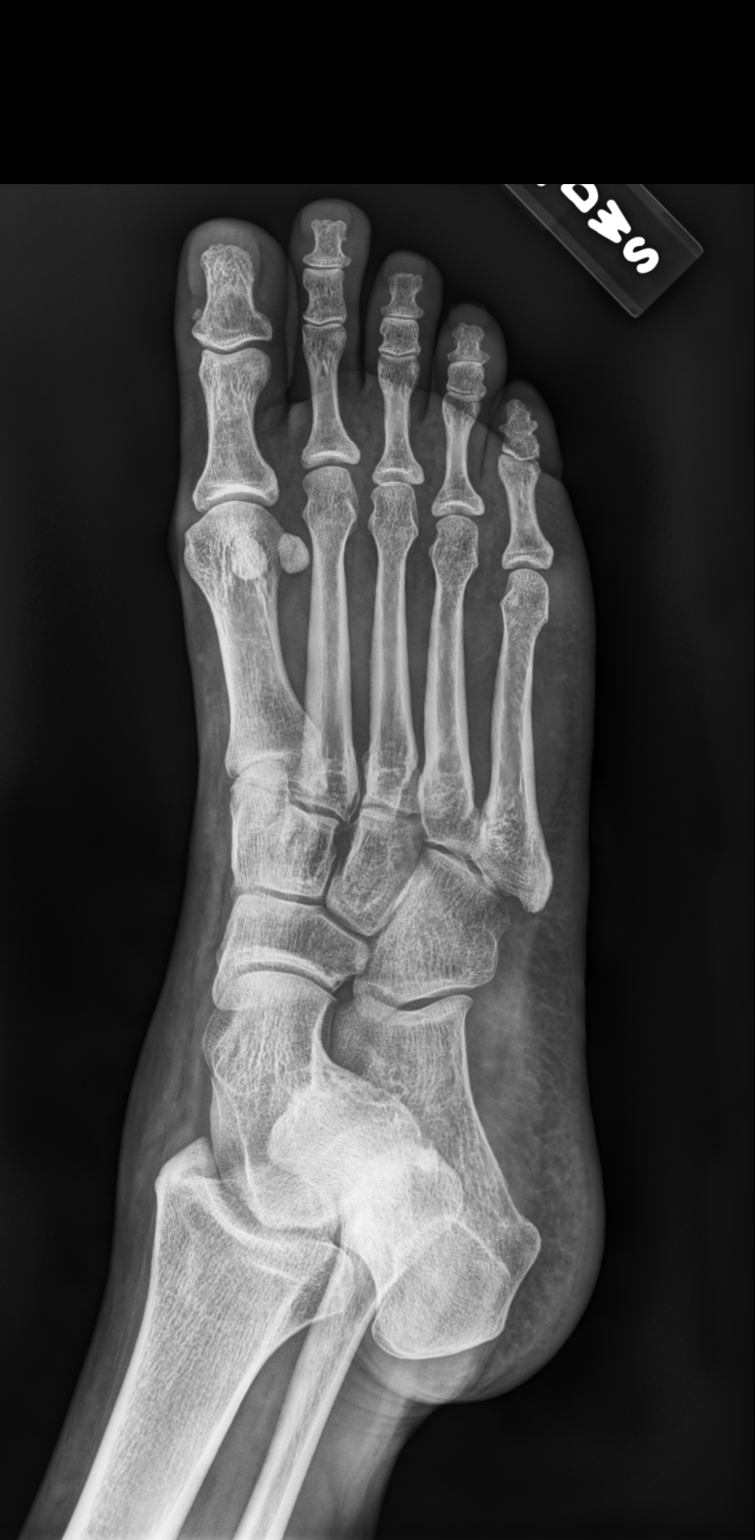

[foot supine lat]
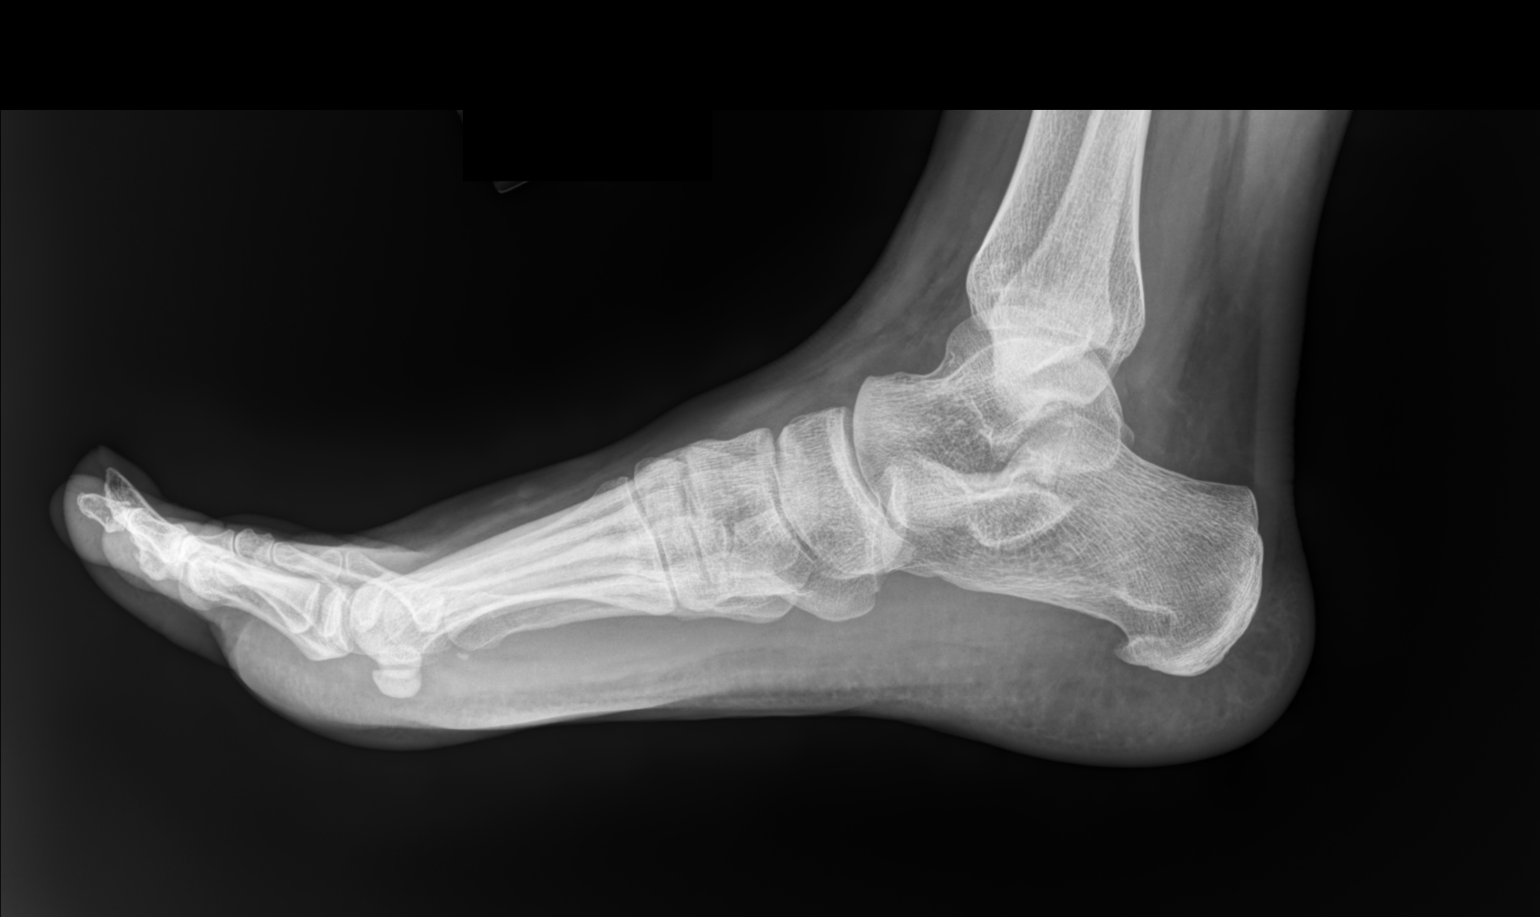

[3 of 3 positions shown; findings below may reference images not displayed]

FINDINGS: Small calcaneal spur is noted. No acute fracture or dislocation is
noted. No gross soft tissue abnormality is noted.
IMPRESSION: No acute abnormality noted.

## 2021-06-16 ENCOUNTER — Inpatient Hospital Stay (HOSPITAL_COMMUNITY)
Admission: AD | Admit: 2021-06-16 | Discharge: 2021-06-16 | Disposition: A | Discharge status: Home / Self Care | Payer: BC Managed Care – PPO | Attending: Obstetrics & Gynecology | Admitting: Obstetrics & Gynecology

## 2021-06-16 ENCOUNTER — Encounter (HOSPITAL_COMMUNITY): Payer: Self-pay

## 2021-06-16 DIAGNOSIS — Z79899 Other long term (current) drug therapy: Secondary | ICD-10-CM | POA: Insufficient documentation

## 2021-06-16 DIAGNOSIS — O26891 Other specified pregnancy related conditions, first trimester: Secondary | ICD-10-CM | POA: Diagnosis not present

## 2021-06-16 DIAGNOSIS — Z3A11 11 weeks gestation of pregnancy: Secondary | ICD-10-CM | POA: Insufficient documentation

## 2021-06-16 DIAGNOSIS — O161 Unspecified maternal hypertension, first trimester: Secondary | ICD-10-CM | POA: Diagnosis not present

## 2021-06-16 DIAGNOSIS — O09521 Supervision of elderly multigravida, first trimester: Secondary | ICD-10-CM | POA: Diagnosis not present

## 2021-06-16 DIAGNOSIS — I10 Essential (primary) hypertension: Secondary | ICD-10-CM

## 2021-06-16 MED ORDER — LABETALOL HCL 100 MG PO TABS
100.0000 mg | ORAL_TABLET | Freq: Once | ORAL | Status: AC
  Administered 2021-06-16: 100 mg via ORAL
  Filled 2021-06-16: qty 1

## 2021-06-16 MED ORDER — LABETALOL HCL 100 MG PO TABS: 100.0000 mg | ORAL_TABLET | Freq: Two times a day (BID) | ORAL | 2 refills | Status: DC

## 2021-06-16 NOTE — MAU Provider Note (Signed)
?History  ?  ? ?CSN: [id number] ? ?Arrival date and time: 06/16/21 1033 ? ? Event Date/Time  ? First Provider Initiated Contact with Patient 06/16/21 1112   ?  ? ?Chief Complaint  ?Patient presents with  ? Hypertension  ? ?Toni Warren is a 36 y.o. [id number] at 37w0dby LMP of 01/07/2022.  She presents today for Hypertension.  She states she was in process of terminating the pregnancy when she was told that she would have to be evaluated for her blood pressure.  She reports she was told she had to have treatment and a lower blood pressure in order for procedure to take place. She reports h/o gHTN and PP HTN, but none outside of those parameters.  She also reports a history of PreEclampsia.  She states she has seen PCP  COliva Bustardwith [place] last evaluated 3 months ago without signs of HTN. ? ? ? ? ?OB History   ? ? Gravida  ?5  ? Para  ?3  ? Term  ?3  ? Preterm  ?0  ? AB  ?1  ? Living  ?3  ?  ? ? SAB  ?0  ? IAB  ?1  ? Ectopic  ?0  ? Multiple  ?0  ? Live Births  ?3  ?   ?  ?  ? ? ?Past Medical History:  ?Diagnosis Date  ? Abdominal hernia   ? Abnormal Pap smear of cervix 2005  ? HPV +, 05-26-2018 LGSIL HPV HR+  ? Anemia   ? Anxiety   ? Chlamydia 08/18/2015  ? repeat testing negative October 2017.  ? Condyloma acuminatum of vulva   ? Depression   ? but doesn't require meds  ? GERD (gastroesophageal reflux disease)   ? only takes OTC meds  ? History of bronchitis 3+yrs ago  ? History of migraine 09/20/11-last one  ? HSV-1 (herpes simplex virus 1) infection   ? HSV-2 (herpes simplex virus 2) infection   ? Hyperlipidemia   ? postpartum 2+yrs ago  ? Low vitamin D level 2018  ? Migraine   ? with aura  ? Pregnancy induced hypertension   ? previous pregnancy  ? S[place] cell trait ([place]   ? Sleeping difficulty   ? Trichomonas infection 09/2016  ? ? ?Past Surgical History:  ?Procedure Laterality Date  ? ABDOMINOPLASTY  09/29/2011  ? Procedure: ABDOMINOPLASTY;  Surgeon: CTheodoro Kos DO;  [facility]  Service: Plastics;  Laterality: N/A;  ABDOMINOPLASTY FOR REPAIR OF RECTUS DIASTASIS  ? EYE SURGERY  1993  ? INTRAUTERINE DEVICE (IUD) INSERTION    ? 10-31-13 mirena iud inserted  ? LEEP  08/2018  ? CIN II - positive margins.   ? OVARIAN CYST REMOVAL  2012  ? VENTRAL HERNIA REPAIR  09/29/2011  ? Procedure: HERNIA REPAIR VENTRAL ADULT;  Surgeon: TJoyice Faster Cornett, MD;  Location[facility]  Service: General;  Laterality: N/A;  ? ? ?Family History  ?Problem Relation Age of Onset  ? Diabetes Mother   ? Hypertension Mother   ? Hypertension Father   ? ? ?Social History  ? ?Tobacco Use  ? Smoking status: Former  ?  Types: Cigars  ?  Quit date: 09/21/2006  ?  Years since quitting: 14.7  ? Smokeless tobacco: Never  ?Vaping Use  ? Vaping Use: Never used  ?Substance Use Topics  ? Alcohol use: Not Currently  ?  Alcohol/week: 0.0 standard drinks  ?  Comment: occas  ?  Drug use: No  ? ? ?Allergies:  ?Allergies  ?Allergen Reactions  ? Other Itching  ?  Latex condoms  ? Percocet [Oxycodone-Acetaminophen] Itching  ? ? ?Medications Prior to Admission  ?Medication Sig Dispense Refill Last Dose  ? acetaminophen (TYLENOL) 500 MG tablet Take 1,000 mg by mouth every 6 (six) hours as needed for mild pain or headache.     ? aspirin EC 81 MG tablet Take 81 mg by mouth daily. Swallow whole.     ? ibuprofen (ADVIL) 800 MG tablet Take 1 tablet (800 mg total) by mouth every 8 (eight) hours as needed. 30 tablet 0   ? NIFEdipine (PROCARDIA XL) 30 MG 24 hr tablet Take 1 tablet (30 mg total) by mouth daily. 30 tablet 0   ? ondansetron (ZOFRAN ODT) 4 MG disintegrating tablet Take 1 tablet (4 mg total) by mouth every 8 (eight) hours as needed for nausea or vomiting. 30 tablet 1   ? pantoprazole (PROTONIX) 40 MG tablet Take 40 mg by mouth daily.     ? valACYclovir (VALTREX) 1000 MG tablet Take 1,000 mg by mouth daily.     ? ? ?Review of Systems  ?Eyes:  Negative for visual disturbance.  ?Respiratory:  Negative for shortness of breath.   ?Cardiovascular:   Negative for chest pain.  ?Genitourinary:  Negative for difficulty urinating and dysuria.  ?Neurological:  Negative for dizziness, light-headedness and headaches.  ?Physical Exam  ? ?Blood pressure (!) 147/101, pulse 89, temperature 98.1 ?F (36.7 ?C), temperature source Oral, resp. rate 12, height '5\' 2"'$  (1.575 m), weight 79.4 kg, SpO2 100 %, unknown if currently breastfeeding. ? ?Vitals:  ? 06/16/21 1100 06/16/21 1116 06/16/21 1127 06/16/21 1131  ?BP: (!) 147/101 (!) 141/89 (!) 141/89 (!) 141/88  ? 06/16/21 1146 06/16/21 1201 06/16/21 1216 06/16/21 1241  ?BP: 138/84 135/88 135/85 133/85  ?  ? ?Physical Exam ?Constitutional:   ?   Appearance: Normal appearance.  ?HENT:  ?   Head: Normocephalic and atraumatic.  ?Eyes:  ?   Conjunctiva/sclera: Conjunctivae normal.  ?Cardiovascular:  ?   Rate and Rhythm: Normal rate and regular rhythm.  ?   Heart sounds: Normal heart sounds.  ?Pulmonary:  ?   Effort: Pulmonary effort is normal. No respiratory distress.  ?   Breath sounds: Normal breath sounds.  ?Abdominal:  ?   General: Bowel sounds are normal.  ?Musculoskeletal:     ?   General: Normal range of motion.  ?   Cervical back: Normal range of motion.  ?Skin: ?   General: Skin is warm and dry.  ?Neurological:  ?   Mental Status: She is alert and oriented to person, place, and time.  ?Psychiatric:     ?   Mood and Affect: Mood normal.     ?   Behavior: Behavior normal.  ? ? ?MAU Course  ?Procedures ?No results found for this or any previous visit (from the past 24 hour(s)). ? ?MDM ?Education ?Physical Exam ?Antihypertensives ?Assessment and Plan  ?36 year old ?[id number] at 11.0 weeks ?Hypertension-Likely Chronic ? ?-POC Reviewed. ?-Exam performed. ?-Discussed administration of antihypertensives now. ?-Instructed to continue until able to make an appt with PCP regarding need for continuation. ?-Patient without questions and agreeable with POC. ?-Will give labetalol '100mg'$ . ?-Will monitor blood pressures and then plan for  discharge.  ? ?Maryann Conners ?06/16/2021, 11:12 AM  ? ?Reassessment (1:07 PM) ? ?-BP have improved with labetalol dosing. ?-Instructed to follow up with termination clinic for  rescheduling as desired.  ?-Further instructed to follow up with primary care MD regarding likely [facility] state. ?-Patient verbalizes understanding and without questions. ?-Rx for Labetalol '100mg'$  BID sent to pharmacy on file.  ?-Encouraged to call primary office or return to MAU if symptoms worsen or with the onset of new symptoms. ?-Discharged to home in stable condition. ? ?Maryann Conners MSN, CNM ?Energy manager, [facility] for [employer] ? ? ?

## 2021-06-16 NOTE — Progress Notes (Signed)
Patient arrived from [facility] choice for elected abortion at 62 + weeks. Patient stated that they would not perform procedure due to elevated blood pressures. Patient's paperwork showed her bp was 160/114. Patient stated that she is stressed out.  ?Denies vaginal bleeding, pain, headache, leakage of fluid.  ? ?Labs:UA ?

## 2021-06-19 LAB — HEPATITIS C ANTIBODY: HCV Ab: NEGATIVE

## 2021-06-19 LAB — OB RESULTS CONSOLE HIV ANTIBODY (ROUTINE TESTING): HIV: NONREACTIVE

## 2021-06-19 LAB — OB RESULTS CONSOLE HEPATITIS B SURFACE ANTIGEN: Hepatitis B Surface Ag: NEGATIVE

## 2021-06-19 LAB — OB RESULTS CONSOLE RUBELLA ANTIBODY, IGM: Rubella: IMMUNE

## 2021-06-22 LAB — OB RESULTS CONSOLE GC/CHLAMYDIA
Chlamydia: NEGATIVE
Neisseria Gonorrhea: NEGATIVE

## 2021-10-05 LAB — OB RESULTS CONSOLE RPR: RPR: NONREACTIVE

## 2021-10-15 ENCOUNTER — Observation Stay (HOSPITAL_COMMUNITY)
Admission: AD | Admit: 2021-10-15 | Discharge: 2021-10-16 | Disposition: A | Discharge status: Home / Self Care | Payer: BC Managed Care – PPO | Attending: Obstetrics and Gynecology | Admitting: Obstetrics and Gynecology

## 2021-10-15 ENCOUNTER — Encounter (HOSPITAL_COMMUNITY): Payer: Self-pay

## 2021-10-15 ENCOUNTER — Other Ambulatory Visit: Payer: Self-pay

## 2021-10-15 ENCOUNTER — Inpatient Hospital Stay (HOSPITAL_COMMUNITY): Admit: 2021-10-15 | Payer: BC Managed Care – PPO | Admitting: Obstetrics and Gynecology

## 2021-10-15 ENCOUNTER — Inpatient Hospital Stay (HOSPITAL_BASED_OUTPATIENT_CLINIC_OR_DEPARTMENT_OTHER): Payer: BC Managed Care – PPO

## 2021-10-15 ENCOUNTER — Encounter (HOSPITAL_COMMUNITY): Payer: Self-pay | Admitting: Obstetrics and Gynecology

## 2021-10-15 ENCOUNTER — Other Ambulatory Visit: Payer: Self-pay | Admitting: Obstetrics and Gynecology

## 2021-10-15 DIAGNOSIS — Z3A28 28 weeks gestation of pregnancy: Secondary | ICD-10-CM | POA: Diagnosis not present

## 2021-10-15 DIAGNOSIS — O9A213 Injury, poisoning and certain other consequences of external causes complicating pregnancy, third trimester: Secondary | ICD-10-CM

## 2021-10-15 DIAGNOSIS — Y92410 Unspecified street and highway as the place of occurrence of the external cause: Secondary | ICD-10-CM | POA: Insufficient documentation

## 2021-10-15 DIAGNOSIS — O24410 Gestational diabetes mellitus in pregnancy, diet controlled: Secondary | ICD-10-CM

## 2021-10-15 DIAGNOSIS — T1490XA Injury, unspecified, initial encounter: Secondary | ICD-10-CM | POA: Diagnosis not present

## 2021-10-15 DIAGNOSIS — Z87891 Personal history of nicotine dependence: Secondary | ICD-10-CM | POA: Insufficient documentation

## 2021-10-15 DIAGNOSIS — M549 Dorsalgia, unspecified: Secondary | ICD-10-CM | POA: Diagnosis not present

## 2021-10-15 LAB — CBC
HCT: 32 % — ABNORMAL LOW (ref 36.0–46.0)
Hemoglobin: 10.7 g/dL — ABNORMAL LOW (ref 12.0–15.0)
MCH: 26 pg (ref 26.0–34.0)
MCHC: 33.4 g/dL (ref 30.0–36.0)
MCV: 77.9 fL — ABNORMAL LOW (ref 80.0–100.0)
Platelets: 421 10*3/uL — ABNORMAL HIGH (ref 150–400)
RBC: 4.11 MIL/uL (ref 3.87–5.11)
RDW: 15 % (ref 11.5–15.5)
WBC: 8.6 10*3/uL (ref 4.0–10.5)
nRBC: 0 % (ref 0.0–0.2)

## 2021-10-15 LAB — TYPE AND SCREEN
ABO/RH(D): O POS
Antibody Screen: NEGATIVE

## 2021-10-15 MED ORDER — SODIUM CHLORIDE 0.9% FLUSH: 3.0000 mL | INTRAVENOUS | Status: DC | PRN

## 2021-10-15 MED ORDER — DOCUSATE SODIUM 100 MG PO CAPS
100.0000 mg | ORAL_CAPSULE | Freq: Every day | ORAL | Status: DC
  Filled 2021-10-15: qty 1

## 2021-10-15 MED ORDER — CALCIUM CARBONATE ANTACID 500 MG PO CHEW: 2.0000 | CHEWABLE_TABLET | ORAL | Status: DC | PRN

## 2021-10-15 MED ORDER — SODIUM CHLORIDE 0.9 % IV SOLN: 250.0000 mL | INTRAVENOUS | Status: DC | PRN

## 2021-10-15 MED ORDER — SODIUM CHLORIDE 0.9% FLUSH: 3.0000 mL | Freq: Two times a day (BID) | INTRAVENOUS | Status: DC

## 2021-10-15 MED ORDER — ACETAMINOPHEN 500 MG PO TABS
1000.0000 mg | ORAL_TABLET | Freq: Three times a day (TID) | ORAL | Status: DC | PRN
  Administered 2021-10-15: 1000 mg via ORAL
  Filled 2021-10-15: qty 2

## 2021-10-15 MED ORDER — ZOLPIDEM TARTRATE 5 MG PO TABS: 5.0000 mg | ORAL_TABLET | Freq: Every evening | ORAL | Status: DC | PRN

## 2021-10-15 MED ORDER — PRENATAL MULTIVITAMIN CH
1.0000 | ORAL_TABLET | Freq: Every day | ORAL | Status: DC
  Filled 2021-10-15: qty 1
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
## 2021-10-16 DIAGNOSIS — O9A213 Injury, poisoning and certain other consequences of external causes complicating pregnancy, third trimester: Secondary | ICD-10-CM | POA: Diagnosis not present

## 2021-10-16 LAB — GLUCOSE, CAPILLARY: Glucose-Capillary: 93 mg/dL (ref 70–99)
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
## 2021-10-19 LAB — KLEIHAUER-BETKE STAIN
Fetal Cells %: 0.6 %
Quantitation Fetal Hemoglobin: 0.0026 mL

## 2021-12-10 LAB — OB RESULTS CONSOLE GBS: GBS: NEGATIVE

## 2021-12-14 ENCOUNTER — Telehealth (HOSPITAL_COMMUNITY): Payer: Self-pay

## 2021-12-14 NOTE — Telephone Encounter (Signed)
Preadmission screening

## 2021-12-21 ENCOUNTER — Inpatient Hospital Stay (HOSPITAL_COMMUNITY): Payer: BC Managed Care – PPO

## 2021-12-21 ENCOUNTER — Observation Stay (HOSPITAL_COMMUNITY): Payer: BC Managed Care – PPO | Admitting: Anesthesiology

## 2021-12-21 ENCOUNTER — Inpatient Hospital Stay (HOSPITAL_COMMUNITY): Payer: BC Managed Care – PPO | Admitting: Anesthesiology

## 2021-12-21 ENCOUNTER — Encounter (HOSPITAL_COMMUNITY): Payer: Self-pay | Admitting: Obstetrics and Gynecology

## 2021-12-21 ENCOUNTER — Inpatient Hospital Stay (HOSPITAL_COMMUNITY)
Admission: RE | Admit: 2021-12-21 | Discharge: 2021-12-25 | DRG: 786 | Disposition: A | Discharge status: Home / Self Care | Payer: BC Managed Care – PPO | Attending: Obstetrics and Gynecology | Admitting: Obstetrics and Gynecology

## 2021-12-21 ENCOUNTER — Encounter (HOSPITAL_COMMUNITY)
Admission: RE | Disposition: A | Discharge status: Home / Self Care | Payer: Self-pay | Source: Home / Self Care | Attending: Obstetrics and Gynecology

## 2021-12-21 ENCOUNTER — Other Ambulatory Visit: Payer: Self-pay

## 2021-12-21 ENCOUNTER — Observation Stay (HOSPITAL_COMMUNITY): Payer: BC Managed Care – PPO

## 2021-12-21 DIAGNOSIS — O9081 Anemia of the puerperium: Secondary | ICD-10-CM | POA: Diagnosis not present

## 2021-12-21 DIAGNOSIS — O9852 Other viral diseases complicating childbirth: Secondary | ICD-10-CM | POA: Diagnosis not present

## 2021-12-21 DIAGNOSIS — K831 Obstruction of bile duct: Secondary | ICD-10-CM | POA: Diagnosis present

## 2021-12-21 DIAGNOSIS — Z3A38 38 weeks gestation of pregnancy: Secondary | ICD-10-CM

## 2021-12-21 DIAGNOSIS — A6000 Herpesviral infection of urogenital system, unspecified: Secondary | ICD-10-CM | POA: Diagnosis present

## 2021-12-21 DIAGNOSIS — O9832 Other infections with a predominantly sexual mode of transmission complicating childbirth: Secondary | ICD-10-CM | POA: Diagnosis present

## 2021-12-21 DIAGNOSIS — Z87891 Personal history of nicotine dependence: Secondary | ICD-10-CM | POA: Diagnosis not present

## 2021-12-21 DIAGNOSIS — O139 Gestational [pregnancy-induced] hypertension without significant proteinuria, unspecified trimester: Secondary | ICD-10-CM

## 2021-12-21 DIAGNOSIS — O2662 Liver and biliary tract disorders in childbirth: Secondary | ICD-10-CM | POA: Diagnosis not present

## 2021-12-21 DIAGNOSIS — D62 Acute posthemorrhagic anemia: Secondary | ICD-10-CM | POA: Diagnosis not present

## 2021-12-21 DIAGNOSIS — O24420 Gestational diabetes mellitus in childbirth, diet controlled: Secondary | ICD-10-CM | POA: Diagnosis not present

## 2021-12-21 DIAGNOSIS — O24424 Gestational diabetes mellitus in childbirth, insulin controlled: Secondary | ICD-10-CM | POA: Diagnosis present

## 2021-12-21 DIAGNOSIS — O321XX0 Maternal care for breech presentation, not applicable or unspecified: Principal | ICD-10-CM | POA: Diagnosis present

## 2021-12-21 DIAGNOSIS — O26643 Intrahepatic cholestasis of pregnancy, third trimester: Secondary | ICD-10-CM | POA: Diagnosis present

## 2021-12-21 DIAGNOSIS — O134 Gestational [pregnancy-induced] hypertension without significant proteinuria, complicating childbirth: Secondary | ICD-10-CM | POA: Diagnosis present

## 2021-12-21 DIAGNOSIS — D573 Sickle-cell trait: Secondary | ICD-10-CM | POA: Diagnosis present

## 2021-12-21 DIAGNOSIS — O321XX1 Maternal care for breech presentation, fetus 1: Secondary | ICD-10-CM | POA: Diagnosis not present

## 2021-12-21 LAB — CBC
HCT: 35.2 % — ABNORMAL LOW (ref 36.0–46.0)
Hemoglobin: 11 g/dL — ABNORMAL LOW (ref 12.0–15.0)
MCH: 24.4 pg — ABNORMAL LOW (ref 26.0–34.0)
MCHC: 31.3 g/dL (ref 30.0–36.0)
MCV: 78 fL — ABNORMAL LOW (ref 80.0–100.0)
Platelets: 380 10*3/uL (ref 150–400)
RBC: 4.51 MIL/uL (ref 3.87–5.11)
RDW: 17.9 % — ABNORMAL HIGH (ref 11.5–15.5)
WBC: 7 10*3/uL (ref 4.0–10.5)
nRBC: 0 % (ref 0.0–0.2)

## 2021-12-21 LAB — GLUCOSE, CAPILLARY
Glucose-Capillary: 80 mg/dL (ref 70–99)
Glucose-Capillary: 82 mg/dL (ref 70–99)
Glucose-Capillary: 109 mg/dL — ABNORMAL HIGH (ref 70–99)

## 2021-12-21 LAB — PROTEIN / CREATININE RATIO, URINE
Creatinine, Urine: 45 mg/dL
Total Protein, Urine: <6 mg/dL

## 2021-12-21 LAB — COMPREHENSIVE METABOLIC PANEL
ALT: 15 U/L (ref 0–44)
AST: 20 U/L (ref 15–41)
Albumin: 3 g/dL — ABNORMAL LOW (ref 3.5–5.0)
Alkaline Phosphatase: 199 U/L — ABNORMAL HIGH (ref 38–126)
Anion gap: 13 (ref 5–15)
BUN: 9 mg/dL (ref 6–20)
CO2: 18 mmol/L — ABNORMAL LOW (ref 22–32)
Calcium: 9 mg/dL (ref 8.9–10.3)
Chloride: 107 mmol/L (ref 98–111)
Creatinine, Ser: 0.68 mg/dL (ref 0.44–1.00)
GFR, Estimated: >60 mL/min (ref >60)
Glucose, Bld: 79 mg/dL (ref 70–99)
Potassium: 4 mmol/L (ref 3.5–5.1)
Sodium: 138 mmol/L (ref 135–145)
Total Bilirubin: 0.5 mg/dL (ref 0.3–1.2)
Total Protein: 6.5 g/dL (ref 6.5–8.1)

## 2021-12-21 LAB — TYPE AND SCREEN
ABO/RH(D): O POS
Antibody Screen: NEGATIVE

## 2021-12-21 SURGERY — Surgical Case
Anesthesia: Spinal

## 2021-12-21 MED ORDER — SOD CITRATE-CITRIC ACID 500-334 MG/5ML PO SOLN: 30.0000 mL | ORAL | Status: DC | PRN

## 2021-12-21 MED ORDER — INSULIN ASPART 100 UNIT/ML IJ SOLN
5.0000 [IU] | Freq: Three times a day (TID) | INTRAMUSCULAR | Status: DC
  Administered 2021-12-21 – 2021-12-22 (×2): 5 [IU] via SUBCUTANEOUS

## 2021-12-21 MED ORDER — PHENYLEPHRINE 80 MCG/ML (10ML) SYRINGE FOR IV PUSH (FOR BLOOD PRESSURE SUPPORT): 80.0000 ug | PREFILLED_SYRINGE | INTRAVENOUS | Status: DC | PRN

## 2021-12-21 MED ORDER — BUPIVACAINE HCL (PF) 0.25 % IJ SOLN
INTRAMUSCULAR | Status: DC | PRN
  Administered 2021-12-21: 1 mL via INTRATHECAL

## 2021-12-21 MED ORDER — METFORMIN HCL 500 MG PO TABS
500.0000 mg | ORAL_TABLET | Freq: Two times a day (BID) | ORAL | Status: AC
  Administered 2021-12-21: 500 mg via ORAL
  Filled 2021-12-21: qty 1

## 2021-12-21 MED ORDER — EPHEDRINE 5 MG/ML INJ: 10.0000 mg | INTRAVENOUS | Status: DC | PRN

## 2021-12-21 MED ORDER — FENTANYL-BUPIVACAINE-NACL 0.5-0.125-0.9 MG/250ML-% EP SOLN: 12.0000 mL/h | EPIDURAL | Status: DC | PRN

## 2021-12-21 MED ORDER — INSULIN GLARGINE-YFGN 100 UNIT/ML ~~LOC~~ SOLN
6.0000 [IU] | Freq: Every day | SUBCUTANEOUS | Status: DC
  Administered 2021-12-21: 6 [IU] via SUBCUTANEOUS
  Filled 2021-12-21 (×2): qty 0.06

## 2021-12-21 MED ORDER — DIPHENHYDRAMINE HCL 50 MG/ML IJ SOLN: 12.5000 mg | INTRAMUSCULAR | Status: DC | PRN

## 2021-12-21 MED ORDER — TERBUTALINE SULFATE 1 MG/ML IJ SOLN: 0.2500 mg | Freq: Once | INTRAMUSCULAR | Status: DC | PRN

## 2021-12-21 MED ORDER — VALACYCLOVIR HCL 500 MG PO TABS
1000.0000 mg | ORAL_TABLET | Freq: Every day | ORAL | Status: DC
  Administered 2021-12-21: 1000 mg via ORAL
  Filled 2021-12-21: qty 2

## 2021-12-21 MED ORDER — MINERAL OIL LIGHT OIL
TOPICAL_OIL | Freq: Once | Status: DC
  Filled 2021-12-21 (×2): qty 10

## 2021-12-21 MED ORDER — CALCIUM CARBONATE ANTACID 500 MG PO CHEW: 2.0000 | CHEWABLE_TABLET | ORAL | Status: DC | PRN

## 2021-12-21 MED ORDER — ACETAMINOPHEN 325 MG PO TABS
650.0000 mg | ORAL_TABLET | ORAL | Status: DC | PRN
  Administered 2021-12-21 – 2021-12-22 (×2): 650 mg via ORAL
  Filled 2021-12-21 (×2): qty 2

## 2021-12-21 MED ORDER — LACTATED RINGERS IV SOLN: INTRAVENOUS | Status: DC

## 2021-12-21 MED ORDER — CEFAZOLIN SODIUM-DEXTROSE 2-4 GM/100ML-% IV SOLN
2.0000 g | INTRAVENOUS | Status: DC
  Filled 2021-12-21: qty 100

## 2021-12-21 MED ORDER — ACETAMINOPHEN 325 MG PO TABS: 650.0000 mg | ORAL_TABLET | ORAL | Status: DC | PRN

## 2021-12-21 MED ORDER — ZOLPIDEM TARTRATE 5 MG PO TABS: 5.0000 mg | ORAL_TABLET | Freq: Every evening | ORAL | Status: DC | PRN

## 2021-12-21 MED ORDER — ONDANSETRON HCL 4 MG/2ML IJ SOLN: 4.0000 mg | Freq: Four times a day (QID) | INTRAMUSCULAR | Status: DC | PRN

## 2021-12-21 MED ORDER — LACTATED RINGERS IV SOLN: 500.0000 mL | Freq: Once | INTRAVENOUS | Status: DC

## 2021-12-21 MED ORDER — BUPIVACAINE HCL (PF) 0.25 % IJ SOLN: INTRAMUSCULAR | Status: DC | PRN

## 2021-12-21 MED ORDER — DOCUSATE SODIUM 100 MG PO CAPS: 100.0000 mg | ORAL_CAPSULE | Freq: Every day | ORAL | Status: DC

## 2021-12-21 MED ORDER — OXYTOCIN BOLUS FROM INFUSION: 333.0000 mL | Freq: Once | INTRAVENOUS | Status: DC

## 2021-12-21 MED ORDER — PHENYLEPHRINE 80 MCG/ML (10ML) SYRINGE FOR IV PUSH (FOR BLOOD PRESSURE SUPPORT)
PREFILLED_SYRINGE | INTRAVENOUS | Status: AC
  Filled 2021-12-21: qty 10

## 2021-12-21 MED ORDER — SODIUM CHLORIDE 0.9% FLUSH
3.0000 mL | Freq: Two times a day (BID) | INTRAVENOUS | Status: DC
  Administered 2021-12-21: 3 mL via INTRAVENOUS

## 2021-12-21 MED ORDER — PRENATAL MULTIVITAMIN CH: 1.0000 | ORAL_TABLET | Freq: Every day | ORAL | Status: DC

## 2021-12-21 MED ORDER — LACTATED RINGERS IV SOLN: 500.0000 mL | INTRAVENOUS | Status: DC | PRN

## 2021-12-21 MED ORDER — SOD CITRATE-CITRIC ACID 500-334 MG/5ML PO SOLN: 30.0000 mL | ORAL | Status: DC

## 2021-12-21 MED ORDER — TERBUTALINE SULFATE 1 MG/ML IJ SOLN
INTRAMUSCULAR | Status: AC
  Filled 2021-12-21: qty 1

## 2021-12-21 MED ORDER — TERBUTALINE SULFATE 1 MG/ML IJ SOLN
0.2500 mg | Freq: Once | INTRAMUSCULAR | Status: AC
  Administered 2021-12-21: 0.25 mg via SUBCUTANEOUS

## 2021-12-21 MED ORDER — SODIUM CHLORIDE 0.9% FLUSH: 3.0000 mL | INTRAVENOUS | Status: DC | PRN

## 2021-12-21 MED ORDER — OXYTOCIN-SODIUM CHLORIDE 30-0.9 UT/500ML-% IV SOLN: 1.0000 m[IU]/min | INTRAVENOUS | Status: DC

## 2021-12-21 MED ORDER — SODIUM CHLORIDE 0.9 % IV SOLN: 250.0000 mL | INTRAVENOUS | Status: DC | PRN

## 2021-12-21 MED ORDER — PANTOPRAZOLE SODIUM 40 MG PO TBEC
40.0000 mg | DELAYED_RELEASE_TABLET | Freq: Every day | ORAL | Status: DC
  Administered 2021-12-21: 40 mg via ORAL
  Filled 2021-12-21: qty 1

## 2021-12-21 NOTE — Anesthesia Preprocedure Evaluation (Signed)
Anesthesia Evaluation  Patient identified by MRN, date of birth, ID band Patient awake    Reviewed: Allergy & Precautions, NPO status , Patient's Chart, lab work & pertinent test results  History of Anesthesia Complications Negative for: history of anesthetic complications  Airway Mallampati: II   Neck ROM: Full    Dental  (+) Chipped   Pulmonary former smoker   Pulmonary exam normal        Cardiovascular hypertension, Normal cardiovascular exam     Neuro/Psych  Headaches PSYCHIATRIC DISORDERS Anxiety Depression       GI/Hepatic Neg liver ROS,GERD  Medicated and Controlled,,  Endo/Other  negative endocrine ROS    Renal/GU negative Renal ROS     Musculoskeletal negative musculoskeletal ROS (+)    Abdominal   Peds  Hematology  (+) Blood dyscrasia, Sickle cell trait   Anesthesia Other Findings HSV  Reproductive/Obstetrics (+) Pregnancy  Hx PIH prior pregnancy Breech                               Anesthesia Physical Anesthesia Plan  ASA: 2  Anesthesia Plan: Combined Spinal and Epidural   Post-op Pain Management: Tylenol PO (pre-op)*   Induction:   PONV Risk Score and Plan: 2 and Treatment may vary due to age or medical condition, Ondansetron and Scopolamine patch - Pre-op  Airway Management Planned: Natural Airway  Additional Equipment: None  Intra-op Plan:   Post-operative Plan:   Informed Consent: I have reviewed the patients History and Physical, chart, labs and discussed the procedure including the risks, benefits and alternatives for the proposed anesthesia with the patient or authorized representative who has indicated his/her understanding and acceptance.       Plan Discussed with: CRNA and Anesthesiologist  Anesthesia Plan Comments: (Plan for ECV in PACU. If successful, patient will proceed to L&D for planned vaginal delivery. If unsuccessful, patient will be  taken to OR for C-section today. Labs reviewed. Platelets acceptable, patient not taking any blood thinning medications. Per RN, FHR tracing reported to be stable enough for sitting procedure. Risks and benefits discussed with patient, including PDPH, backache, spinal/epidural hematoma, failed spinal/epidural, blood pressure changes, allergic reaction, and nerve injury. Patient expressed understanding and wished to proceed.  )        Anesthesia Quick Evaluation

## 2021-12-21 NOTE — Anesthesia Procedure Notes (Deleted)
Epidural Patient location during procedure: holding area Start time: 12/21/2021 1:32 PM End time: 12/21/2021 1:36 PM  Staffing Anesthesiologist: Audry Pili, MD Performed: anesthesiologist   Preanesthetic Checklist Completed: patient identified, IV checked, risks and benefits discussed, surgical consent, monitors and equipment checked, pre-op evaluation and timeout performed  Epidural Patient position: sitting Prep: DuraPrep Patient monitoring: continuous pulse ox and blood pressure Approach: midline Location: L3-L4 Injection technique: LOR air  Needle:  Needle type: Tuohy  Needle gauge: 17 G Needle length: 9 cm Needle insertion depth: 7 cm Catheter type: closed end flexible Catheter size: 19 Gauge Catheter at skin depth: 12 cm Epidural test dose: No test dose given due to administration of intrathecal medication.  Assessment Events: blood not aspirated, injection not painful, no injection resistance, no paresthesia and negative IV test  Additional Notes Patient identified. Risks including, but not limited to, bleeding, infection, nerve damage, paralysis, inadequate analgesia, blood pressure changes, nausea, vomiting, allergic reaction, postpartum back pain, itching, and headache were discussed. Patient expressed understanding and wished to proceed. Sterile prep and drape, including hand hygiene, mask, and sterile gloves were used. The patient was positioned and the spine was prepped. The skin was anesthetized with lidocaine. The Tuohy was advanced until LOR was achieved. A 145m 25ga Whitacre needle was advanced through the Tuohy. Free flow of clear CSF was obtained prior to injecting local anesthetic into the CSF. The spinal needle aspirated freely following injection. The spinal needle was carefully withdrawn. The epidural catheter was then advanced into the epidural space via the Tuohy needle. The Tuohy needle was then withdrawn and the epidural catheter was taped into place.  No paraesthesia or other complications noted. The patient tolerated the procedure well.   TRenold Don MDReason for block:at surgeon's request

## 2021-12-21 NOTE — Anesthesia Procedure Notes (Addendum)
Epidural Patient location during procedure: holding area Start time: 12/21/2021 1:32 PM End time: 12/21/2021 1:36 PM  Staffing Anesthesiologist: Audry Pili, MD Performed: anesthesiologist   Preanesthetic Checklist Completed: patient identified, IV checked, risks and benefits discussed, surgical consent, monitors and equipment checked, pre-op evaluation and timeout performed  Epidural Patient position: sitting Prep: DuraPrep Patient monitoring: continuous pulse ox and blood pressure Approach: midline Location: L2-L3 Injection technique: LOR air  Needle:  Needle type: Tuohy  Needle gauge: 17 G Needle length: 9 cm Needle insertion depth: 7 cm Catheter type: closed end flexible Catheter size: 19 Gauge Catheter at skin depth: 12 cm Epidural test dose: No test dose given due to administration of intrathecal medication.  Assessment Events: blood not aspirated, injection not painful, no injection resistance, no paresthesia and negative IV test  Additional Notes Patient identified. Risks including, but not limited to, bleeding, infection, nerve damage, paralysis, inadequate analgesia, blood pressure changes, nausea, vomiting, allergic reaction, postpartum back pain, itching, and headache were discussed. Patient expressed understanding and wished to proceed. Sterile prep and drape, including hand hygiene, mask, and sterile gloves were used. The patient was positioned and the spine was prepped. The skin was anesthetized with lidocaine. The Tuohy was advanced until LOR was achieved. A 134m 25ga Whitacre needle was advanced through the Tuohy. Free flow of clear CSF was obtained prior to injecting local anesthetic into the CSF. The spinal needle aspirated freely following injection. The spinal needle was carefully withdrawn. The epidural catheter was then advanced into the epidural space via the Tuohy needle. The Tuohy needle was then withdrawn and the epidural catheter was taped into place.  No paraesthesia or other complications noted. The patient tolerated the procedure well.   TRenold Don MDReason for block:at surgeon's request and procedure for pain
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
## 2021-12-21 NOTE — Anesthesia Postprocedure Evaluation (Signed)
Anesthesia Post Note  Patient: Toni Warren  Procedure(s) Performed: AN AD [place]     Patient location during evaluation: Mother Baby Anesthesia Type: Epidural Level of consciousness: awake and alert Pain management: pain level controlled Vital Signs Assessment: post-procedure vital signs reviewed and stable Respiratory status: spontaneous breathing, respiratory function stable and nonlabored ventilation Cardiovascular status: blood pressure returned to baseline Postop Assessment: epidural receding and no apparent nausea or vomiting Anesthetic complications: no   No notable events documented.  Last Vitals:  Vitals:   12/21/21 1614 12/21/21 1739  BP: 133/87 131/83  Pulse: 89 96  Resp:    Temp:    SpO2:      Last Pain:  Vitals:   12/21/21 1427  TempSrc: Oral   Pain Goal:                   Audry Pili

## 2021-12-21 NOTE — Progress Notes (Signed)
Call from Labor and delivery RN Jeanann Lewandowsky. Ultrasound was performed prior to induction and fetus was noted to have moved to the breech position. Induction is cancelled.  A/P Breech presntation.  Recommend cesarean section. R/B/A of cesarean section discussed with the patient including but not limited to infection, bleeding damage to bowel bladder and baby with the need for further surgery. R/O transfusion HIV/ Hep B&C discussed. Pt voiced understanding and desires to proceed with cesarean section.   She last ate at 4 pm. Therefore primary cesarean section is scheduled for tomorrow after noon at 4 pm. Pt to be NPO after 8 am in the morning.  Will transfer to [facility] specialty care until delivery tomorrow

## 2021-12-21 NOTE — Procedures (Signed)
External Cephalic Vertion Procedure note.  Epidural was placed prior to performing the cephalic external version. Terbutaline was give [place]. Informed consent was obtained.  Ultrasound was performed and the fetus was noted in the transverse position ( spine anterior ) with head to the maternal left.  Pressure was applied to the fetal head and buttocks and the fetus was guided into the cephalic position.  Patient exhibited moderate discomfort from pressure during the procedure. Cephalic presentation was confirmed by ultrasound. Abdominal binder was placed. Patient was tranferred to [employer]. The fetal heart rate was reassuring and category 1 post procedure.

## 2021-12-21 NOTE — H&P (Signed)
Toni Warren is a 36 y.o. female presenting for external cephalic version due to malpresentation of fetus. 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 AMA and cholestasis of pregnancy.   Prenatal care provided by Dr. Christophe Louis with [facility].  . OB History     Gravida  5   Para  3   Term  3   Preterm  0   AB  1   Living  3      SAB  0   IAB  1   Ectopic  0   Multiple  0   Live Births  3          Past Medical History:  Diagnosis Date   Abdominal hernia    Abnormal Pap smear of cervix 2005   HPV +, 05-26-2018 LGSIL HPV HR+   Anemia    Anxiety    Chlamydia 08/18/2015   repeat testing negative October 2017.   Condyloma acuminatum of vulva    Depression    but doesn't require meds   GERD (gastroesophageal reflux disease)    only takes OTC meds   History of bronchitis 3+yrs ago   History of migraine 09/20/11-last one   HSV-1 (herpes simplex virus 1) infection    HSV-2 (herpes simplex virus 2) infection    Hyperlipidemia    postpartum 2+yrs ago   Low vitamin D level 2018   Migraine    with aura   Pregnancy induced hypertension    previous pregnancy   Sickle cell trait ([place])    Sleeping difficulty    Trichomonas infection 09/2016   Past Surgical History:  Procedure Laterality Date   ABDOMINOPLASTY  09/29/2011   Procedure: ABDOMINOPLASTY;  Surgeon: Theodoro Kos, DO;  Location: [place];  Service: Plastics;  Laterality: N/A;  ABDOMINOPLASTY FOR REPAIR OF RECTUS DIASTASIS   EYE SURGERY  1993   INTRAUTERINE DEVICE (IUD) INSERTION     10-31-13 mirena iud inserted   LEEP  08/2018   CIN II - positive margins.    OVARIAN CYST REMOVAL  2012   VENTRAL HERNIA REPAIR  09/29/2011   Procedure: HERNIA REPAIR VENTRAL ADULT;  Surgeon: Joyice Faster. Cornett, MD;  Location: [facility];  Service: General;  Laterality: N/A;   Family History: family history includes Diabetes in her mother; Hypertension in her father and mother. Social History:   reports that she quit smoking about 15 years ago. Her smoking use included cigars. She has never used smokeless tobacco. She reports that she does not currently use alcohol. She reports that she does not use drugs.     Maternal Diabetes: Yes:  Diabetes Type:  Insulin/Medication controlled Genetic Screening: Normal Maternal Ultrasounds/Referrals: Normal Fetal Ultrasounds or other Referrals:  None Maternal Substance Abuse:  No Significant Maternal Medications:  Meds include: Other:  Significant Maternal Lab Results:  Group B Strep negative Number of Prenatal Visits:greater than 3 verified prenatal visits Other Comments:  None  Review of Systems  Constitutional: Negative.   HENT: Negative.    Eyes: Negative.   Respiratory: Negative.    Cardiovascular: Negative.   Gastrointestinal: Negative.   Endocrine: Negative.   Genitourinary: Negative.   Musculoskeletal: Negative.   Skin: Negative.   Allergic/Immunologic: Negative.   Neurological: Negative.   Hematological: Negative.   Psychiatric/Behavioral: Negative.     History   Height '5\' 2"'$  (1.575 m), weight 91.3 kg, unknown if currently breastfeeding. Maternal Exam:  Introitus: Normal vulva.   Physical Exam  Vitals reviewed.  Constitutional:      Appearance: Normal appearance.  HENT:     Head: Normocephalic and atraumatic.     Nose: Nose normal.     Mouth/Throat:     Mouth: Mucous membranes are moist.  Cardiovascular:     Rate and Rhythm: Normal rate and regular rhythm.  Pulmonary:     Effort: Pulmonary effort is normal.     Breath sounds: Normal breath sounds.  Abdominal:     Tenderness: There is no abdominal tenderness.  Genitourinary:    General: Normal vulva.  Musculoskeletal:        General: No swelling. Normal range of motion.     Cervical back: Normal range of motion and neck supple.  Skin:    General: Skin is warm.  Neurological:     General: No focal deficit present.     Mental Status: She is alert and  oriented to person, place, and time.  Psychiatric:        Mood and Affect: Mood normal.        Behavior: Behavior normal.     Prenatal labs: ABO, Rh: --/--/O POS (08/31 2013) Antibody: NEG (08/31 2013) Rubella: Immune (05/05 0000) RPR: Nonreactive (08/21 0000)  HBsAg: Negative (05/05 0000)  HIV: Non-reactive (05/05 0000)  GBS:   Negative on 12/10/2021  Assessment/Plan: 37 weeks and 6 days with cholestasis of pregnancy. /A2DM/ history of hsv 2 infection. ( No current outbreak)/ Advanced maternal age/ Malpresentation of fetus.  She was offered primary cesarean section vs external cephalic version. She desires external cephalic vertion. D/W pt r/o Non-reassuring fetal heart rate/ abruption/failure and need for primary cesarean section if  the above occur. She voiced understanding and desires to proceed with external cephalic versionl  - hsv 2 ( patient is without signs of outbreak ) she has been on valtrex for suppression.  -A2DM .Marland Kitchen Plan cbg every 4 hours in labor if external cephalic version is successful.  - plan epidural for pain control during version  - Terbutaline for tocolysis during version   Christophe Louis 12/21/2021, 11:09 AM

## 2021-12-22 ENCOUNTER — Inpatient Hospital Stay (HOSPITAL_COMMUNITY): Payer: BC Managed Care – PPO | Admitting: Anesthesiology

## 2021-12-22 ENCOUNTER — Encounter (HOSPITAL_COMMUNITY): Payer: Self-pay | Admitting: Obstetrics and Gynecology

## 2021-12-22 ENCOUNTER — Encounter (HOSPITAL_COMMUNITY)
Admission: RE | Disposition: A | Discharge status: Home / Self Care | Payer: Self-pay | Source: Home / Self Care | Attending: Obstetrics and Gynecology

## 2021-12-22 DIAGNOSIS — O321XX0 Maternal care for breech presentation, not applicable or unspecified: Secondary | ICD-10-CM | POA: Diagnosis present

## 2021-12-22 DIAGNOSIS — O9852 Other viral diseases complicating childbirth: Secondary | ICD-10-CM

## 2021-12-22 DIAGNOSIS — O134 Gestational [pregnancy-induced] hypertension without significant proteinuria, complicating childbirth: Secondary | ICD-10-CM

## 2021-12-22 DIAGNOSIS — O24420 Gestational diabetes mellitus in childbirth, diet controlled: Secondary | ICD-10-CM

## 2021-12-22 DIAGNOSIS — O2662 Liver and biliary tract disorders in childbirth: Secondary | ICD-10-CM

## 2021-12-22 DIAGNOSIS — O321XX1 Maternal care for breech presentation, fetus 1: Secondary | ICD-10-CM

## 2021-12-22 DIAGNOSIS — Z3A38 38 weeks gestation of pregnancy: Secondary | ICD-10-CM

## 2021-12-22 LAB — GLUCOSE, CAPILLARY
Glucose-Capillary: 106 mg/dL — ABNORMAL HIGH (ref 70–99)
Glucose-Capillary: 77 mg/dL (ref 70–99)
Glucose-Capillary: 80 mg/dL (ref 70–99)

## 2021-12-22 LAB — RPR: RPR Ser Ql: NONREACTIVE

## 2021-12-22 SURGERY — Surgical Case
Anesthesia: Spinal

## 2021-12-22 MED ORDER — SODIUM CHLORIDE (PF) 0.9 % IJ SOLN
INTRAMUSCULAR | Status: AC
  Filled 2021-12-22: qty 50

## 2021-12-22 MED ORDER — LACTATED RINGERS IV SOLN: INTRAVENOUS | Status: DC

## 2021-12-22 MED ORDER — BUPIVACAINE HCL (PF) 0.25 % IJ SOLN
INTRAMUSCULAR | Status: AC
  Filled 2021-12-22: qty 30

## 2021-12-22 MED ORDER — DEXAMETHASONE SODIUM PHOSPHATE 4 MG/ML IJ SOLN
INTRAMUSCULAR | Status: AC
  Filled 2021-12-22: qty 2

## 2021-12-22 MED ORDER — POVIDONE-IODINE 10 % EX SWAB
2.0000 | Freq: Once | CUTANEOUS | Status: AC
  Administered 2021-12-22: 2 via TOPICAL

## 2021-12-22 MED ORDER — HYDROCODONE-ACETAMINOPHEN 5-325 MG PO TABS: 1.0000 | ORAL_TABLET | Freq: Once | ORAL | Status: DC | PRN

## 2021-12-22 MED ORDER — SODIUM CHLORIDE 0.9 % IR SOLN
Status: DC | PRN
  Administered 2021-12-22: 1

## 2021-12-22 MED ORDER — ONDANSETRON HCL 4 MG/2ML IJ SOLN: 4.0000 mg | Freq: Once | INTRAMUSCULAR | Status: DC | PRN

## 2021-12-22 MED ORDER — OXYCODONE HCL 5 MG PO TABS: 5.0000 mg | ORAL_TABLET | ORAL | Status: DC | PRN

## 2021-12-22 MED ORDER — FENTANYL CITRATE (PF) 100 MCG/2ML IJ SOLN
INTRAMUSCULAR | Status: AC
  Filled 2021-12-22: qty 2

## 2021-12-22 MED ORDER — DEXAMETHASONE SODIUM PHOSPHATE 10 MG/ML IJ SOLN
INTRAMUSCULAR | Status: DC | PRN
  Administered 2021-12-22: 4 mg via INTRAVENOUS

## 2021-12-22 MED ORDER — BUPIVACAINE LIPOSOME 1.3 % IJ SUSP: 20.0000 mL | Freq: Once | INTRAMUSCULAR | Status: DC

## 2021-12-22 MED ORDER — PHENYLEPHRINE HCL-NACL 20-0.9 MG/250ML-% IV SOLN
INTRAVENOUS | Status: DC | PRN
  Administered 2021-12-22: 30 ug/min via INTRAVENOUS

## 2021-12-22 MED ORDER — OXYTOCIN-SODIUM CHLORIDE 30-0.9 UT/500ML-% IV SOLN
INTRAVENOUS | Status: DC | PRN
  Administered 2021-12-22: 150 mL/h via INTRAVENOUS

## 2021-12-22 MED ORDER — COCONUT OIL OIL: 1.0000 | TOPICAL_OIL | Status: DC | PRN

## 2021-12-22 MED ORDER — PHENYLEPHRINE 80 MCG/ML (10ML) SYRINGE FOR IV PUSH (FOR BLOOD PRESSURE SUPPORT)
PREFILLED_SYRINGE | INTRAVENOUS | Status: AC
  Filled 2021-12-22: qty 10

## 2021-12-22 MED ORDER — ONDANSETRON HCL 4 MG/2ML IJ SOLN
INTRAMUSCULAR | Status: DC | PRN
  Administered 2021-12-22: 4 mg via INTRAVENOUS

## 2021-12-22 MED ORDER — BUPIVACAINE LIPOSOME 1.3 % IJ SUSP
INTRAMUSCULAR | Status: AC
  Filled 2021-12-22: qty 20

## 2021-12-22 MED ORDER — MORPHINE SULFATE (PF) 0.5 MG/ML IJ SOLN
INTRAMUSCULAR | Status: AC
  Filled 2021-12-22: qty 10

## 2021-12-22 MED ORDER — AMISULPRIDE (ANTIEMETIC) 5 MG/2ML IV SOLN: 10.0000 mg | Freq: Once | INTRAVENOUS | Status: DC | PRN

## 2021-12-22 MED ORDER — SENNOSIDES-DOCUSATE SODIUM 8.6-50 MG PO TABS
2.0000 | ORAL_TABLET | Freq: Every day | ORAL | Status: DC
  Administered 2021-12-23 – 2021-12-25 (×3): 2 via ORAL
  Filled 2021-12-22 (×3): qty 2

## 2021-12-22 MED ORDER — MORPHINE SULFATE (PF) 2 MG/ML IV SOLN: 1.0000 mg | INTRAVENOUS | Status: DC | PRN

## 2021-12-22 MED ORDER — CEFAZOLIN SODIUM-DEXTROSE 2-3 GM-%(50ML) IV SOLR
INTRAVENOUS | Status: DC | PRN
  Administered 2021-12-22: 2 g via INTRAVENOUS

## 2021-12-22 MED ORDER — MENTHOL 3 MG MT LOZG: 1.0000 | LOZENGE | OROMUCOSAL | Status: DC | PRN

## 2021-12-22 MED ORDER — SIMETHICONE 80 MG PO CHEW
80.0000 mg | CHEWABLE_TABLET | Freq: Three times a day (TID) | ORAL | Status: DC
  Administered 2021-12-23 – 2021-12-25 (×7): 80 mg via ORAL
  Filled 2021-12-22 (×8): qty 1

## 2021-12-22 MED ORDER — IBUPROFEN 600 MG PO TABS
600.0000 mg | ORAL_TABLET | Freq: Four times a day (QID) | ORAL | Status: DC
  Administered 2021-12-23 – 2021-12-25 (×7): 600 mg via ORAL
  Filled 2021-12-22 (×8): qty 1

## 2021-12-22 MED ORDER — MEPERIDINE HCL 25 MG/ML IJ SOLN: 6.2500 mg | INTRAMUSCULAR | Status: DC | PRN

## 2021-12-22 MED ORDER — ONDANSETRON HCL 4 MG/2ML IJ SOLN
INTRAMUSCULAR | Status: AC
  Filled 2021-12-22: qty 2

## 2021-12-22 MED ORDER — ACETAMINOPHEN 500 MG PO TABS
1000.0000 mg | ORAL_TABLET | Freq: Four times a day (QID) | ORAL | Status: DC
  Administered 2021-12-23 – 2021-12-25 (×8): 1000 mg via ORAL
  Filled 2021-12-22 (×10): qty 2

## 2021-12-22 MED ORDER — DIBUCAINE (PERIANAL) 1 % EX OINT: 1.0000 | TOPICAL_OINTMENT | CUTANEOUS | Status: DC | PRN

## 2021-12-22 MED ORDER — MORPHINE SULFATE (PF) 0.5 MG/ML IJ SOLN
INTRAMUSCULAR | Status: DC | PRN
  Administered 2021-12-22: 150 ug via INTRATHECAL

## 2021-12-22 MED ORDER — STERILE WATER FOR IRRIGATION IR SOLN
Status: DC | PRN
  Administered 2021-12-22: 1

## 2021-12-22 MED ORDER — KETOROLAC TROMETHAMINE 30 MG/ML IJ SOLN
30.0000 mg | Freq: Four times a day (QID) | INTRAMUSCULAR | Status: AC
  Administered 2021-12-23: 30 mg via INTRAVENOUS
  Filled 2021-12-22 (×2): qty 1

## 2021-12-22 MED ORDER — PHENYLEPHRINE HCL-NACL 20-0.9 MG/250ML-% IV SOLN
INTRAVENOUS | Status: AC
  Filled 2021-12-22: qty 250

## 2021-12-22 MED ORDER — HYDROMORPHONE HCL 1 MG/ML IJ SOLN: 0.2500 mg | INTRAMUSCULAR | Status: DC | PRN

## 2021-12-22 MED ORDER — PRENATAL MULTIVITAMIN CH
1.0000 | ORAL_TABLET | Freq: Every day | ORAL | Status: DC
  Filled 2021-12-22 (×3): qty 1

## 2021-12-22 MED ORDER — BUPIVACAINE HCL 0.25 % IJ SOLN
INTRAMUSCULAR | Status: DC | PRN
  Administered 2021-12-22: 30 mL

## 2021-12-22 MED ORDER — KETOROLAC TROMETHAMINE 30 MG/ML IJ SOLN: 30.0000 mg | Freq: Once | INTRAMUSCULAR | Status: DC | PRN

## 2021-12-22 MED ORDER — BUPIVACAINE IN DEXTROSE 0.75-8.25 % IT SOLN
INTRATHECAL | Status: DC | PRN
  Administered 2021-12-22: 1.5 mL via INTRATHECAL

## 2021-12-22 MED ORDER — ZOLPIDEM TARTRATE 5 MG PO TABS: 5.0000 mg | ORAL_TABLET | Freq: Every evening | ORAL | Status: DC | PRN

## 2021-12-22 MED ORDER — METOCLOPRAMIDE HCL 5 MG/ML IJ SOLN
INTRAMUSCULAR | Status: AC
  Filled 2021-12-22: qty 2

## 2021-12-22 MED ORDER — WITCH HAZEL-GLYCERIN EX PADS: 1.0000 | MEDICATED_PAD | CUTANEOUS | Status: DC | PRN

## 2021-12-22 MED ORDER — OXYTOCIN-SODIUM CHLORIDE 30-0.9 UT/500ML-% IV SOLN
2.5000 [IU]/h | INTRAVENOUS | Status: AC
  Administered 2021-12-22: 2.5 [IU]/h via INTRAVENOUS

## 2021-12-22 MED ORDER — SIMETHICONE 80 MG PO CHEW: 80.0000 mg | CHEWABLE_TABLET | ORAL | Status: DC | PRN

## 2021-12-22 MED ORDER — OXYTOCIN-SODIUM CHLORIDE 30-0.9 UT/500ML-% IV SOLN
INTRAVENOUS | Status: AC
  Filled 2021-12-22: qty 500

## 2021-12-22 MED ORDER — DIPHENHYDRAMINE HCL 25 MG PO CAPS
25.0000 mg | ORAL_CAPSULE | Freq: Four times a day (QID) | ORAL | Status: DC | PRN
  Filled 2021-12-22: qty 1

## 2021-12-22 MED ORDER — BUPIVACAINE LIPOSOME 1.3 % IJ SUSP
INTRAMUSCULAR | Status: DC | PRN
  Administered 2021-12-22: 20 mL

## 2021-12-22 MED ORDER — FENTANYL CITRATE (PF) 100 MCG/2ML IJ SOLN
INTRAMUSCULAR | Status: DC | PRN
  Administered 2021-12-22: 15 ug via INTRATHECAL

## 2021-12-22 SURGICAL SUPPLY — 39 items
APL SKNCLS STERI-STRIP NONHPOA (GAUZE/BANDAGES/DRESSINGS) ×1
BENZOIN TINCTURE PRP APPL 2/3 (GAUZE/BANDAGES/DRESSINGS) ×1 IMPLANT
CHLORAPREP W/TINT 26 (MISCELLANEOUS) ×2 IMPLANT
CLAMP UMBILICAL CORD (MISCELLANEOUS) ×1 IMPLANT
CLOTH BEACON ORANGE TIMEOUT ST (SAFETY) ×1 IMPLANT
DRAPE C SECTION CLR SCREEN (DRAPES) ×1 IMPLANT
DRSG OPSITE POSTOP 4X10 (GAUZE/BANDAGES/DRESSINGS) ×1 IMPLANT
ELECT REM PT RETURN 9FT ADLT (ELECTROSURGICAL) ×1
ELECTRODE REM PT RTRN 9FT ADLT (ELECTROSURGICAL) ×1 IMPLANT
EXTRACTOR VACUUM KIWI (MISCELLANEOUS) IMPLANT
GAUZE SPONGE 4X4 12PLY STRL LF (GAUZE/BANDAGES/DRESSINGS) IMPLANT
GLOVE BIO SURGEON STRL SZ 6.5 (GLOVE) ×1 IMPLANT
GLOVE BIOGEL PI IND STRL 7.0 (GLOVE) ×2 IMPLANT
GLOVE SURG SS PI 6.5 STRL IVOR (GLOVE) ×1 IMPLANT
GOWN STRL REUS W/ TWL LRG LVL3 (GOWN DISPOSABLE) ×3 IMPLANT
GOWN STRL REUS W/TWL LRG LVL3 (GOWN DISPOSABLE) ×3
KIT ABG SYR 3ML LUER SLIP (SYRINGE) IMPLANT
MAT PREVALON FULL STRYKER (MISCELLANEOUS) IMPLANT
NDL HYPO 25X5/8 SAFETYGLIDE (NEEDLE) IMPLANT
NEEDLE HYPO 25X5/8 SAFETYGLIDE (NEEDLE) IMPLANT
NS IRRIG 1000ML POUR BTL (IV SOLUTION) ×1 IMPLANT
PACK C SECTION WH (CUSTOM PROCEDURE TRAY) ×1 IMPLANT
PAD ABD 7.5X8 STRL (GAUZE/BANDAGES/DRESSINGS) IMPLANT
PAD OB MATERNITY 4.3X12.25 (PERSONAL CARE ITEMS) ×1 IMPLANT
RTRCTR C-SECT PINK 25CM LRG (MISCELLANEOUS) ×1 IMPLANT
STRIP CLOSURE SKIN 1/2X4 (GAUZE/BANDAGES/DRESSINGS) ×1 IMPLANT
SUT MNCRL 0 VIOLET CTX 36 (SUTURE) ×2 IMPLANT
SUT MNCRL+ AB 3-0 CT1 36 (SUTURE) ×2 IMPLANT
SUT MONOCRYL 0 CTX 36 (SUTURE) ×3
SUT MONOCRYL AB 3-0 CT1 36IN (SUTURE) ×3
SUT PDS AB 0 CTX 36 PDP370T (SUTURE) ×2 IMPLANT
SUT PLAIN 0 NONE (SUTURE) IMPLANT
SUT VIC AB 2-0 CT1 27 (SUTURE)
SUT VIC AB 2-0 CT1 TAPERPNT 27 (SUTURE) IMPLANT
SUT VIC AB 4-0 KS 27 (SUTURE) ×1 IMPLANT
SYR 50ML LL SCALE MARK (SYRINGE) IMPLANT
TOWEL OR 17X24 6PK STRL BLUE (TOWEL DISPOSABLE) ×2 IMPLANT
TRAY FOLEY W/BAG SLVR 14FR LF (SET/KITS/TRAYS/PACK) ×1 IMPLANT
WATER STERILE IRR 1000ML POUR (IV SOLUTION) ×1 IMPLANT

## 2021-12-22 NOTE — Progress Notes (Signed)
In to confirm presentation once more prior to schedule cesarean delivery this afternoon. On bedside US, fetus in breech position. All questions invited and answered. Previously consented for cesarean delivery and blood products - confirmed presence of signed consents.  Drema Dallas, DO

## 2021-12-22 NOTE — Consult Note (Signed)
Neonatology Note:   Attendance at C-section:    I was asked by Dr. Delora Fuel to attend this C/S delivery at 38w for unstable fetal lie after recent successful ECV. 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 AMA and cholestasis of pregnancy. Marland Kitchen  ROM 0h 85mprior to delivery, fluid clear. Infant vigorous with good spontaneous cry and tone. +60 sec DCC done.  Needed minimal bulb suctioning. Lungs clearing to ausc, good tone but poor color change to pink.  SAo2 placed and values low in 60s at ~464m BBO2 provided with great response; removed after 1 minute.  VSS remained stable, good tone, reps effort and pink.  Apgars 8 at 1 minute, 8 at 5 minutes.  Family updated.  To MBU in care of Pediatrician.  DaMonia SabalhKatherina MiresMD Neonatologist 12/22/2021, 6:23 PM

## 2021-12-22 NOTE — Transfer of Care (Signed)
Immediate Anesthesia Transfer of Care Note  Patient: Toni Warren  Procedure(s) Performed: CESAREAN SECTION  Patient Location: PACU  Anesthesia Type:Regional and Spinal  Level of Consciousness: awake, alert , oriented, and patient cooperative  Airway & Oxygen Therapy: Patient Spontanous Breathing  Post-op Assessment: Report given to RN and Post -op Vital signs reviewed and stable  Post vital signs: Reviewed and stable  Last Vitals:  Vitals Value Taken Time  BP 107/68 12/22/21 1826  Temp    Pulse 70 12/22/21 1829  Resp 13 12/22/21 1829  SpO2 96 % 12/22/21 1829  Vitals shown include unvalidated device data.  Last Pain:  Vitals:   12/22/21 1557  TempSrc: Oral  PainSc:          Complications: No notable events documented.

## 2021-12-22 NOTE — Progress Notes (Signed)
Antepartum Progress Note  Subjective: Patient doing well. Awaiting her breakfast - ordered it at 0700 Endorses good FM. Denies VB, LOF, or CTX.  Denies fevers, chills, chest pain, visual changes, SOB, RUQ/epigastric pain, N/V, dysuria, hematuria, or sudden onset/worsening bilateral LE or facial edema.  Objective: BP 130/79 (BP Location: Left Arm)   Pulse 86   Temp 98.2 F (36.8 C) (Oral)   Resp 19   Ht '5\' 2"'$  (1.575 m)   Wt 91.3 kg   SpO2 99%   BMI 36.80 kg/m  Gen:  NAD, pleasant and cooperative Cardio:  RRR Pulm:  CTAB, no wheezes/rales/rhonchi Abd:  Soft, gravid, non-distended, non-tender throughout, no rebound/guarding, fetal head palpated on left side of abdomen Ext:  No bilateral LE edema, no bilateral calf tenderness  FHT (4-5pm on 11/6): 125bpm, moderate variability, + accel, - decel  Results for orders placed or performed during the hospital encounter of 12/21/21  OB RESULT CONSOLE Group B Strep  Result Value Ref Range   GBS Negative   Glucose, capillary  Result Value Ref Range   Glucose-Capillary 80 70 - 99 mg/dL  CBC  Result Value Ref Range   WBC 7.0 4.0 - 10.5 K/uL   RBC 4.51 3.87 - 5.11 MIL/uL   Hemoglobin 11.0 (L) 12.0 - 15.0 g/dL   HCT 35.2 (L) 36.0 - 46.0 %   MCV 78.0 (L) 80.0 - 100.0 fL   MCH 24.4 (L) 26.0 - 34.0 pg   MCHC 31.3 30.0 - 36.0 g/dL   RDW 17.9 (H) 11.5 - 15.5 %   Platelets 380 150 - 400 K/uL   nRBC 0.0 0.0 - 0.2 %  Comprehensive metabolic panel  Result Value Ref Range   Sodium 138 135 - 145 mmol/L   Potassium 4.0 3.5 - 5.1 mmol/L   Chloride 107 98 - 111 mmol/L   CO2 18 (L) 22 - 32 mmol/L   Glucose, Bld 79 70 - 99 mg/dL   BUN 9 6 - 20 mg/dL   Creatinine, Ser 0.68 0.44 - 1.00 mg/dL   Calcium 9.0 8.9 - 10.3 mg/dL   Total Protein 6.5 6.5 - 8.1 g/dL   Albumin 3.0 (L) 3.5 - 5.0 g/dL   AST 20 15 - 41 U/L   ALT 15 0 - 44 U/L   Alkaline Phosphatase 199 (H) 38 - 126 U/L   Total Bilirubin 0.5 0.3 - 1.2 mg/dL   GFR, Estimated >60 >60 mL/min    Anion gap 13 5 - 15  Protein / creatinine ratio, urine  Result Value Ref Range   Creatinine, Urine 45 mg/dL   Total Protein, Urine <6 mg/dL   Protein Creatinine Ratio        0.00 - 0.15 mg/mg[Cre]  Glucose, capillary  Result Value Ref Range   Glucose-Capillary 82 70 - 99 mg/dL  Glucose, capillary  Result Value Ref Range   Glucose-Capillary 109 (H) 70 - 99 mg/dL   Comment 1 Notify RN    Comment 2 Document in Chart   Type and screen [place]  Result Value Ref Range   ABO/RH(D) O POS    Antibody Screen NEG    Sample Expiration      12/24/2021,2359 Performed at [facility], [address] [place], [place] [address]      A/P: Toni Warren is a 36 y.o. [id number] @ 81w0dadmitted for primary CS for unstable fetal lie after successful ECV on 11/6. Pregnancy complicated by AW1XB history of preeclampsia  and HELLP syndrome, HSV-2, AMA, history of LEEP in 2020, SCT, cholestasis of pregnancy.  - Cesarean delivery scheduled for 4pm today - Blood pressures normotensive - There has been a significant delay in her breakfast order - this was addressed with the kitchen, okay to eat now and remain NPO thereafter - BS this AM 106 - Consents obtained on 11/6 for delivery  Drema Dallas, DO

## 2021-12-22 NOTE — Op Note (Signed)
Pre Op Dx:   1. Single live IUP at 67w0d2. Gestational Hypertension 3. Cholestasis of pregnancy 4. Fetal malpresentation (breech) 5. S/p unsuccessful ECV  Post Op Dx:  Same as pre-operative diagnoses  Procedure:   Low Transverse Cesarean Section  Surgeon:  Dr. MWellington Hampshire DDelora FuelAssistants:  Dr. JConcepcion Living(OB Fellow) Anesthesia:  Spinal  EBL:  994cc  IVF:  1000cc UOP:  125cc clear yellow urine  Drains:  Foley catheter  Specimen removed:  Placenta - sent to pathology Device(s) implanted:  None Case Type:  Clean-contaminated Findings: Normal-appearing uterus, bilateral fallopian tubes, and ovaries. Minimal fascial scarring - sutures from previous abdominoplasty visualized. Fetus in breech position. Clear amniotic fluid. APGAR 8/8. Infant weight: 3360g (7lb 6.5oz). Complications: None  Indications:  35y.o. [id number] 342w0dho was admitted for ECV followed by IOL for gestational HTN and cholestasis of pregnancy on 11/7 whose infant returned to breech presentation (unstable fetal lie).  Procedure:  After informed consent was obtained, the patient was brought to the operating room.  Following administration of spinal anesthesia, the patient was positioned in dorsal supine position with a leftward tilt and was prepped and draped in sterile fashion.  A preoperative time-out was performed.  The abdomen was entered in layers through a pfannenstiel incision and a retractor was placed.  A low transverse hysterotomy was created sharply to the level of the membranes, then extended bluntly.  The fetus was delivered from breech presentation onto the field atraumatically in standard fashion.  Bulb suctioning was performed.  The cord was doubly clamped and cut after an approximately 45-60 second pause.  The newborn was passed to the warmer.  The placenta was delivered.  The uterus was swept free of clots and debris and closed in a running locked fashion with 0-Monocryl. A second imbricating  layer was used to close the uterus using 0-Monocryl. An additional figure-of-eight suture was placed mid-hysterotomy for additional hemostasis using 0-Monocryl.  Hemostasis was verified.  The abdomen was irrigated with warmed saline and cleared of clots.  The peritoneum was closed in a running fashion with 2-0 Vicryl.  Subfascial spaces were inspected and hemostasis assured.  The fascia was closed in a running fashion with 0-PDS. Exparel administered inferior to the fascia, superficial to the fascia, and in the subcutaneous tissue.  The subcutaneous tissues were irrigated and hemostasis assured.  The subcutaneous tissues were closed with 2-0 Monocryl.  The skin was closed with 4-0Vicryl.  A sterile bandage was applied.  The patient was transferred to PACU.  All needle, sponge, and instrument counts were correct at the end of the case.    Disposition:  PACU  Comments: I performed the procedure and the assistant was needed due to the complexity of the anatomy. An experienced assistant was required given the standard of surgical care given the complexity of the case.  This assistant was needed for exposure, dissection, suctioning, retraction, instrument exchange, assisting with delivery with administration of fundal pressure, and for overall help during the procedure.    MeDrema DallasDO

## 2021-12-22 NOTE — Anesthesia Preprocedure Evaluation (Addendum)
Anesthesia Evaluation  Patient identified by MRN, date of birth, ID band Patient awake    Reviewed: Allergy & Precautions, NPO status , Patient's Chart, lab work & pertinent test results  Airway Mallampati: I  TM Distance: >3 FB Neck ROM: Full    Dental  (+) Teeth Intact, Dental Advisory Given   Pulmonary former smoker   Pulmonary exam normal breath sounds clear to auscultation       Cardiovascular hypertension (PIH), Normal cardiovascular exam Rhythm:Regular Rate:Normal     Neuro/Psych  Headaches PSYCHIATRIC DISORDERS Anxiety Depression       GI/Hepatic Neg liver ROS,GERD  Medicated and Controlled,,  Endo/Other  diabetes, Well Controlled, Gestational, Oral Hypoglycemic Agents  Obesity BMI 37  Renal/GU negative Renal ROS  negative genitourinary   Musculoskeletal negative musculoskeletal ROS (+)    Abdominal  (+) + obese  Peds negative pediatric ROS (+)  Hematology  (+) Blood dyscrasia, anemia Hb 11, plt 380   Anesthesia Other Findings   Reproductive/Obstetrics (+) Pregnancy Initially breech, successful version yesterday under CSE but then spontaneously converted back to breech while on labor deck. Epidural removed   Multiple prior abdominal procedures: ovarian cyst, central hernia, abdominoplasty  Primary section                              Anesthesia Physical Anesthesia Plan  ASA: 3  Anesthesia Plan: Spinal   Post-op Pain Management: Toradol IV (intra-op)*, Ofirmev IV (intra-op)* and Regional block*   Induction:   PONV Risk Score and Plan: 2 and Propofol infusion and TIVA  Airway Management Planned: Natural Airway and Nasal Cannula  Additional Equipment: None  Intra-op Plan:   Post-operative Plan:   Informed Consent: I have reviewed the patients History and Physical, chart, labs and discussed the procedure including the risks, benefits and alternatives for the proposed  anesthesia with the patient or authorized representative who has indicated his/her understanding and acceptance.       Plan Discussed with: CRNA  Anesthesia Plan Comments:        Anesthesia Quick Evaluation

## 2021-12-22 NOTE — Anesthesia Procedure Notes (Signed)
Spinal  Patient location during procedure: OR Start time: 12/22/2021 5:07 PM End time: 12/22/2021 5:09 PM Reason for block: surgical anesthesia Staffing Performed: anesthesiologist  Anesthesiologist: Pervis Hocking, DO Performed by: Pervis Hocking, DO Authorized by: Pervis Hocking, DO   Preanesthetic Checklist Completed: patient identified, IV checked, risks and benefits discussed, surgical consent, monitors and equipment checked, pre-op evaluation and timeout performed Spinal Block Patient position: sitting Prep: DuraPrep and site prepped and draped Patient monitoring: cardiac monitor, continuous pulse ox and blood pressure Approach: midline Location: L3-4 Injection technique: single-shot Needle Needle type: Pencan  Needle gauge: 24 G Needle length: 9 cm Assessment Sensory level: T6 Events: CSF return Additional Notes Functioning IV was confirmed and monitors were applied. Sterile prep and drape, including hand hygiene and sterile gloves were used. The patient was positioned and the spine was prepped. The skin was anesthetized with lidocaine.  Free flow of clear CSF was obtained prior to injecting local anesthetic into the CSF.  The spinal needle aspirated freely following injection.  The needle was carefully withdrawn.  The patient tolerated the procedure well.

## 2021-12-23 LAB — CBC
HCT: 25.4 % — ABNORMAL LOW (ref 36.0–46.0)
Hemoglobin: 8.3 g/dL — ABNORMAL LOW (ref 12.0–15.0)
MCH: 24.1 pg — ABNORMAL LOW (ref 26.0–34.0)
MCHC: 32.7 g/dL (ref 30.0–36.0)
MCV: 73.6 fL — ABNORMAL LOW (ref 80.0–100.0)
Platelets: 335 10*3/uL (ref 150–400)
RBC: 3.45 MIL/uL — ABNORMAL LOW (ref 3.87–5.11)
RDW: 17.7 % — ABNORMAL HIGH (ref 11.5–15.5)
WBC: 11.9 10*3/uL — ABNORMAL HIGH (ref 4.0–10.5)
nRBC: 0 % (ref 0.0–0.2)

## 2021-12-23 LAB — GLUCOSE, CAPILLARY: Glucose-Capillary: 110 mg/dL — ABNORMAL HIGH (ref 70–99)

## 2021-12-23 MED ORDER — HYDROXYZINE HCL 25 MG PO TABS
25.0000 mg | ORAL_TABLET | Freq: Three times a day (TID) | ORAL | Status: DC | PRN
  Administered 2021-12-23: 25 mg via ORAL
  Filled 2021-12-23: qty 1

## 2021-12-23 MED ORDER — NALOXONE HCL 4 MG/10ML IJ SOLN: 1.0000 ug/kg/h | INTRAVENOUS | Status: DC | PRN

## 2021-12-23 MED ORDER — ACETAMINOPHEN 500 MG PO TABS: 1000.0000 mg | ORAL_TABLET | Freq: Four times a day (QID) | ORAL | Status: DC

## 2021-12-23 MED ORDER — SCOPOLAMINE 1 MG/3DAYS TD PT72: 1.0000 | MEDICATED_PATCH | Freq: Once | TRANSDERMAL | Status: DC

## 2021-12-23 MED ORDER — DIPHENHYDRAMINE HCL 50 MG/ML IJ SOLN: 12.5000 mg | INTRAMUSCULAR | Status: DC | PRN

## 2021-12-23 MED ORDER — DIPHENHYDRAMINE HCL 25 MG PO CAPS: 25.0000 mg | ORAL_CAPSULE | ORAL | Status: DC | PRN

## 2021-12-23 MED ORDER — KETOROLAC TROMETHAMINE 30 MG/ML IJ SOLN: 30.0000 mg | Freq: Four times a day (QID) | INTRAMUSCULAR | Status: AC | PRN

## 2021-12-23 MED ORDER — FERROUS SULFATE 325 (65 FE) MG PO TABS
325.0000 mg | ORAL_TABLET | Freq: Two times a day (BID) | ORAL | Status: DC
  Administered 2021-12-23 – 2021-12-25 (×4): 325 mg via ORAL
  Filled 2021-12-23 (×4): qty 1

## 2021-12-23 MED ORDER — SODIUM CHLORIDE 0.9% FLUSH: 3.0000 mL | INTRAVENOUS | Status: DC | PRN

## 2021-12-23 MED ORDER — ONDANSETRON HCL 4 MG/2ML IJ SOLN: 4.0000 mg | Freq: Three times a day (TID) | INTRAMUSCULAR | Status: DC | PRN

## 2021-12-23 MED ORDER — NALOXONE HCL 0.4 MG/ML IJ SOLN: 0.4000 mg | INTRAMUSCULAR | Status: DC | PRN

## 2021-12-23 NOTE — Progress Notes (Signed)
Postpartum Note Day #1  S:  Patient doing well.  Pain controlled.  Tolerating regular diet.   Ambulating and voiding without difficulty. Reports itching - states Benadryl is not helping much and makes her very sleepy. Asks about prevention of preeclampsia or warning signs for home. Denies fevers, chills, chest pain, SOB, N/V, or worsening bilateral LE edema.  Lochia: Minimal Infant feeding:  Breast Circumcision:  Desires prior to discharge Contraception:  To be discussed at [place]  O: Temp:  [97.6 F (36.4 C)-98.7 F (37.1 C)] 98.7 F (37.1 C) (11/08 0451) Pulse Rate:  [64-82] 74 (11/08 0451) Resp:  [12-20] 17 (11/08 0451) BP: (84-145)/(61-91) 118/71 (11/08 0451) SpO2:  [94 %-100 %] 98 % (11/07 2115) Gen: NAD, pleasant and cooperative Resp: No increased work of breathing Abdomen: soft, non-distended, non-tender throughout Uterus: firm, non-tender, below umbilicus Incision: c/d/i, pressure bandage in place  Ext: No bilateral LE edema, no bilateral calf tenderness, SCDs on and working  Labs:  Recent Labs    12/21/21 1116 12/23/21 0602  HGB 11.0* 8.3*  HCT 35.2* 25.4*    A/P: Patient is a 36 y.o. [id number] POD#1 s/p LTCS.  S/p LTCS - Pain well controlled  - GU: UOP is adequate - GI: Tolerating regular diet - Activity: encouraged sitting up to chair and ambulation as tolerated - DVT Prophylaxis: SCDs in bed, ambulation - Vistaril ordered for itching instead, Bendaryl discontinued - Labs: acute blood loss anemia as documented as above -- oral iron ordered  Gestational HTN - Normotensive postpartum - Counseled patient on initiation of Procardia if blood pressures are 130s/80s - Blood pressures currently 120s/70-82 - Counseled on recommendation for BP monitoring at home daily in the postpartum period - Will arrange BP check in the office within [redacted] week  Gestational DM (A2) - Fasting BS this AM 110 - Repeat fasting blood sugar ordered for the morning  Circumcision  consent: Routine circumcisions performed on newborns have been identified as voluntary, elective procedures by [employer] such as the [employer] of Pediatrics.  It is considered an elective procedure with no definitive medical indication and carries risks.  Risks include but are not limited to bleeding, infection, damage to penis with possible need for further surgery, poor cosmesis, and local anesthetic risks.  Circumcision will only be performed if patient is deemed to have normal anatomy by his Pediatrician, meets adequate criteria for a newborn of similar gestational age after birth and is without infection or other medical issue contraindicating an elective procedure.   Patient understands and agrees with above consent Patient discussed with mother of infant.    Disposition:  D/C home POD#2-3.   Drema Dallas, DO [phone] (office)

## 2021-12-23 NOTE — Progress Notes (Signed)
MOB was referred for history of depression/anxiety. * Referral screened out by Clinical Social Worker because none of the following criteria appear to apply: ~ History of anxiety/depression during this pregnancy, or of post-partum depression following prior delivery. ~ Diagnosis of anxiety and/or depression within last 3 years. Per MOB's records MOB was dx in 2013.   OR * MOB's symptoms currently being treated with medication and/or therapy.  Please contact the Clinical Social Worker if needs arise, by [facility] request, or if MOB scores greater than 9/yes to question 10 on Edinburgh Postpartum Depression Screen.   Laurey Arrow, MSW, LCSW Clinical Social Work [phone]

## 2021-12-23 NOTE — Anesthesia Postprocedure Evaluation (Signed)
Anesthesia Post Note  Patient: Toni Warren  Procedure(s) Performed: [place]     Patient location during evaluation: PACU Anesthesia Type: Spinal Level of consciousness: awake and alert Pain management: pain level controlled Vital Signs Assessment: post-procedure vital signs reviewed and stable Respiratory status: spontaneous breathing, nonlabored ventilation, respiratory function stable and patient connected to nasal cannula oxygen Cardiovascular status: blood pressure returned to baseline and stable Postop Assessment: no apparent nausea or vomiting Anesthetic complications: no  No notable events documented.  Last Vitals:  Vitals:   12/22/21 2349 12/23/21 0451  BP: 124/73 118/71  Pulse: 66 74  Resp: 17 17  Temp: 36.9 C 37.1 C  SpO2:      Last Pain:  Vitals:   12/23/21 0451  TempSrc: Oral  PainSc: 0-No pain   Pain Goal:                   [place]

## 2021-12-24 ENCOUNTER — Encounter (HOSPITAL_COMMUNITY): Payer: Self-pay | Admitting: Obstetrics and Gynecology

## 2021-12-24 LAB — CBC
HCT: 23.9 % — ABNORMAL LOW (ref 36.0–46.0)
Hemoglobin: 7.8 g/dL — ABNORMAL LOW (ref 12.0–15.0)
MCH: 24.5 pg — ABNORMAL LOW (ref 26.0–34.0)
MCHC: 32.6 g/dL (ref 30.0–36.0)
MCV: 75.2 fL — ABNORMAL LOW (ref 80.0–100.0)
Platelets: 321 10*3/uL (ref 150–400)
RBC: 3.18 MIL/uL — ABNORMAL LOW (ref 3.87–5.11)
RDW: 17.7 % — ABNORMAL HIGH (ref 11.5–15.5)
WBC: 9.8 10*3/uL (ref 4.0–10.5)
nRBC: 0 % (ref 0.0–0.2)

## 2021-12-24 LAB — COMPREHENSIVE METABOLIC PANEL
ALT: 16 U/L (ref 0–44)
AST: 20 U/L (ref 15–41)
Albumin: 2.3 g/dL — ABNORMAL LOW (ref 3.5–5.0)
Alkaline Phosphatase: 130 U/L — ABNORMAL HIGH (ref 38–126)
Anion gap: 8 (ref 5–15)
BUN: 8 mg/dL (ref 6–20)
CO2: 24 mmol/L (ref 22–32)
Calcium: 8.6 mg/dL — ABNORMAL LOW (ref 8.9–10.3)
Chloride: 108 mmol/L (ref 98–111)
Creatinine, Ser: 0.8 mg/dL (ref 0.44–1.00)
GFR, Estimated: >60 mL/min (ref >60)
Glucose, Bld: 121 mg/dL — ABNORMAL HIGH (ref 70–99)
Potassium: 4 mmol/L (ref 3.5–5.1)
Sodium: 140 mmol/L (ref 135–145)
Total Bilirubin: 0.1 mg/dL — ABNORMAL LOW (ref 0.3–1.2)
Total Protein: 5.1 g/dL — ABNORMAL LOW (ref 6.5–8.1)

## 2021-12-24 LAB — SURGICAL PATHOLOGY

## 2021-12-24 LAB — LACTATE DEHYDROGENASE: LDH: 162 U/L (ref 98–192)

## 2021-12-24 LAB — GLUCOSE, CAPILLARY: Glucose-Capillary: 94 mg/dL (ref 70–99)

## 2021-12-24 MED ORDER — OXYCODONE HCL 5 MG PO TABS
5.0000 mg | ORAL_TABLET | ORAL | Status: DC | PRN
  Administered 2021-12-24 – 2021-12-25 (×4): 5 mg via ORAL
  Filled 2021-12-24 (×5): qty 1

## 2021-12-24 MED ORDER — SODIUM CHLORIDE 0.9 % IV SOLN
500.0000 mg | Freq: Once | INTRAVENOUS | Status: AC
  Administered 2021-12-24: 500 mg via INTRAVENOUS
  Filled 2021-12-24: qty 500

## 2021-12-24 NOTE — Progress Notes (Signed)
Postpartum Note Day #2  S:  Patient doing well.  Reports pain from the umbilicus to the incision.  Tolerating regular diet.   Ambulating and voiding without difficulty. Itching has resolved. Has not taken Roxicodone because she is afraid to itch. Desires abdominal binder (ordered yesterday).  Denies fevers, chills, chest pain, SOB, N/V, or worsening bilateral LE edema.  Lochia: Minimal Infant feeding:  Breast Circumcision:  Desires prior to discharge Contraception:  To be discussed at [place]  O: Temp:  [97.8 F (36.6 C)-98.3 F (36.8 C)] 98 F (36.7 C) (11/09 0543) Pulse Rate:  [67-91] 91 (11/09 0543) Resp:  [16-18] 16 (11/09 0543) BP: (111-124)/(61-81) 124/75 (11/09 0543) SpO2:  [99 %] 99 % (11/09 0543) Gen: NAD, pleasant and cooperative Resp: No increased work of breathing Abdomen: soft, non-distended, non-tender throughout Uterus: firm, non-tender, below umbilicus Incision: c/d/i, pressure bandage in place  Ext: No bilateral LE edema, no bilateral calf tenderness, SCDs on and working  Labs:  Recent Labs    12/21/21 1116 12/23/21 0602  HGB 11.0* 8.3*  HCT 35.2* 25.4*    A/P: Patient is a 36 y.o. [id number] POD#2 s/p LTCS.  S/p LTCS - Pain not as adequately controlled -- encouraged use of Roxicodone - GU: UOP is adequate - GI: Tolerating regular diet - Activity: encouraged sitting up to chair and ambulation as tolerated - DVT Prophylaxis: SCDs in bed, ambulation - Vistaril ordered for itching PRN - Labs: acute blood loss anemia as documented as above -- oral iron ordered - Circumcision planned today  Gestational HTN - Normotensive postpartum - Counseled patient on initiation of Procardia if blood pressures are 130s/80s - Blood pressures currently 110s-120s/70-82 - Counseled on recommendation for BP monitoring at home daily in the postpartum period - Will arrange BP check in the office within [redacted] week  Gestational DM (A2) - Fasting BS this AM 94 - Repeat fasting blood  sugar ordered for the morning   Disposition:  D/C home today or tomorrow pending adequate pain control.   Drema Dallas, DO [phone] (office)

## 2021-12-24 NOTE — Discharge Summary (Signed)
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 yo [id number] admitted to the hospital 12/21/2021 for scheduled cesarean section with the following indication:Malpresentation.Delivery details are as follows:  Membrane Rupture Time/Date: 5:31 PM ,12/22/2021   Delivery Method:C-Section, Low Transverse  Details of operation can be found in separate operative note.  Patient had a postpartum course complicated by acute blood loss anemia (Hgb 11.0 --> 7.8) for which she received an iron infusion. Due to her history of HELLP syndrome, patient desired lab evaluation prior to discharge -- all unremarkable. PCR on 11/6 was below reportable range. Her blood pressures were normotensive prior to discharge. Pain well-controlled with aid of Roxicodone. She is ambulating, tolerating a regular diet, passing flatus, and urinating well. Patient is discharged home in stable condition on  12/25/21        Newborn Data: Birth date:12/22/2021  Birth  time:5:32 PM  Gender:Female  Living status:Living  Apgars:8 ,8  Weight:3360 g     Magnesium Sulfate received: No BMZ received: No Rhophylac:N/A MMR:N/A T-DaP:Given prenatally Flu: No Transfusion:No  Physical exam  Vitals:   12/24/21 0543 12/24/21 1318 12/24/21 2034 12/25/21 0612  BP: 124/75 119/80 123/74 128/82  Pulse: 91 88 98 80  Resp: _0 Temp: 98 F (36.7 C) 98.7 F (37.1 C) 98.2 F (36.8 C) 97.8 F (36.6 C)  TempSrc: Oral Oral Oral Oral  SpO2: 99% 100% 100% 100%  Weight:      Height:       General: alert, cooperative, and no distress Lochia: appropriate Uterine Fundus: firm Incision: Dressing is clean, dry, and intact DVT Evaluation: No evidence of DVT seen on physical exam. No cords or calf tenderness. Labs: Lab Results  Component Value Date   WBC 9.8 12/24/2021   HGB 7.8 (L) 12/24/2021   HCT 23.9 (L) 12/24/2021   MCV 75.2 (L) 12/24/2021   PLT 321 12/24/2021      Latest Ref Rng & Units 12/24/2021   11:11 AM  CMP  Glucose 70 - 99 mg/dL 121   BUN 6 - 20 mg/dL 8   Creatinine 0.44 - 1.00 mg/dL 0.80   Sodium 135 - 145 mmol/L 140   Potassium 3.5 - 5.1 mmol/L 4.0   Chloride 98 - 111 mmol/L 108   CO2 22 - 32 mmol/L 24   Calcium 8.9 - 10.3 mg/dL 8.6   Total Protein 6.5 - 8.1 g/dL 5.1   Total Bilirubin 0.3 - 1.2 mg/dL 0.1   Alkaline Phos 38 -  126 U/L 130   AST 15 - 41 U/L 20   ALT 0 - 44 U/L 16    Edinburgh Score:    12/24/2021   10:42 AM  Edinburgh Postnatal Depression Scale Screening Tool  I have been able to laugh and see the funny side of things. 0  I have looked forward with enjoyment to things. 0  I have blamed myself unnecessarily when things went wrong. 0  I have been anxious or worried for no good reason. 0  I have felt scared or panicky for no good reason. 1  Things have been getting on top of me. 1  I have been so unhappy that I have had difficulty sleeping. 0  I have felt sad or miserable. 0  I have been so unhappy that I have  been crying. 0  The thought of harming myself has occurred to me. 0  Edinburgh Postnatal Depression Scale Total 2      After visit meds:  Allergies as of 12/25/2021       Reactions   Other Itching   Latex condoms        Medication List     STOP taking these medications    aspirin EC 81 MG tablet   insulin lispro 100 UNIT/ML KwikPen Commonly known as: HUMALOG   Lantus SoloStar 100 UNIT/ML Solostar Pen Generic drug: insulin glargine   metFORMIN 500 MG tablet Commonly known as: GLUCOPHAGE   valACYclovir 1000 MG tablet Commonly known as: VALTREX       TAKE these medications    acetaminophen 500 MG tablet Commonly known as: TYLENOL Take 1,000 mg by mouth every 6 (six) hours as needed for mild pain or headache.   ferrous sulfate 325 (65 FE) MG EC tablet Take 325 mg by mouth daily.   ibuprofen 600 MG tablet Commonly known as: ADVIL Take 1 tablet (600 mg total) by mouth every 6 (six) hours as needed for mild pain, moderate pain or cramping.   oxyCODONE 5 MG immediate release tablet Commonly known as: Roxicodone Take 1 tablet (5 mg total) by mouth every 6 (six) hours as needed for severe pain or breakthrough pain.   pantoprazole 40 MG tablet Commonly known as: PROTONIX Take 40 mg by mouth daily.         Discharge home in stable condition Infant Feeding: Breast Infant Disposition:home with mother Discharge instruction: per After Visit Summary and Postpartum booklet. Activity: Advance as tolerated. Pelvic rest for 6 weeks.  Diet: routine diet Anticipated Birth Control:  To be discussed at [facility] Postpartum Appointment:6 weeks Additional Postpartum F/U: BP check 1 week Future Appointments:No future appointments. Follow up Visit:      12/25/2021 Drema Dallas, DO

## 2021-12-24 NOTE — Progress Notes (Signed)
Late Entry  Primary RN called regarding patient generally feeling weak. Otherwise, pain has been under control today with addition of Roxicodone to her pain regimen. The patient desired to discuss her labs with me, specifically her Hemoglobin levels.   I reviewed her labs as documented below. Patient does not have orthostatic symptoms. Her pain has improved significantly today with Roxicodone and feels more comfortable. I discussed with her that her iron infusion may be beneficial which she agrees. IV Venofer '500mg'$  x 1 dose ordered - risks/benefits reviewed. Blood pressures have ranged 110s-120s/60-80s. Reassurance provided. Plan for D/C home tomorrow.     Latest Ref Rng & Units 12/24/2021   11:11 AM 12/23/2021    6:02 AM 12/21/2021   11:16 AM  CBC  WBC 4.0 - 10.5 K/uL 9.8  11.9  7.0   Hemoglobin 12.0 - 15.0 g/dL 7.8  8.3  11.0   Hematocrit 36.0 - 46.0 % 23.9  25.4  35.2   Platelets 150 - 400 K/uL 321  335  380       Latest Ref Rng & Units 12/24/2021   11:11 AM 12/21/2021   11:16 AM 03/23/2020    8:54 PM  CMP  Glucose 70 - 99 mg/dL 121  79  80   BUN 6 - 20 mg/dL '8  9  12   '$ Creatinine 0.44 - 1.00 mg/dL 0.80  0.68  0.83   Sodium 135 - 145 mmol/L 140  138  138   Potassium 3.5 - 5.1 mmol/L 4.0  4.0  3.8   Chloride 98 - 111 mmol/L 108  107  105   CO2 22 - 32 mmol/L '24  18  22   '$ Calcium 8.9 - 10.3 mg/dL 8.6  9.0  9.4   Total Protein 6.5 - 8.1 g/dL 5.1  6.5  6.1   Total Bilirubin 0.3 - 1.2 mg/dL 0.1  0.5  0.4   Alkaline Phos 38 - 126 U/L 130  199  126   AST 15 - 41 U/L 20  20  37   ALT 0 - 44 U/L '16  15  27    '$ Component Ref Range & Units 11:11 (12/24/21) 1 yr ago (03/19/20) 12 yr ago (09/02/09) 12 yr ago (09/01/09) 12 yr ago (08/31/09) 12 yr ago (08/27/09) 14 yr ago (01/09/07)  LDH 98 - 192 U/L 162 168 CM 384 High  R 339 High  R 365 High  R 244 R 298 High  R   Drema Dallas, DO

## 2021-12-25 MED ORDER — OXYCODONE HCL 5 MG PO TABS: 5.0000 mg | ORAL_TABLET | Freq: Four times a day (QID) | ORAL | 0 refills | Status: AC | PRN

## 2021-12-25 MED ORDER — IBUPROFEN 600 MG PO TABS: 600.0000 mg | ORAL_TABLET | Freq: Four times a day (QID) | ORAL | 1 refills | Status: AC | PRN

## 2021-12-25 NOTE — Lactation Note (Signed)
This note was copied from a baby's chart. Lactation Consultation Note  Patient Name: Toni Warren [id number] Date: 12/25/2021 Reason for consult: Initial assessment;Early term 37-38.6wks Age:36 hours  LC in to visit with P4 Mom of ET infant.  Baby is at an 8% weight loss with good output.  Mom has been exclusively breastfeeding.  Mom had declined [facility] service, but on day of discharge wanted to have LC assess baby's latch.   Mom able to attain a deep latch.  LC placed pillow in Mom's lap to elevate baby, but other than that Mom was able to latch independently.  Baby sucking with deep jaw extensions and swallowing.    Encouraged STS and offering the breast with cues.  Goal of 8-12 feedings per 24 hrs  Reviewed basics and reviewed engorgement prevention and treatment. Mom provided with a resource sheet for OP lactation support and encouraged to call prn  Maternal Data Has patient been taught Hand Expression?: No Does the patient have breastfeeding experience prior to this delivery?: Yes How long did the patient breastfeed?: 12-17 months with 3 previous babies  Feeding Mother's Current Feeding Choice: Breast Milk  LATCH Score Latch: Grasps breast easily, tongue down, lips flanged, rhythmical sucking.  Audible Swallowing: Spontaneous and intermittent  Type of Nipple: Everted at rest and after stimulation  Comfort (Breast/Nipple): Soft / non-tender  Hold (Positioning): Assistance needed to correctly position infant at breast and maintain latch.  LATCH Score: 9   Lactation Tools Discussed/Used Tools: Pump Breast pump type: Manual Reason for Pumping: Mom request Pumping frequency: pre-pump if breast if full  Interventions Interventions: Breast feeding basics reviewed;Assisted with latch;Skin to skin;Breast massage;Hand express;Support pillows;LC Services brochure  Discharge Discharge Education: Engorgement and breast care;Warning signs for feeding baby Pump: DEBP;Hands  Free;Personal  Consult Status Consult Status: Complete Date: 12/25/21 Follow-up type: Call as needed    Broadus John 12/25/2021, 12:28 PM

## 2022-01-01 ENCOUNTER — Telehealth (HOSPITAL_COMMUNITY): Payer: Self-pay | Admitting: *Deleted

## 2022-01-01 NOTE — Telephone Encounter (Signed)
Mom reports feeling good. Incision healing well per mom. No concerns regarding herself at this time. EPDS=0 (hospital score=2) Mom reports baby is well. Feeding, peeing, and pooping without difficulty. Reviewed safe sleep. Mom has no concerns about baby at present.  Odis Hollingshead, RN 01-01-2022 at 10:58am

## 2022-01-06 ENCOUNTER — Encounter (HOSPITAL_COMMUNITY): Payer: Self-pay | Admitting: Obstetrics and Gynecology

## 2022-04-23 ENCOUNTER — Other Ambulatory Visit: Payer: Self-pay | Admitting: Obstetrics & Gynecology

## 2022-04-23 DIAGNOSIS — R102 Pelvic and perineal pain: Secondary | ICD-10-CM
